# Patient Record
Sex: Female | Born: 1935
Health system: Southern US, Community
[De-identification: ages and names within clinical notes are randomized; demographics above are authoritative.]

## PROBLEM LIST (undated history)

## (undated) DIAGNOSIS — F32A Depression, unspecified: Secondary | ICD-10-CM

## (undated) DIAGNOSIS — K5900 Constipation, unspecified: Secondary | ICD-10-CM

## (undated) DIAGNOSIS — N3946 Mixed incontinence: Secondary | ICD-10-CM

## (undated) DIAGNOSIS — Z8489 Family history of other specified conditions: Secondary | ICD-10-CM

## (undated) DIAGNOSIS — K8020 Calculus of gallbladder without cholecystitis without obstruction: Secondary | ICD-10-CM

## (undated) DIAGNOSIS — Z923 Personal history of irradiation: Secondary | ICD-10-CM

## (undated) DIAGNOSIS — R350 Frequency of micturition: Secondary | ICD-10-CM

## (undated) DIAGNOSIS — F419 Anxiety disorder, unspecified: Secondary | ICD-10-CM

## (undated) DIAGNOSIS — M1990 Unspecified osteoarthritis, unspecified site: Secondary | ICD-10-CM

## (undated) DIAGNOSIS — R252 Cramp and spasm: Secondary | ICD-10-CM

## (undated) DIAGNOSIS — N393 Stress incontinence (female) (male): Secondary | ICD-10-CM

## (undated) DIAGNOSIS — M4800 Spinal stenosis, site unspecified: Secondary | ICD-10-CM

## (undated) DIAGNOSIS — E039 Hypothyroidism, unspecified: Secondary | ICD-10-CM

## (undated) DIAGNOSIS — F329 Major depressive disorder, single episode, unspecified: Secondary | ICD-10-CM

## (undated) HISTORY — DX: Anxiety disorder, unspecified: F41.9

## (undated) HISTORY — DX: Stress incontinence (female) (male): N39.3

## (undated) HISTORY — DX: Cramp and spasm: R25.2

## (undated) HISTORY — DX: Depression, unspecified: F32.A

## (undated) HISTORY — DX: Calculus of gallbladder without cholecystitis without obstruction: K80.20

## (undated) HISTORY — PX: COLONOSCOPY: SHX174

## (undated) HISTORY — DX: Unspecified osteoarthritis, unspecified site: M19.90

## (undated) HISTORY — DX: Major depressive disorder, single episode, unspecified: F32.9

## (undated) HISTORY — DX: Spinal stenosis, site unspecified: M48.00

## (undated) HISTORY — DX: Mixed incontinence: N39.46

## (undated) HISTORY — PX: EYE SURGERY: SHX253

## (undated) HISTORY — DX: Hypothyroidism, unspecified: E03.9

## (undated) HISTORY — DX: Frequency of micturition: R35.0

## (undated) HISTORY — PX: DILATION AND CURETTAGE OF UTERUS: SHX78

---

## 1998-04-19 ENCOUNTER — Emergency Department (HOSPITAL_COMMUNITY): Admission: EM | Admit: 1998-04-19 | Discharge: 1998-04-19 | Payer: Self-pay | Admitting: Emergency Medicine

## 1998-08-20 ENCOUNTER — Ambulatory Visit (HOSPITAL_COMMUNITY): Admission: RE | Admit: 1998-08-20 | Discharge: 1998-08-20 | Payer: Self-pay | Admitting: Obstetrics and Gynecology

## 1998-08-20 ENCOUNTER — Encounter: Payer: Self-pay | Admitting: Obstetrics and Gynecology

## 1998-09-16 ENCOUNTER — Other Ambulatory Visit: Admission: RE | Admit: 1998-09-16 | Discharge: 1998-09-16 | Payer: Self-pay | Admitting: Obstetrics and Gynecology

## 2000-06-22 ENCOUNTER — Encounter: Admission: RE | Admit: 2000-06-22 | Discharge: 2000-06-22 | Payer: Self-pay | Admitting: Family Medicine

## 2000-06-22 ENCOUNTER — Encounter: Payer: Self-pay | Admitting: Family Medicine

## 2001-01-01 ENCOUNTER — Ambulatory Visit (HOSPITAL_COMMUNITY): Admission: RE | Admit: 2001-01-01 | Discharge: 2001-01-01 | Payer: Self-pay | Admitting: Gastroenterology

## 2001-02-14 ENCOUNTER — Encounter: Admission: RE | Admit: 2001-02-14 | Discharge: 2001-02-14 | Payer: Self-pay | Admitting: Neurosurgery

## 2001-07-19 ENCOUNTER — Encounter: Payer: Self-pay | Admitting: Orthopaedic Surgery

## 2001-07-19 ENCOUNTER — Encounter: Admission: RE | Admit: 2001-07-19 | Discharge: 2001-07-19 | Payer: Self-pay | Admitting: Orthopaedic Surgery

## 2004-06-05 LAB — CONVERTED CEMR LAB: Pap Smear: NORMAL

## 2005-02-01 ENCOUNTER — Ambulatory Visit (HOSPITAL_COMMUNITY): Admission: RE | Admit: 2005-02-01 | Discharge: 2005-02-01 | Payer: Self-pay | Admitting: Neurosurgery

## 2005-03-24 ENCOUNTER — Encounter: Admission: RE | Admit: 2005-03-24 | Discharge: 2005-03-24 | Payer: Self-pay | Admitting: Neurosurgery

## 2006-08-27 ENCOUNTER — Encounter: Admission: RE | Admit: 2006-08-27 | Discharge: 2006-08-27 | Payer: Self-pay | Admitting: Neurosurgery

## 2006-10-04 HISTORY — PX: BACK SURGERY: SHX140

## 2007-02-22 ENCOUNTER — Ambulatory Visit: Payer: Self-pay | Admitting: Internal Medicine

## 2007-02-22 DIAGNOSIS — K5900 Constipation, unspecified: Secondary | ICD-10-CM | POA: Insufficient documentation

## 2007-02-22 DIAGNOSIS — M810 Age-related osteoporosis without current pathological fracture: Secondary | ICD-10-CM

## 2007-02-22 DIAGNOSIS — R252 Cramp and spasm: Secondary | ICD-10-CM

## 2007-02-22 DIAGNOSIS — E039 Hypothyroidism, unspecified: Secondary | ICD-10-CM

## 2007-02-22 DIAGNOSIS — M4800 Spinal stenosis, site unspecified: Secondary | ICD-10-CM

## 2007-02-22 HISTORY — DX: Cramp and spasm: R25.2

## 2007-03-01 ENCOUNTER — Encounter: Admission: RE | Admit: 2007-03-01 | Discharge: 2007-03-01 | Payer: Self-pay | Admitting: Internal Medicine

## 2007-03-07 ENCOUNTER — Encounter (INDEPENDENT_AMBULATORY_CARE_PROVIDER_SITE_OTHER): Payer: Self-pay | Admitting: *Deleted

## 2007-03-21 ENCOUNTER — Telehealth (INDEPENDENT_AMBULATORY_CARE_PROVIDER_SITE_OTHER): Payer: Self-pay | Admitting: *Deleted

## 2007-04-08 ENCOUNTER — Telehealth: Payer: Self-pay | Admitting: Internal Medicine

## 2007-06-10 ENCOUNTER — Encounter: Admission: RE | Admit: 2007-06-10 | Discharge: 2007-06-10 | Payer: Self-pay | Admitting: Neurosurgery

## 2007-07-11 ENCOUNTER — Encounter: Admission: RE | Admit: 2007-07-11 | Discharge: 2007-07-11 | Payer: Self-pay | Admitting: Neurosurgery

## 2007-10-23 ENCOUNTER — Encounter: Payer: Self-pay | Admitting: Internal Medicine

## 2007-10-29 ENCOUNTER — Ambulatory Visit: Payer: Self-pay | Admitting: Internal Medicine

## 2007-10-29 LAB — CONVERTED CEMR LAB: Blood Glucose, Fingerstick: 95

## 2007-11-05 ENCOUNTER — Telehealth (INDEPENDENT_AMBULATORY_CARE_PROVIDER_SITE_OTHER): Payer: Self-pay | Admitting: *Deleted

## 2007-11-05 LAB — CONVERTED CEMR LAB
Free T4: 1.1 ng/dL (ref 0.6–1.6)
T3 Uptake Ratio: 39.4 % — ABNORMAL HIGH (ref 22.5–37.0)
T3, Free: 2.2 pg/mL — ABNORMAL LOW (ref 2.3–4.2)
TSH: 4.58 microintl units/mL (ref 0.35–5.50)

## 2007-12-12 ENCOUNTER — Ambulatory Visit: Payer: Self-pay | Admitting: Internal Medicine

## 2007-12-12 DIAGNOSIS — M1990 Unspecified osteoarthritis, unspecified site: Secondary | ICD-10-CM | POA: Insufficient documentation

## 2007-12-17 LAB — CONVERTED CEMR LAB
ALT: 18 units/L (ref 0–35)
AST: 20 units/L (ref 0–37)
BUN: 21 mg/dL (ref 6–23)
Basophils Absolute: 0 10*3/uL (ref 0.0–0.1)
Basophils Relative: 0.5 % (ref 0.0–1.0)
CO2: 30 meq/L (ref 19–32)
Calcium: 9.4 mg/dL (ref 8.4–10.5)
Chloride: 103 meq/L (ref 96–112)
Cholesterol: 242 mg/dL (ref 0–200)
Creatinine, Ser: 0.8 mg/dL (ref 0.4–1.2)
Direct LDL: 149.3 mg/dL
Eosinophils Absolute: 0.2 10*3/uL (ref 0.0–0.7)
Eosinophils Relative: 4.8 % (ref 0.0–5.0)
GFR calc Af Amer: 90 mL/min
GFR calc non Af Amer: 75 mL/min
Glucose, Bld: 68 mg/dL — ABNORMAL LOW (ref 70–99)
HCT: 36.2 % (ref 36.0–46.0)
HDL: 78.7 mg/dL (ref >39.0)
Hemoglobin: 12.7 g/dL (ref 12.0–15.0)
Lymphocytes Relative: 26.5 % (ref 12.0–46.0)
MCHC: 34.9 g/dL (ref 30.0–36.0)
MCV: 92.8 fL (ref 78.0–100.0)
Monocytes Absolute: 0.4 10*3/uL (ref 0.1–1.0)
Monocytes Relative: 12.4 % — ABNORMAL HIGH (ref 3.0–12.0)
Neutro Abs: 2 10*3/uL (ref 1.4–7.7)
Neutrophils Relative %: 55.8 % (ref 43.0–77.0)
Platelets: 183 10*3/uL (ref 150–400)
Potassium: 4.5 meq/L (ref 3.5–5.1)
RBC: 3.91 M/uL (ref 3.87–5.11)
RDW: 12.7 % (ref 11.5–14.6)
Sodium: 141 meq/L (ref 135–145)
TSH: 3.54 microintl units/mL (ref 0.35–5.50)
Total CHOL/HDL Ratio: 3.1
Triglycerides: 42 mg/dL (ref 0–149)
VLDL: 8 mg/dL (ref 0–40)
WBC: 3.6 10*3/uL — ABNORMAL LOW (ref 4.5–10.5)

## 2007-12-23 ENCOUNTER — Ambulatory Visit: Payer: Self-pay | Admitting: Internal Medicine

## 2007-12-23 LAB — CONVERTED CEMR LAB
OCCULT 1: NEGATIVE
OCCULT 2: NEGATIVE
OCCULT 3: NEGATIVE

## 2007-12-24 ENCOUNTER — Encounter (INDEPENDENT_AMBULATORY_CARE_PROVIDER_SITE_OTHER): Payer: Self-pay | Admitting: *Deleted

## 2008-02-12 ENCOUNTER — Telehealth (INDEPENDENT_AMBULATORY_CARE_PROVIDER_SITE_OTHER): Payer: Self-pay | Admitting: *Deleted

## 2008-08-11 ENCOUNTER — Ambulatory Visit: Payer: Self-pay | Admitting: Internal Medicine

## 2008-08-11 LAB — CONVERTED CEMR LAB: Glucose, Bld: 109 mg/dL

## 2008-08-15 LAB — CONVERTED CEMR LAB: TSH: 2.29 microintl units/mL (ref 0.35–5.50)

## 2008-08-17 ENCOUNTER — Encounter (INDEPENDENT_AMBULATORY_CARE_PROVIDER_SITE_OTHER): Payer: Self-pay | Admitting: *Deleted

## 2009-06-02 ENCOUNTER — Telehealth (INDEPENDENT_AMBULATORY_CARE_PROVIDER_SITE_OTHER): Payer: Self-pay | Admitting: *Deleted

## 2009-08-09 ENCOUNTER — Ambulatory Visit: Payer: Self-pay | Admitting: Internal Medicine

## 2009-08-10 ENCOUNTER — Encounter: Payer: Self-pay | Admitting: Internal Medicine

## 2009-08-10 LAB — CONVERTED CEMR LAB: Vit D, 25-Hydroxy: 45 ng/mL (ref 30–89)

## 2009-08-12 LAB — CONVERTED CEMR LAB
BUN: 18 mg/dL (ref 6–23)
Basophils Absolute: 0 10*3/uL (ref 0.0–0.1)
Basophils Relative: 0 % (ref 0.0–3.0)
CO2: 31 meq/L (ref 19–32)
Calcium: 9.3 mg/dL (ref 8.4–10.5)
Chloride: 104 meq/L (ref 96–112)
Cholesterol: 219 mg/dL — ABNORMAL HIGH (ref 0–200)
Creatinine, Ser: 0.7 mg/dL (ref 0.4–1.2)
Direct LDL: 125.8 mg/dL
Eosinophils Absolute: 0.2 10*3/uL (ref 0.0–0.7)
Eosinophils Relative: 4.5 % (ref 0.0–5.0)
GFR calc non Af Amer: 86.48 mL/min (ref >60)
Glucose, Bld: 76 mg/dL (ref 70–99)
HCT: 35.7 % — ABNORMAL LOW (ref 36.0–46.0)
HDL: 79.6 mg/dL (ref >39.00)
Hemoglobin: 11.7 g/dL — ABNORMAL LOW (ref 12.0–15.0)
Lymphocytes Relative: 48.2 % — ABNORMAL HIGH (ref 12.0–46.0)
Lymphs Abs: 1.7 10*3/uL (ref 0.7–4.0)
MCHC: 32.9 g/dL (ref 30.0–36.0)
MCV: 93.9 fL (ref 78.0–100.0)
Monocytes Absolute: 0.7 10*3/uL (ref 0.1–1.0)
Monocytes Relative: 19.6 % — ABNORMAL HIGH (ref 3.0–12.0)
Neutro Abs: 1 10*3/uL — ABNORMAL LOW (ref 1.4–7.7)
Neutrophils Relative %: 27.7 % — ABNORMAL LOW (ref 43.0–77.0)
Platelets: 220 10*3/uL (ref 150.0–400.0)
Potassium: 4.2 meq/L (ref 3.5–5.1)
RBC: 3.8 M/uL — ABNORMAL LOW (ref 3.87–5.11)
RDW: 12.6 % (ref 11.5–14.6)
Sodium: 143 meq/L (ref 135–145)
TSH: 2.03 microintl units/mL (ref 0.35–5.50)
Total CHOL/HDL Ratio: 3
Triglycerides: 54 mg/dL (ref 0.0–149.0)
VLDL: 10.8 mg/dL (ref 0.0–40.0)
WBC: 3.6 10*3/uL — ABNORMAL LOW (ref 4.5–10.5)

## 2010-03-23 ENCOUNTER — Telehealth: Payer: Self-pay | Admitting: Internal Medicine

## 2010-03-25 ENCOUNTER — Ambulatory Visit: Payer: Self-pay | Admitting: Internal Medicine

## 2010-03-25 DIAGNOSIS — L989 Disorder of the skin and subcutaneous tissue, unspecified: Secondary | ICD-10-CM | POA: Insufficient documentation

## 2010-03-25 LAB — CONVERTED CEMR LAB
Hemoglobin: 12.1 g/dL (ref 12.0–15.0)
TSH: 2.943 microintl units/mL (ref 0.350–4.500)

## 2010-03-29 ENCOUNTER — Telehealth: Payer: Self-pay | Admitting: Internal Medicine

## 2010-04-13 ENCOUNTER — Encounter: Payer: Self-pay | Admitting: Internal Medicine

## 2010-06-15 ENCOUNTER — Encounter: Admission: RE | Admit: 2010-06-15 | Discharge: 2010-06-15 | Payer: Self-pay | Source: Home / Self Care

## 2010-06-23 ENCOUNTER — Encounter
Admission: RE | Admit: 2010-06-23 | Discharge: 2010-06-23 | Payer: Self-pay | Source: Home / Self Care | Attending: Neurosurgery | Admitting: Neurosurgery

## 2010-06-25 ENCOUNTER — Encounter: Payer: Self-pay | Admitting: Neurosurgery

## 2010-06-26 ENCOUNTER — Encounter: Payer: Self-pay | Admitting: Internal Medicine

## 2010-07-05 NOTE — Progress Notes (Signed)
Summary: HAS APPT FOR BONE DENSITY--NEEDS REFERRAL  Phone Note Call from Patient Call back at Home Phone [phone] Call back at Work Phone [phone]   Caller: Patient Summary of Call: PATIENT HAS MADE AN APPOINTMENT ON 03/31/2010 FOR A BONE DENSITY TEST AT [facility]--SHE HAS HAD BONE DENSITY TEST THERE BEFORE  SHE NEEDS REFERRAL FAXED AS SOON AS POSSIBLE TO--- "[facility]---  FAX # [phone]" Initial call taken by: Jerolyn Shin,  March 23, 2010 10:25 AM  Follow-up for Phone Call        REFERRAL SENT PER PT REQUEST. Follow-up by: Magdalen Spatz [facility],  March 25, 2010 4:09 PM

## 2010-07-05 NOTE — Assessment & Plan Note (Signed)
Summary: cpx/kdc   Vital Signs:  Patient profile:   77 year old female Height:      64 inches Weight:      141.2 pounds BMI:     24.32 Pulse rate:   64 / minute BP sitting:   110 / 60  Vitals Entered By: Shary Decamp (August 09, 2009 1:40 PM) CC: yearly - fasting, pt has concerns about synthorid & osteoporosis   History of Present Illness: OSTEOPENIA  -- takes Ca, Vit D and other OTCs  HYPOTHYROIDISM -- wonders if synthroid cause osteoporosis (no, only if TSH low)   stress incontinence (saw Urology 5-09)-- symptoms are about the same , was Rx therapy (exercises)  but did not go to the class  yearly - fasting, chart reviewed     Current Medications (verified): 1)  Synthroid 75 Mcg  Tabs (Levothyroxine Sodium) .Marland Kitchen.. 1 By Mouth Once Daily 2)  Qualaquin 324 Mg  Caps (Quinine Sulfate) .Marland Kitchen.. 1 By Mouth At Bedtime Prn 3)  Calcium/d  Allergies (verified): 1)  ! Percocet (Oxycodone-Acetaminophen) 2)  ! Codeine  Past History:  Past Medical History: OSTEOPENIA   HYPOTHYROIDISM  SPINAL STENOSIS  G5 P3 miscarriage x 2 borderline DM when she was age 47-47yo stress incontinence (saw Urology 5-09) Osteoarthritis  Past Surgical History: back surgery (5/08) - spinal stenosis DILATION AND CURETTAGE, HX OF   Family History: Reviewed history from 02/22/2007 and no changes required. Father: living 39  yo, lives independently  (has not seen a doctor in 4 years, has rarely seen a doctor) Mother: deceased - CHF, ?MI? breast ca--no colon cancer--no DM-- no  Social History: Married 3 kids tobacco-- never ETOH-- rarely  diet-- healthy for the most part , does like  sweets Exercise-- 3 times a week, goes to the gym Retired  Review of Systems General:  Denies fever and weight loss. CV:  Denies chest pain or discomfort and swelling of feet. Resp:  Denies cough and shortness of breath. GI:  Denies bloody stools, diarrhea, nausea, and vomiting. GU:  Denies dysuria and  hematuria. Psych:  Denies anxiety and depression.  Physical Exam  General:  alert, well-developed, and well-nourished.   Neck:  no masses and no thyromegaly.   Lungs:  normal respiratory effort, no intercostal retractions, no accessory muscle use, and normal breath sounds.   Heart:  normal rate, regular rhythm, no murmur, and no gallop.   Abdomen:  soft, non-tender, no distention, no masses, no guarding, and no rigidity.   Extremities:  no edema Psych:  Cognition and judgment appear intact. Alert and cooperative with normal attention span and concentration, not anxious appearing and not depressed appearing.     Impression & Recommendations:  Problem # 1:  ? of HYPERGLYCEMIA (ICD-790.29) at some point in the past she was told she had borderline diabetes, labs  Orders: TLB-BMP (Basic Metabolic Panel-BMET) (80048-METABOL)  Problem # 2:  HEALTH SCREENING (ICD-V70.0) Td 2003 pneumonia shot 1998 and today not ready for a shingles shot  Last Colonoscopy:  06/05/1998--actual report not available, pt was told it was normal Hemoccults neg 12/2007 pt desires to repeat a Cscope: refer to Dr Loreta Ave   was refered to gyn before , did not go [facility]   02/2007 Pap Smear:  normal (06/05/2004)  she does SBE plan:  refer to gyn (Dr Stefano Gaul) and [facility]  praised for her healthy lifestyle    Orders: Gastroenterology Referral (GI) Radiology Referral (Radiology) Gynecologic Referral (Gyn)  Problem # 3:  OSTEOPENIA (  ICD-733.90)  no recent bone density test, referral for a DEXA  Orders: TLB-CBC Platelet - w/Differential (85025-CBCD) T-Vitamin D (25-Hydroxy) (04540-98119) Radiology Referral (Radiology)  Problem # 4:  HYPOTHYROIDISM (ICD-244.9) labs  Her updated medication list for this problem includes:    Synthroid 75 Mcg Tabs (Levothyroxine sodium) .Marland Kitchen... 1 by mouth once daily  Orders: Venipuncture (14782) TLB-Lipid Panel (80061-LIPID) TLB-TSH (Thyroid Stimulating Hormone)  (84443-TSH)  Problem # 5:  f/u TSH 6 months and OV yearly   Complete Medication List: 1)  Synthroid 75 Mcg Tabs (Levothyroxine sodium) .Marland Kitchen.. 1 by mouth once daily 2)  Qualaquin 324 Mg Caps (Quinine sulfate) .Marland Kitchen.. 1 by mouth at bedtime prn 3)  Calcium/d   Other Orders: Pneumococcal Vaccine (95621) Admin 1st Vaccine (30865)  Patient Instructions: 1)  Please schedule a follow-up appointment in 6 months (labs only)     Preventive Care Screening  Prior Values:    Pap Smear:  normal (06/05/2004)    Mammogram:  normal (06/05/1996)    Colonoscopy:  normal (06/05/1998)    Bone Density:  abnormal (06/05/2004)    Last Tetanus Booster:  Historical (06/05/2001)    Last Pneumovax:  Historical (06/05/1996)    Dexa Interp:  abnormal (06/05/2004)    Past Medical History:    Reviewed history from 12/12/2007 and no changes required:       OSTEOPENIA         HYPOTHYROIDISM        SPINAL STENOSIS        G5 P3 miscarriage x 2       borderline DM when she was age [age]yo       stress incontinence (saw Urology 5-09)       Osteoarthritis  Past Surgical History:    Reviewed history from 10/29/2007 and no changes required:       back surgery (5/08) - spinal stenosis       DILATION AND CURETTAGE, HX OF     Immunizations Administered:  Pneumonia Vaccine:    Vaccine Type: Pneumovax (Medicare)    Site: right deltoid    Mfr: Merck    Dose: 0.5 ml    Route: IM    Given by: Shary Decamp    Exp. Date: 09/23/2010    Lot #: [id number]

## 2010-07-05 NOTE — Assessment & Plan Note (Signed)
Summary: FOLLOWUP, GET THYROID CHECKED///SPH   Vital Signs:  Patient profile:   77 year old female Weight:      139.13 pounds Pulse rate:   76 / minute Pulse rhythm:   regular BP sitting:   126 / 84  (left arm) Cuff size:   regular  Vitals Entered By: Army Fossa CMA (March 25, 2010 2:56 PM) CC: Pt here for f/u visit- check TSH Comments not fasting flu shot  [facility]   History of Present Illness: ROS here for a checkup Doing well  ROS Since May, her sciatic pain has resurfaced, she contacted her neurosurgeon but has not seen him yet, Dr. Channing Mutters; The pain is not severe enough for her to  consider surgery or an MRI, she is taking Advil.  She was referred to gynecology, she had a visit with Dr Stefano Gaul, they did recommend a mammogram, patient is quite reluctant to have a mammogram. She reports a history of 3 previous mammograms in her lifetime. Afraid of radiation.  a skin lesion in her leg has changed.    Current Medications (verified): 1)  Synthroid 75 Mcg  Tabs (Levothyroxine Sodium) .Marland Kitchen.. 1 By Mouth Once Daily 2)  Qualaquin 324 Mg  Caps (Quinine Sulfate) .Marland Kitchen.. 1 By Mouth At Bedtime Prn 3)  Calcium/d  Allergies (verified): 1)  ! Percocet (Oxycodone-Acetaminophen) 2)  ! Codeine  Past History:  Past Medical History: Reviewed history from 08/09/2009 and no changes required. OSTEOPENIA   HYPOTHYROIDISM  SPINAL STENOSIS  G5 P3 miscarriage x 2 borderline DM when she was age [age]yo stress incontinence (saw Urology 5-09) Osteoarthritis  Past Surgical History: Reviewed history from 08/09/2009 and no changes required. back surgery (5/08) - spinal stenosis DILATION AND CURETTAGE, HX OF   Family History: Reviewed history from 08/09/2009 and no changes required. Father: living 79  yo, lives independently  (has not seen a doctor in 4 years, has rarely seen a doctor) Mother: deceased - CHF, ?MI? breast ca--no colon cancer--no DM-- no  Social History: Reviewed  history from 08/09/2009 and no changes required. Married 3 kids tobacco-- never ETOH-- rarely  diet-- healthy for the most part , does like  sweets Exercise-- 3 times a week, goes to the gym Retired  Physical Exam  General:  alert, well-developed, and well-nourished.   Lungs:  normal respiratory effort, no intercostal retractions, no accessory muscle use, and normal breath sounds.   Heart:  normal rate, regular rhythm, no murmur, and no gallop.   Extremities:  no edema Skin:  has several hyperpigmented lesions in the leg, she points to one in the right leg that is about 1 cm, oval in and shape , it  is slightly raised it and scaly.   Impression & Recommendations:  Problem # 1:  HYPOTHYROIDISM (ICD-244.9)  Her updated medication list for this problem includes:    Synthroid 75 Mcg Tabs (Levothyroxine sodium) .Marland Kitchen... 1 by mouth once daily  Labs Reviewed: TSH: 2.03 (08/09/2009)    Chol: 219 (08/09/2009)   HDL: 79.60 (08/09/2009)   LDL: DEL (12/12/2007)   TG: 54.0 (08/09/2009)  Orders: Venipuncture (14481) Specimen Handling (85631)  Problem # 2:  SPINAL STENOSIS (ICD-724.00) see ROS Recommend to contact her neurosurgeon if the pain increases  Problem # 3:  SKIN LESION (ICD-709.9) previously flat skin lesion in the right leg is now raised. We agreed that she will see her dermatologist  Problem # 4:  HEALTH SCREENING (ICD-V70.0) we contacted Dr. Loreta Ave, she is due for her next colonoscopy  in 2012 Her last hemoglobin was slightly decreased, will recheck She saw her gynecologist, he did  recommended a mammogram. Patient reluctant. I again explained the benefits of a mammogram.  Complete Medication List: 1)  Synthroid 75 Mcg Tabs (Levothyroxine sodium) .Marland Kitchen.. 1 by mouth once daily 2)  Qualaquin 324 Mg Caps (Quinine sulfate) .Marland Kitchen.. 1 by mouth at bedtime prn 3)  Calcium/d   Other Orders: Flu Vaccine 25yrs + MEDICARE PATIENTS (N0272) Administration Flu vaccine - MCR (Z3664)  Patient  Instructions: 1)  Please schedule a follow-up appointment in 6 months .    Orders Added: 1)  Flu Vaccine 75yrs + MEDICARE PATIENTS [Q2039] 2)  Administration Flu vaccine - MCR [G0008] 3)  Venipuncture [40347] 4)  Specimen Handling [99000] 5)  Est. Patient Level III [42595] Flu Vaccine Consent Questions     Do you have a history of severe allergic reactions to this vaccine? no    Any prior history of allergic reactions to egg and/or gelatin? no    Do you have a sensitivity to the preservative Thimersol? no    Do you have a past history of Guillan-Barre Syndrome? no    Do you currently have an acute febrile illness? no    Have you ever had a severe reaction to latex? no    Vaccine information given and explained to patient? yes    Are you currently pregnant? no    Lot Number:[id number]   Exp Date:12/03/2010   Site Given  Left Deltoid IMine 43yrs + MEDICARE PATIENTS [Q2039] 2)  Administration Flu vaccine - MCR [G0008]     .[email or web address]

## 2010-07-05 NOTE — Progress Notes (Signed)
Summary: LAB TEST RESULTS AND REFILL  Phone Note Call from Patient Call back at Home Phone [phone]   Caller: Patient Summary of Call: CALLED ABOUT LAST LAB TEST RESULTS--DOES SHE NEED NEW PRESCRIPTION FOR THYROID?      1) CALL HER ABOUT RESULTS 2) PLEASE SEND A PRESCRIPTION REQUEST FOR HER THYROID MEDICATION TO [facility] FOR 90 DAYS PLUS REFILLS Initial call taken by: Jerolyn Shin,  March 29, 2010 12:35 PM  Follow-up for Phone Call        Please advise. Lucious Groves CMA  March 29, 2010 2:23 PM   Additional Follow-up for Phone Call Additional follow up Details #1::        hemoglobin stable, improved compared to last time. TSH normal, continue with same Synthroid dose. call refills if needed Additional Follow-up by: Dorine Duffey E. Kandi Brusseau MD,  March 29, 2010 4:06 PM    Additional Follow-up for Phone Call Additional follow up Details #2::    Patient notified and mailed copy per request. Follow-up by: Lucious Groves CMA,  March 29, 2010 4:36 PM  Prescriptions: SYNTHROID 75 MCG  TABS (LEVOTHYROXINE SODIUM) 1 by mouth once daily  #90 x 1   Entered by:   Lucious Groves CMA   Authorized by:   Nolon Rod. Azim Gillingham MD   Signed by:   Lucious Groves CMA on 03/29/2010   Method used:   Faxed to ...       [facility]* (retail)             ,          Ph: [phone]       Fax: [phone]   RxID:   [id number]

## 2010-07-05 NOTE — Miscellaneous (Signed)
Summary: Orders Update   Clinical Lists Changes  Orders: Added new Referral order of Radiology Referral (Radiology) - Signed 

## 2010-07-09 ENCOUNTER — Encounter: Payer: Self-pay | Admitting: Neurosurgery

## 2010-10-10 ENCOUNTER — Other Ambulatory Visit: Payer: Self-pay | Admitting: Internal Medicine

## 2010-10-21 NOTE — Procedures (Signed)
[place]  Patient:    Marie Haynes, Marie Haynes                        MRN: [id number] Proc. Date: 01/01/01 Adm. Date:  [id number] Attending:  Charna Elizabeth CC:         Juluis Mire, M.D.   Procedure Report  DATE OF BIRTH:  [birth year].  REFERRING PHYSICIAN:  Juluis Mire, M.D.  PROCEDURE PERFORMED:  Colonoscopy.  ENDOSCOPIST:  Anselmo Rod, M.D.  INSTRUMENT USED:  Olympus video pediatric colonoscope.  INDICATIONS FOR PROCEDURE:  Change in bowel habits with guaiac positive stools and worsening constipation in a 77 year old white female.  Rule out colonic polyps, masses, hemorrhoids, etc.  PREPROCEDURE PREPARATION:  Informed consent was procured from the patient. The patient was fasted for eight hours prior to the procedure and prepped with a bottle of magnesium citrate and a gallon of NuLytely the night prior to the procedure.  PREPROCEDURE PHYSICAL:  The patient had stable vital signs.  Neck supple. Chest clear to auscultation.  S1, S2 regular.  Abdomen soft with normal bowel sounds.  DESCRIPTION OF PROCEDURE:  The patient was placed in the left lateral decubitus position and sedated with 40 mg of Demerol and 4 mg of Versed intravenously.  Once the patient was adequately sedated and maintained on low-flow oxygen and continuous cardiac monitoring, the Olympus video colonoscope was advanced from the rectum to the cecum without difficulty.  The patient had a fairly good prep.  There was evidence of melanosis coli throughout the colon with more prominent changes on the right side.  No masses, polyps, erosions, ulcerations or diverticula were seen.  The procedure was completed up to the cecum.  The ileocecal valve and appendiceal orifice were clearly visualized and photographed.  Small nonbleeding internal hemorrhoids were appreciated on retroflexion in the rectum.  IMPRESSION: 1. Severe melanosis coli throughout the colon with more  prominent changes in    the right colon than the left. 2. Small nonbleeding internal hemorrhoids. 3. No evidence of masses, polyps or diverticulosis.  RECOMMENDATIONS: 1. A high fiber diet has been discussed with the patient in great detail. 2. She has been strongly advised to refrain from the use of all laxatives and    to use stool softeners if need be. 3. Outpatient follow-up for repeat guaiac testing. 4. Further recommendations to be made in follow-up in the next four weeks. DD: 01/01/01 TD:  01/01/01 Job: [id number]

## 2010-11-01 ENCOUNTER — Encounter: Payer: Self-pay | Admitting: Internal Medicine

## 2010-11-01 ENCOUNTER — Ambulatory Visit (INDEPENDENT_AMBULATORY_CARE_PROVIDER_SITE_OTHER): Payer: Medicare Other | Admitting: Internal Medicine

## 2010-11-01 DIAGNOSIS — W57XXXA Bitten or stung by nonvenomous insect and other nonvenomous arthropods, initial encounter: Secondary | ICD-10-CM

## 2010-11-01 DIAGNOSIS — J209 Acute bronchitis, unspecified: Secondary | ICD-10-CM

## 2010-11-01 DIAGNOSIS — T148 Other injury of unspecified body region: Secondary | ICD-10-CM

## 2010-11-01 MED ORDER — BENZONATATE 200 MG PO CAPS: 200.0000 mg | ORAL_CAPSULE | Freq: Three times a day (TID) | ORAL | Status: DC | PRN

## 2010-11-01 NOTE — Progress Notes (Signed)
  Subjective:    Patient ID: Marie Haynes, female    DOB: 1931-12-20, 77 y.o.   MRN: [id number]  HPI Respiratory tract infection Onset/symptoms:5/25 as rhinitis & fatigue Exposures (illness/environmental/extrinsic):husband had "cold" Progression of symptoms:cough with thick , cloudy sputum Treatments/response:Doxycycline 05/27 for imbedded tick  Present symptoms:ST from cough Fever/chills/sweats:some chills & sweating Frontal headache:no Facial pain:no Nasal purulence:no Dental pain:no Lymphadenopathy:no Wheezing/shortness of breath:no Pleuritic pain:no Associated extrinsic/allergic symptoms:itchy eyes/ sneezing:only @ onset Past medical history: Seasonal allergies/asthma:asthmatic bronchitis  1991 Smoking history:never           Review of Systems     Objective:   Physical Exam General appearance is of good health and nourishment; no acute distress or increased work of breathing is present.  No  lymphadenopathy about the head, neck, or axilla noted.   Eyes: No conjunctival inflammation or lid edema is present. There is no scleral icterus.  Ears:  External ear exam shows no significant lesions or deformities.  Otoscopic examination reveals clear canals, tympanic membranes are intact bilaterally without bulging, retraction, inflammation or discharge.  Nose:  External nasal examination shows no deformity or inflammation. Nasal mucosa are pink and moist without lesions or exudates. No septal dislocation or dislocation.No obstruction to airflow.   Oral exam: Dental hygiene is good; lips and gums are healthy appearing.There is no oropharyngeal erythema or exudate noted.   Neck:  No deformities, thyromegaly, masses, or tenderness noted.    Decreased  range of motion from DDD  Heart:  Normal rate and regular rhythm. S1 and S2 normal without gallop, murmur, click, rub or other extra sounds.   Lungs:Chest clear to auscultation; no wheezes, rhonchi,rales ,or rubs present.No  increased work of breathing.    Extremities:  No cyanosis, edema, or clubbing  noted    Skin: Warm & dry w/o jaundice or tenting.         Assessment & Plan:  #1 bronchitis  #2 status post tick bite  Plan: #1 Tessalon pearls 2 mg every 6 hours as needed. Zicam Melts for the throat symptoms

## 2010-11-01 NOTE — Patient Instructions (Signed)
Zicam Melts as needed for sore throat. Force non dairy fluids over 48 hrs. Avoid direct sun while on Doxycycline

## 2010-11-09 ENCOUNTER — Telehealth: Payer: Self-pay

## 2010-11-09 MED ORDER — AZITHROMYCIN 250 MG PO TABS: 250.0000 mg | ORAL_TABLET | Freq: Every day | ORAL | Status: AC

## 2010-11-09 MED ORDER — PREDNISONE 20 MG PO TABS: ORAL_TABLET | ORAL | Status: DC

## 2010-11-09 NOTE — Telephone Encounter (Signed)
Spoke w/ pt husband aware of instructions and rx sent to pharmacy

## 2010-11-09 NOTE — Telephone Encounter (Signed)
Pt husband called would like to know what else says wife still has productive cough and hurts to cough was seen 5/29 for bronchitis was already doxy for another issue finished atb 3 days ago and still having hard time w/ cough.   Hop pls advise

## 2010-11-09 NOTE — Telephone Encounter (Signed)
Zpack #1; renew tessalon if needed ; Prednisone 20 mg 1/2 tid #12. CXray  & CBC& dif if no better

## 2011-02-27 ENCOUNTER — Other Ambulatory Visit: Payer: Self-pay | Admitting: Internal Medicine

## 2011-02-27 NOTE — Telephone Encounter (Signed)
Done

## 2011-03-20 ENCOUNTER — Other Ambulatory Visit: Payer: Self-pay | Admitting: Internal Medicine

## 2011-03-20 NOTE — Telephone Encounter (Signed)
Qualaquin request [last refill 02/15/10 #30x2]

## 2011-03-29 NOTE — Telephone Encounter (Signed)
Ok #30, 1 RF Please arrange a OV, due for a CPX

## 2011-05-19 ENCOUNTER — Encounter: Payer: Self-pay | Admitting: Family Medicine

## 2011-05-19 ENCOUNTER — Ambulatory Visit (INDEPENDENT_AMBULATORY_CARE_PROVIDER_SITE_OTHER): Payer: Medicare Other | Admitting: Family Medicine

## 2011-05-19 VITALS — BP 125/75 | HR 80 | Temp 99.0°F | Ht 63.25 in | Wt 135.0 lb

## 2011-05-19 DIAGNOSIS — J111 Influenza due to unidentified influenza virus with other respiratory manifestations: Secondary | ICD-10-CM | POA: Insufficient documentation

## 2011-05-19 MED ORDER — CHLORPHENIRAMINE-HYDROCODONE 8-10 MG/5ML PO LQCR: 5.0000 mL | Freq: Two times a day (BID) | ORAL | Status: DC | PRN

## 2011-05-19 MED ORDER — OSELTAMIVIR PHOSPHATE 75 MG PO CAPS: 75.0000 mg | ORAL_CAPSULE | Freq: Two times a day (BID) | ORAL | Status: AC

## 2011-05-19 NOTE — Progress Notes (Signed)
  Subjective:    Patient ID: Marie Haynes, female    DOB: [birth year], 77 y.o.   MRN: [id number]  HPI Flu like sxs- sxs started Wednesday night w/ 'cold sxs'.  Yesterday developed body aches, HA, burning eyes.  + cough- dry.  + chills.  Denies sick contacts.  Bilateral ear fullness.  + maxillary sinus pressure.   Review of Systems For ROS see HPI     Objective:   Physical Exam  Constitutional: She appears well-developed and well-nourished. No distress.  HENT:  Head: Normocephalic and atraumatic.       TMs normal bilaterally Mild nasal congestion Throat w/out erythema, edema, or exudate No TTP over sinuses  Eyes: Conjunctivae and EOM are normal. Pupils are equal, round, and reactive to light.  Neck: Normal range of motion. Neck supple.  Cardiovascular: Normal rate, regular rhythm, normal heart sounds and intact distal pulses.   No murmur heard. Pulmonary/Chest: Effort normal and breath sounds normal. No respiratory distress. She has no wheezes.       + dry cough  Lymphadenopathy:    She has no cervical adenopathy.          Assessment & Plan:

## 2011-05-19 NOTE — Patient Instructions (Signed)
You have the Flu Start the Tamiflu tonight Use the Tussionex for cough Drink plenty of fluids REST! Hang in there! Happy Holidays!

## 2011-05-21 NOTE — Assessment & Plan Note (Signed)
+   flu test.  No current respiratory distress.  Start Tamiflu.  Reviewed supportive care and red flags that should prompt return.  Pt expressed understanding and is in agreement w/ plan.

## 2011-05-22 LAB — POCT INFLUENZA A/B
Influenza A, POC: POSITIVE
Influenza B, POC: POSITIVE

## 2011-05-31 ENCOUNTER — Encounter (HOSPITAL_BASED_OUTPATIENT_CLINIC_OR_DEPARTMENT_OTHER): Payer: Self-pay | Admitting: *Deleted

## 2011-05-31 ENCOUNTER — Emergency Department (HOSPITAL_BASED_OUTPATIENT_CLINIC_OR_DEPARTMENT_OTHER)
Admission: EM | Admit: 2011-05-31 | Discharge: 2011-06-01 | Disposition: A | Discharge status: Home / Self Care | Payer: Medicare Other | Attending: Emergency Medicine | Admitting: Emergency Medicine

## 2011-05-31 DIAGNOSIS — M542 Cervicalgia: Secondary | ICD-10-CM | POA: Insufficient documentation

## 2011-05-31 DIAGNOSIS — Y92410 Unspecified street and highway as the place of occurrence of the external cause: Secondary | ICD-10-CM | POA: Insufficient documentation
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
## 2011-06-08 DIAGNOSIS — L821 Other seborrheic keratosis: Secondary | ICD-10-CM | POA: Diagnosis not present

## 2011-06-08 DIAGNOSIS — D239 Other benign neoplasm of skin, unspecified: Secondary | ICD-10-CM | POA: Diagnosis not present

## 2011-06-08 DIAGNOSIS — L609 Nail disorder, unspecified: Secondary | ICD-10-CM | POA: Diagnosis not present

## 2011-06-08 DIAGNOSIS — D485 Neoplasm of uncertain behavior of skin: Secondary | ICD-10-CM | POA: Diagnosis not present

## 2011-06-09 DIAGNOSIS — M542 Cervicalgia: Secondary | ICD-10-CM | POA: Diagnosis not present

## 2011-06-09 DIAGNOSIS — M25519 Pain in unspecified shoulder: Secondary | ICD-10-CM | POA: Diagnosis not present

## 2011-06-13 DIAGNOSIS — M25519 Pain in unspecified shoulder: Secondary | ICD-10-CM | POA: Diagnosis not present

## 2011-06-13 DIAGNOSIS — M542 Cervicalgia: Secondary | ICD-10-CM | POA: Diagnosis not present

## 2011-06-14 DIAGNOSIS — M25519 Pain in unspecified shoulder: Secondary | ICD-10-CM | POA: Diagnosis not present

## 2011-06-14 DIAGNOSIS — M542 Cervicalgia: Secondary | ICD-10-CM | POA: Diagnosis not present

## 2011-06-19 ENCOUNTER — Ambulatory Visit: Payer: Medicare Other

## 2011-06-20 DIAGNOSIS — M545 Low back pain: Secondary | ICD-10-CM | POA: Diagnosis not present

## 2011-06-20 DIAGNOSIS — M542 Cervicalgia: Secondary | ICD-10-CM | POA: Diagnosis not present

## 2011-06-21 DIAGNOSIS — M545 Low back pain: Secondary | ICD-10-CM | POA: Diagnosis not present

## 2011-06-22 DIAGNOSIS — M542 Cervicalgia: Secondary | ICD-10-CM | POA: Diagnosis not present

## 2011-06-22 DIAGNOSIS — L738 Other specified follicular disorders: Secondary | ICD-10-CM | POA: Diagnosis not present

## 2011-06-22 DIAGNOSIS — M545 Low back pain: Secondary | ICD-10-CM | POA: Diagnosis not present

## 2011-06-23 ENCOUNTER — Encounter: Payer: Self-pay | Admitting: Internal Medicine

## 2011-06-23 ENCOUNTER — Ambulatory Visit (INDEPENDENT_AMBULATORY_CARE_PROVIDER_SITE_OTHER): Payer: Medicare Other | Admitting: Internal Medicine

## 2011-06-23 VITALS — BP 118/70 | HR 75 | Temp 98.2°F | Wt 132.0 lb

## 2011-06-23 DIAGNOSIS — F329 Major depressive disorder, single episode, unspecified: Secondary | ICD-10-CM

## 2011-06-23 DIAGNOSIS — F341 Dysthymic disorder: Secondary | ICD-10-CM | POA: Diagnosis not present

## 2011-06-23 DIAGNOSIS — E039 Hypothyroidism, unspecified: Secondary | ICD-10-CM | POA: Diagnosis not present

## 2011-06-23 LAB — TSH: TSH: 1.951 u[IU]/mL (ref 0.350–4.500)

## 2011-06-23 MED ORDER — FLUOXETINE HCL 20 MG PO TABS: 20.0000 mg | ORAL_TABLET | Freq: Every day | ORAL | Status: DC

## 2011-06-23 NOTE — Assessment & Plan Note (Signed)
Labs , RF

## 2011-06-23 NOTE — Progress Notes (Signed)
  Subjective:    Patient ID: Marie Haynes, female    DOB: 30-Apr-1932, 78 y.o.   MRN: [id number]  HPI ROV Here for a hypothyroidism checkup. Good medication compliance. Needs labs. Also, complaining of anxiety and depression. This is going on  for a while, anxiety is episodic, usually once a week, symptoms are not severe but she becomes very irritable and snap at her husband. Depression is described as mild to moderate, feeling blue most days. She reports she knows exactly why she feels that way, has some issues with her children behavior and lifestyle. Has gone through psychotherapy before.  Past Medical History: Hypothyroidism Anxiety and depression   Osteopenia    Spinal stenosis G5 P3 miscarriage x 2 borderline DM when she was age [age]yo stress incontinence (saw Urology 5-09) Osteoarthritis  Past Surgical History: back surgery (5/08) - spinal stenosis S/P D/C   Social History: Married, 3 kids tobacco-- never ETOH-- rarely   Review of Systems No suicidal ideas. Good compliance with Fosamax without any dysphasia or chest pain. Occasionally feels fatigued during the day. No problems with insomnia.      Objective:   Physical Exam  Constitutional: She is oriented to person, place, and time. She appears well-developed and well-nourished.  HENT:  Head: Normocephalic and atraumatic.  Neck: No thyromegaly present.  Cardiovascular: Normal rate and regular rhythm.   No murmur heard. Pulmonary/Chest: Effort normal and breath sounds normal. No respiratory distress. She has no wheezes. She has no rales.  Musculoskeletal: She exhibits no edema.  Neurological: She is alert and oriented to person, place, and time.  Psychiatric: She has a normal mood and affect. Her behavior is normal. Judgment and thought content normal.      Assessment & Plan:  Today , I spent more than 25 min with the patient, >50% of the time counseling, see assessment and plan

## 2011-06-23 NOTE — Assessment & Plan Note (Addendum)
New problem, c/o episodic anxiety, depression described as mild to moderate We discussed different modalities of treatment including counseling, benzos prn and SSRIs Pt elected SSRI, i think prozac is a good choice as she is slt fatigue during the day time and that may be a sign of depression. She is also counseled about the issues that make her sad-anxious  Encouraged to restart counseling  Reassess in 6 weeks

## 2011-06-23 NOTE — Patient Instructions (Signed)
Fluoxetine 20 mg: 1/2 tablet a day x 10 days, then 1 tablet a day Counseling!

## 2011-06-25 ENCOUNTER — Encounter: Payer: Self-pay | Admitting: Internal Medicine

## 2011-06-27 ENCOUNTER — Encounter: Payer: Self-pay | Admitting: Internal Medicine

## 2011-06-27 DIAGNOSIS — M542 Cervicalgia: Secondary | ICD-10-CM | POA: Diagnosis not present

## 2011-06-27 DIAGNOSIS — M545 Low back pain: Secondary | ICD-10-CM | POA: Diagnosis not present

## 2011-06-29 DIAGNOSIS — M542 Cervicalgia: Secondary | ICD-10-CM | POA: Diagnosis not present

## 2011-06-29 DIAGNOSIS — M545 Low back pain: Secondary | ICD-10-CM | POA: Diagnosis not present

## 2011-07-04 DIAGNOSIS — M542 Cervicalgia: Secondary | ICD-10-CM | POA: Diagnosis not present

## 2011-07-05 DIAGNOSIS — H2640 Unspecified secondary cataract: Secondary | ICD-10-CM | POA: Diagnosis not present

## 2011-07-07 DIAGNOSIS — M25519 Pain in unspecified shoulder: Secondary | ICD-10-CM | POA: Diagnosis not present

## 2011-07-07 DIAGNOSIS — M542 Cervicalgia: Secondary | ICD-10-CM | POA: Diagnosis not present

## 2011-07-11 DIAGNOSIS — M542 Cervicalgia: Secondary | ICD-10-CM | POA: Diagnosis not present

## 2011-07-11 DIAGNOSIS — M545 Low back pain: Secondary | ICD-10-CM | POA: Diagnosis not present

## 2011-07-13 DIAGNOSIS — M545 Low back pain: Secondary | ICD-10-CM | POA: Diagnosis not present

## 2011-07-13 DIAGNOSIS — M542 Cervicalgia: Secondary | ICD-10-CM | POA: Diagnosis not present

## 2011-08-04 ENCOUNTER — Ambulatory Visit (INDEPENDENT_AMBULATORY_CARE_PROVIDER_SITE_OTHER): Payer: Medicare Other | Admitting: Internal Medicine

## 2011-08-04 VITALS — BP 120/76 | HR 61 | Temp 97.9°F | Wt 131.0 lb

## 2011-08-04 DIAGNOSIS — F341 Dysthymic disorder: Secondary | ICD-10-CM

## 2011-08-04 DIAGNOSIS — F329 Major depressive disorder, single episode, unspecified: Secondary | ICD-10-CM

## 2011-08-04 MED ORDER — ESCITALOPRAM OXALATE 10 MG PO TABS: 15.0000 mg | ORAL_TABLET | Freq: Every day | ORAL | Status: DC

## 2011-08-04 NOTE — Progress Notes (Signed)
  Subjective:    Patient ID: Marie Haynes, female    DOB: Oct 22, 1931, 78 y.o.   MRN: [id number]  HPI Followup from previous visit, she reported anxiety and depression. Was started on Prozac. Good compliance, denies any side effects however it has not changed her symptoms at all.   Past Medical History:  Hypothyroidism  Anxiety and depression  Osteopenia  Spinal stenosis  G5 P3 miscarriage x 2  borderline DM when she was age [age]yo  stress incontinence (saw Urology 5-09)  Osteoarthritis  Past Surgical History:  back surgery (5/08) - spinal stenosis  S/P D/C  Social History:  Married, 3 kids  tobacco-- never  ETOH-- rarely    Review of Systems Continue with on and off anxiety- depression, mild fatigue.no suicidal ideas  Denies any nausea, vomiting or diarrhea. Does not feel too sleepy or too awake.    Objective:   Physical Exam  Alert oriented x3, no apparent distress. Psych: No evidence of a site of depression. Coherent, cooperative      Assessment & Plan:

## 2011-08-04 NOTE — Patient Instructions (Signed)
lexapro 10 mg 1 a day x 1 week, then 1.5 tabs a day Came back in 2 months  Call if problems

## 2011-08-06 ENCOUNTER — Encounter: Payer: Self-pay | Admitting: Internal Medicine

## 2011-08-06 NOTE — Assessment & Plan Note (Signed)
Patient started on Prozac for anxiety-depression, she experienced no side effects but also has not improved at all. Options are to increase the Prozac dose or change to another medication. Because she has not improved at all with 20 mg of Prozac I  recommend a different agent.  Prescription for Lexapro provided. She knows to call me if  side effects. If she does not respond well to the second SSRI, we'll have to reconsider our diagnosis and consider a psych referral

## 2011-08-28 DIAGNOSIS — F411 Generalized anxiety disorder: Secondary | ICD-10-CM | POA: Diagnosis not present

## 2011-08-28 DIAGNOSIS — F918 Other conduct disorders: Secondary | ICD-10-CM | POA: Diagnosis not present

## 2011-09-04 ENCOUNTER — Other Ambulatory Visit: Payer: Self-pay | Admitting: Internal Medicine

## 2011-09-04 NOTE — Telephone Encounter (Signed)
Refill done.  

## 2011-10-04 ENCOUNTER — Ambulatory Visit: Payer: Medicare Other | Admitting: Internal Medicine

## 2011-11-10 ENCOUNTER — Other Ambulatory Visit: Payer: Self-pay | Admitting: Internal Medicine

## 2011-11-10 NOTE — Telephone Encounter (Signed)
Refill done.  

## 2011-12-27 ENCOUNTER — Encounter: Payer: Self-pay | Admitting: Internal Medicine

## 2011-12-27 ENCOUNTER — Other Ambulatory Visit: Payer: Medicare Other

## 2011-12-27 ENCOUNTER — Ambulatory Visit (INDEPENDENT_AMBULATORY_CARE_PROVIDER_SITE_OTHER): Payer: Medicare Other | Admitting: Internal Medicine

## 2011-12-27 VITALS — BP 110/74 | HR 64 | Temp 97.9°F | Wt 135.0 lb

## 2011-12-27 DIAGNOSIS — R5383 Other fatigue: Secondary | ICD-10-CM | POA: Diagnosis not present

## 2011-12-27 DIAGNOSIS — E039 Hypothyroidism, unspecified: Secondary | ICD-10-CM | POA: Diagnosis not present

## 2011-12-27 DIAGNOSIS — R5381 Other malaise: Secondary | ICD-10-CM | POA: Diagnosis not present

## 2011-12-27 DIAGNOSIS — F329 Major depressive disorder, single episode, unspecified: Secondary | ICD-10-CM

## 2011-12-27 DIAGNOSIS — F341 Dysthymic disorder: Secondary | ICD-10-CM

## 2011-12-27 MED ORDER — BUPROPION HCL 75 MG PO TABS: 75.0000 mg | ORAL_TABLET | Freq: Two times a day (BID) | ORAL | Status: DC

## 2011-12-27 MED ORDER — LEVOTHYROXINE SODIUM 75 MCG PO TABS: 75.0000 ug | ORAL_TABLET | Freq: Every day | ORAL | Status: DC

## 2011-12-27 MED ORDER — ESCITALOPRAM OXALATE 10 MG PO TABS: 10.0000 mg | ORAL_TABLET | Freq: Every day | ORAL | Status: DC

## 2011-12-27 NOTE — Assessment & Plan Note (Signed)
Presents with lack of energy, review of systems does not point to any specific cardiac problem. Clinically no symptoms of PMR. She does have depression Plan:  Labs Rx depression

## 2011-12-27 NOTE — Assessment & Plan Note (Addendum)
See previous entry, self discontinue Lexapro because it helped "very little". PHQ -9 she scored #8, with some difficulty with activities of daily living (mild depression) At this point , since she continue to complain of depression (although mild per PHQ) I'll rec Lexapro 10 mg + Wellbutrin 75 twice a day because her persistent fatigue may be related to this issue .  Reassess in one month

## 2011-12-27 NOTE — Assessment & Plan Note (Signed)
Labs , reports good medication compliance

## 2011-12-27 NOTE — Progress Notes (Signed)
  Subjective:    Patient ID: Marie Haynes, female    DOB: 1931-09-10, 78 y.o.   MRN: [id number]  HPI Acute visit, here for evaluation of fatigue Described fatigue as simply lack of energy, unable to things as quickly as she used to. Her husband  had a TKR 3 months ago, she has been helping him a lot and doing more home chores than usual. When asked, she admits to depression on and off,   Lexapro up to 1.5 tablets daily   helped "very little" so she discontinued it.  Past Medical History:   Hypothyroidism   Anxiety and depression   Osteopenia   Spinal stenosis   G5 P3 miscarriage x 2   borderline DM when she was age [age]yo   stress incontinence (saw Urology 5-09)   Osteoarthritis   Past Surgical History:   back surgery (5/08) - spinal stenosis   S/P D/C   Social History:   Married, 3 kids   tobacco-- never   ETOH-- rarely    Review of Systems No chest pain, lower extremity edema, orthopnea or dyspnea on exertion. No cough No nausea, vomiting, diarrhea. No blood in the stools. Occasional constipation. Denies fever, chills, headaches weight loss    Objective:   Physical Exam  General -- alert, well-developed, and well-nourished.   Neck --no thyromegaly  Lungs -- normal respiratory effort, no intercostal retractions, no accessory muscle use, and normal breath sounds.   Heart-- normal rate, regular rhythm, no murmur, and no gallop.   Abdomen--soft, non-tender, no distention, no masses, no HSM, no guarding, and no rigidity.   Extremities-- no pretibial edema bilaterally Neurologic-- alert & oriented X3 and strength normal in all extremities. Psych-- Cognition and judgment appear intact. Alert and cooperative with normal attention span and concentration.  not anxious appearing and not depressed appearing.      Assessment & Plan:

## 2011-12-28 LAB — BASIC METABOLIC PANEL
CO2: 29 mEq/L (ref 19–32)
Calcium: 9.3 mg/dL (ref 8.4–10.5)
GFR: 70.6 mL/min (ref >60.00)
Sodium: 139 mEq/L (ref 135–145)

## 2011-12-28 LAB — CBC WITH DIFFERENTIAL/PLATELET
Basophils Relative: 0.9 % (ref 0.0–3.0)
Eosinophils Relative: 2.3 % (ref 0.0–5.0)
Hemoglobin: 12.1 g/dL (ref 12.0–15.0)
Lymphocytes Relative: 19.3 % (ref 12.0–46.0)
Monocytes Relative: 9.9 % (ref 3.0–12.0)
Neutro Abs: 3.6 10*3/uL (ref 1.4–7.7)
RBC: 3.9 Mil/uL (ref 3.87–5.11)
WBC: 5.4 10*3/uL (ref 4.5–10.5)

## 2011-12-29 ENCOUNTER — Encounter: Payer: Self-pay | Admitting: *Deleted

## 2012-01-31 ENCOUNTER — Ambulatory Visit (INDEPENDENT_AMBULATORY_CARE_PROVIDER_SITE_OTHER): Payer: Medicare Other | Admitting: Internal Medicine

## 2012-01-31 VITALS — BP 108/72 | HR 67 | Temp 97.6°F | Wt 135.0 lb

## 2012-01-31 DIAGNOSIS — F341 Dysthymic disorder: Secondary | ICD-10-CM

## 2012-01-31 DIAGNOSIS — M899 Disorder of bone, unspecified: Secondary | ICD-10-CM | POA: Diagnosis not present

## 2012-01-31 DIAGNOSIS — R5383 Other fatigue: Secondary | ICD-10-CM

## 2012-01-31 DIAGNOSIS — R5381 Other malaise: Secondary | ICD-10-CM | POA: Diagnosis not present

## 2012-01-31 DIAGNOSIS — F329 Major depressive disorder, single episode, unspecified: Secondary | ICD-10-CM

## 2012-01-31 DIAGNOSIS — E039 Hypothyroidism, unspecified: Secondary | ICD-10-CM | POA: Diagnosis not present

## 2012-01-31 DIAGNOSIS — M8580 Other specified disorders of bone density and structure, unspecified site: Secondary | ICD-10-CM

## 2012-01-31 MED ORDER — LEVOTHYROXINE SODIUM 75 MCG PO TABS: 75.0000 ug | ORAL_TABLET | Freq: Every day | ORAL | Status: DC

## 2012-01-31 MED ORDER — ESCITALOPRAM OXALATE 10 MG PO TABS: 10.0000 mg | ORAL_TABLET | Freq: Every day | ORAL | Status: DC

## 2012-01-31 MED ORDER — BUPROPION HCL 75 MG PO TABS: 75.0000 mg | ORAL_TABLET | Freq: Two times a day (BID) | ORAL | Status: DC

## 2012-01-31 NOTE — Progress Notes (Signed)
  Subjective:    Patient ID: Marie Haynes, female    DOB: 09/14/1931, 78 y.o.   MRN: [id number]  HPI Followup. At the last office visit, she was a started on Lexapro and Wellbutrin for anxiety and depression. She also complained of fatigue. Today he reports good compliance with medication,  As far as her symptoms, she feels about the same. She filled another PHQ-9. See assessment and plan. Likes a vitamin D checked.  Past Medical History:   Hypothyroidism   Anxiety and depression   Osteopenia   Spinal stenosis   G5 P3 miscarriage x 2   borderline DM when she was age [age]yo   stress incontinence (saw Urology 5-09)   Osteoarthritis    Past Surgical History:   back surgery (5/08) - spinal stenosis   S/P D/C    Social History:   Married, 3 kids   tobacco-- never   ETOH-- rarely    Review of Systems Denies suicidal ideas No nausea or vomiting Stools have been slightly loose for the last 5-6 weeks. Denies abdominal pain or blood in the stools. Related to medication?    Objective:   Physical Exam Alert oriented x3, no apparent distress. Healthy-appearing 78 year old lady.      Assessment & Plan:

## 2012-01-31 NOTE — Patient Instructions (Signed)
Continue with his same medications for now If the diarrhea is not better in few weeks or if it gets worse let me know Come back in 3 months.

## 2012-01-31 NOTE — Assessment & Plan Note (Addendum)
Subjectively about the same, check vitamin D. Will try to check also B12 and folic acid however I found no code to associated the tests

## 2012-01-31 NOTE — Assessment & Plan Note (Signed)
Refill medications

## 2012-01-31 NOTE — Assessment & Plan Note (Addendum)
At the last visit, she was started on Lexapro and Wellbutrin. PHQ 9 went from #8 to #4 today so despite the lack of subjective improvement, we do have some objective improvement in her mood. Additionally, the patient looks back and she recognizes that she has been very anxious. She has developed some diarrhea, red flag symptoms negative, could be related to SSRIs.  Plan to observe diarrhea for a couple of weeks if she's not better will consider change to another SSRI. Refill medications, reassess in 3 months.

## 2012-02-01 ENCOUNTER — Encounter: Payer: Self-pay | Admitting: Internal Medicine

## 2012-02-02 ENCOUNTER — Encounter: Payer: Self-pay | Admitting: Internal Medicine

## 2012-04-17 DIAGNOSIS — H35369 Drusen (degenerative) of macula, unspecified eye: Secondary | ICD-10-CM | POA: Diagnosis not present

## 2012-04-17 DIAGNOSIS — H18599 Other hereditary corneal dystrophies, unspecified eye: Secondary | ICD-10-CM | POA: Diagnosis not present

## 2012-04-17 DIAGNOSIS — H02839 Dermatochalasis of unspecified eye, unspecified eyelid: Secondary | ICD-10-CM | POA: Diagnosis not present

## 2012-04-17 DIAGNOSIS — H26499 Other secondary cataract, unspecified eye: Secondary | ICD-10-CM | POA: Diagnosis not present

## 2012-04-17 DIAGNOSIS — Z961 Presence of intraocular lens: Secondary | ICD-10-CM | POA: Diagnosis not present

## 2012-04-17 DIAGNOSIS — H43819 Vitreous degeneration, unspecified eye: Secondary | ICD-10-CM | POA: Diagnosis not present

## 2012-06-21 ENCOUNTER — Ambulatory Visit (INDEPENDENT_AMBULATORY_CARE_PROVIDER_SITE_OTHER): Payer: BC Managed Care – PPO | Admitting: Family Medicine

## 2012-06-21 ENCOUNTER — Encounter: Payer: Self-pay | Admitting: Family Medicine

## 2012-06-21 ENCOUNTER — Telehealth: Payer: Self-pay | Admitting: Internal Medicine

## 2012-06-21 VITALS — BP 158/78 | HR 98 | Temp 99.8°F | Ht 63.25 in | Wt 135.0 lb

## 2012-06-21 DIAGNOSIS — J329 Chronic sinusitis, unspecified: Secondary | ICD-10-CM

## 2012-06-21 DIAGNOSIS — R509 Fever, unspecified: Secondary | ICD-10-CM | POA: Diagnosis not present

## 2012-06-21 LAB — POCT INFLUENZA A/B
Influenza A, POC: NEGATIVE
Influenza B, POC: NEGATIVE

## 2012-06-21 MED ORDER — PROMETHAZINE-DM 6.25-15 MG/5ML PO SYRP: 5.0000 mL | ORAL_SOLUTION | Freq: Four times a day (QID) | ORAL | Status: DC | PRN

## 2012-06-21 MED ORDER — AMOXICILLIN 875 MG PO TABS: 875.0000 mg | ORAL_TABLET | Freq: Two times a day (BID) | ORAL | Status: DC

## 2012-06-21 NOTE — Patient Instructions (Addendum)
This is a sinus infection Start the Amoxicillin twice daily- take w/ food Drink plenty of fluids OTC Mucinex to thin your congestion Use the cough syrup as needed- may cause drowsiness REST! Hang in there!!!

## 2012-06-21 NOTE — Progress Notes (Signed)
  Subjective:    Patient ID: Marie Haynes, female    DOB: 10-Dec-1931, 79 y.o.   MRN: [id number]  HPI URI- sxs started Sunday night, 'i thought it was a cold'.  By late Monday had body aches, nasal congestion, cough- productive.  L sided facial pain/pressure, + upper tooth pain on L.  L ear pain.  + fever, Tm 100.4.  + sick contacts.  No N/V/D.     Review of Systems For ROS see HPI     Objective:   Physical Exam  Vitals reviewed. Constitutional: She appears well-developed and well-nourished. No distress.  HENT:  Head: Normocephalic and atraumatic.  Right Ear: Tympanic membrane normal.  Left Ear: Tympanic membrane normal.  Nose: Mucosal edema and rhinorrhea present. Right sinus exhibits no maxillary sinus tenderness and no frontal sinus tenderness. Left sinus exhibits maxillary sinus tenderness and frontal sinus tenderness.  Mouth/Throat: Uvula is midline and mucous membranes are normal. Posterior oropharyngeal erythema present. No oropharyngeal exudate.  Eyes: Conjunctivae normal and EOM are normal. Pupils are equal, round, and reactive to light.  Neck: Normal range of motion. Neck supple.  Cardiovascular: Normal rate, regular rhythm and normal heart sounds.   Pulmonary/Chest: Effort normal and breath sounds normal. No respiratory distress. She has no wheezes.  Lymphadenopathy:    She has no cervical adenopathy.          Assessment & Plan:

## 2012-06-21 NOTE — Assessment & Plan Note (Signed)
New to provider.  Sxs and PE consistent w/ infxn.  Start abx.  Cough meds prn.  Reviewed supportive care and red flags that should prompt return.  Pt expressed understanding and is in agreement w/ plan.

## 2012-06-21 NOTE — Telephone Encounter (Signed)
noted 

## 2012-06-21 NOTE — Telephone Encounter (Signed)
Patient Information:  Caller Name: Vonna Kotyk  Phone: [phone]  Patient: Marie Haynes  Gender: Female  DOB: 06/17/1931  Age: 79 Years  PCP: Willow Ora  Office Follow Up:  Does the office need to follow up with this patient?: Yes  Instructions For The Office: PATIENT HAS FLU LIKE SYMPTOMS.  APPT SCHEDULED TODAY AT 13;30 WITH DR.TABORI-  HEADS UP!  RN Note:  requesting an appt for evaluation.  She has not a flu shot.  Husband has had flu shot  Symptoms  Reason For Call & Symptoms: Husband states for 2-3 days acting "like the flu".  Worse this morning, +coughing wet sounding- productive green yellow, body aches all over. +sore throat and pain with turning head. No rash. Temp 100.4  Reviewed Health History In EMR: Yes  Reviewed Medications In EMR: Yes  Reviewed Allergies In EMR: Yes  Reviewed Surgeries / Procedures: No  Date of Onset of Symptoms: 06/18/2012  Treatments Tried: advil and motrin  Treatments Tried Worked: No  Any Fever: Yes  Fever Taken: Oral  Fever Time Of Reading: 11:18:00  Fever Last Reading: 100.4  Guideline(s) Used:  Influenza - Seasonal  Disposition Per Guideline:   Go to Office Now  Reason For Disposition Reached:   Fever > 100.5 F (38.1 C) and over 82 years of age  Advice Given:  Reassurance  For most healthy adults, influenza feels like a bad cold. The dangers of influenza for normal, healthy people (under 64 years of age) are overrated.  The treatment of influenza depends on your main symptoms. Generally, treatment is the same as for other viral respiratory infections (colds). Bed rest is unnecessary.  Here is some care advice that should help.  Treating the Symptoms of Flu  Fever, Muscle Aches, and Headache: For fever more than 101 F (38.3 C), muscle aches, and headaches, take acetaminophen every 4-6 hours (Adults 650 mg) OR ibuprofen every 6-8 hours (Adults 400-600 mg).  Treating the Symptoms of Flu  Fever, Muscle Aches, and Headache: For fever  more than 101 F (38.3 C), muscle aches, and headaches, take acetaminophen every 4-6 hours (Adults 650 mg) OR ibuprofen every 6-8 hours (Adults 400-600 mg).  Sore Throat: Use throat lozenges, hard candy or warm chicken broth.  Cough: Use cough drops.  Hydrate: Drink extra liquids. If the air in your home is dry, use a humidifier.  No Aspirin  : Do not use aspirin for treatment of fever or pain (Reason: there is an association between influenza and Reye syndrome).  Isolation is Needed Until After the Fever is Gone:   The CDC recommends that people with influenza-like illness remain at home until at least 24 hours after they are free of fever (100 F or 37.8C).  Do NOT go to work or school.  Do NOT go to church, child care centers, shopping, or other public places.  Do NOT shake hands.  Avoid close contact with others (hugging, kissing).  Expected Course  : The fever lasts 2-3 days, the runny nose 5-10 days, and the cough 2-3 weeks.  Call Back If:  Fever lasts more than 3 days  Runny nose lasts more than 10 days  Cough lasts more than 3 weeks  You become short of breath or worse.  For a Runny Nose With Profuse Discharge:   Nasal mucus and discharge helps to wash viruses and bacteria out of the nose and sinuses.  Blowing the nose is all that is needed.  If the skin around your  nostrils gets irritated, apply a tiny amount of petroleum ointment to the nasal openings once or twice a day.  For a Stuffy Nose - Use Nasal Washes:  Introduction: Saline (salt water) nasal irrigation (nasal wash) is an effective and simple home remedy for treating stuffy nose and sinus congestion. The nose can be irrigated by pouring, spraying, or squirting salt water into the nose and then letting it run back out.  How it Helps: The salt water rinses out excess mucus, washes out any irritants (dust, allergens) that might be present, and moistens the nasal cavity.  Methods: There are several ways to perform nasal  irrigation. You can use a saline nasal spray bottle (available over-the-counter), a rubber ear syringe, a medical syringe without the needle, or a Neti Pot.  Coughing Spasms  Drink warm fluids. Inhale warm mist (Reason: both relax the airway and loosen up the phlegm).  Suck on cough drops or hard candy to coat the irritated throat.  Appointment Scheduled:  06/21/2012 13:30:00 Appointment Scheduled Provider:  Sheliah Hatch.

## 2012-07-02 ENCOUNTER — Ambulatory Visit (INDEPENDENT_AMBULATORY_CARE_PROVIDER_SITE_OTHER): Payer: Medicare Other | Admitting: Internal Medicine

## 2012-07-02 VITALS — BP 112/80 | HR 65 | Temp 97.5°F | Wt 135.0 lb

## 2012-07-02 DIAGNOSIS — F419 Anxiety disorder, unspecified: Secondary | ICD-10-CM

## 2012-07-02 DIAGNOSIS — Z23 Encounter for immunization: Secondary | ICD-10-CM

## 2012-07-02 DIAGNOSIS — J329 Chronic sinusitis, unspecified: Secondary | ICD-10-CM | POA: Diagnosis not present

## 2012-07-02 DIAGNOSIS — F341 Dysthymic disorder: Secondary | ICD-10-CM | POA: Diagnosis not present

## 2012-07-02 DIAGNOSIS — M899 Disorder of bone, unspecified: Secondary | ICD-10-CM

## 2012-07-02 MED ORDER — FLUTICASONE PROPIONATE 50 MCG/ACT NA SUSP: 2.0000 | Freq: Every day | NASAL | Status: DC

## 2012-07-02 MED ORDER — DOXYCYCLINE HYCLATE 100 MG PO TABS: 100.0000 mg | ORAL_TABLET | Freq: Two times a day (BID) | ORAL | Status: DC

## 2012-07-02 NOTE — Patient Instructions (Addendum)
Use Flonase every day for several weeks If the pain continue in 10 days, take doxycycline for 10 days  If the pain contin after the antibiotics , talk to your dentist, if the pain is severe let me know, you'll need a CT of the sinuses.

## 2012-07-02 NOTE — Assessment & Plan Note (Signed)
Self discontinued Fosamax because she learned her mandibular bones were weak

## 2012-07-02 NOTE — Assessment & Plan Note (Signed)
Self discontinued Lexapro Wellbutrin, currently feeling well.

## 2012-07-02 NOTE — Progress Notes (Signed)
  Subjective:    Patient ID: Marie Haynes, female    DOB: 02/22/1932, 79 y.o.   MRN: [id number]  HPI Acute visit Was seen recently by one of my partners, she was having aches, cough, nose congestion and facial pain. Was prescribed amoxicillin which she took. At this point, she feels better but is concerned because she still has mild left facial pain. The pain is not worse when she chew her food and also when she blows her nose. She has a dental problem and needs a root canal on one of the left upper teeth.  Past Medical History:   Hypothyroidism   Anxiety and depression   Osteopenia   Spinal stenosis   G5 P3 miscarriage x 2   borderline DM when she was age [age]yo   stress incontinence (saw Urology 5-09)   Osteoarthritis    Past Surgical History:   back surgery (5/08) - spinal stenosis   S/P D/C    Social History:   Married, 3 kids   tobacco-- never   ETOH-- rarely    Review of Systems No fever or chills No cough, mild chest congestion. As far as her depression, she self discontinued medication and is feeling well. She also discontinued Fosamax, see assessment and plan    Objective:   Physical Exam  HENT:  Head:      General -- alert, well-developed  HEENT -- TMs normal, throat w/o redness, face symmetric and  slightly tender to palpation at the left side. EOMI Oral cavity: Slightly tender to percussion at  one of the left upper teeth, gum without swelling Lungs -- normal respiratory effort, no intercostal retractions, no accessory muscle use, mild large airway congestion that clear with cough  Heart-- normal rate, regular rhythm, no murmur, and no gallop.   Neurologic-- alert & oriented X3 and strength normal in all extremities. Psych-- Cognition and judgment appear intact. Alert and cooperative with normal attention span and concentration.  not anxious appearing and not depressed appearing.        Assessment & Plan:

## 2012-07-02 NOTE — Assessment & Plan Note (Addendum)
Recently treated for sinusitis with amoxicillin, symptoms are better but not completely gone. She is in need of a root canal, facial pain may be related to her denture. Plan: Flonase daily, if symptoms not improved some, she will take doxycycline . If symptoms continue she needs to address the pain with her dentist. If symptoms severe, may need a CT of the sinuses. Pt  Aware. Addendum, request a flu shot, she's not running fevers at the present time, we'll go ahead and give her a flu shot

## 2012-07-03 ENCOUNTER — Encounter: Payer: Self-pay | Admitting: Internal Medicine

## 2012-09-03 ENCOUNTER — Encounter: Payer: Self-pay | Admitting: Internal Medicine

## 2012-09-03 ENCOUNTER — Ambulatory Visit (INDEPENDENT_AMBULATORY_CARE_PROVIDER_SITE_OTHER): Payer: Medicare Other | Admitting: Internal Medicine

## 2012-09-03 VITALS — BP 130/78 | HR 63 | Temp 97.5°F | Ht 62.5 in | Wt 132.0 lb

## 2012-09-03 DIAGNOSIS — F341 Dysthymic disorder: Secondary | ICD-10-CM | POA: Diagnosis not present

## 2012-09-03 DIAGNOSIS — I714 Abdominal aortic aneurysm, without rupture: Secondary | ICD-10-CM

## 2012-09-03 DIAGNOSIS — M949 Disorder of cartilage, unspecified: Secondary | ICD-10-CM

## 2012-09-03 DIAGNOSIS — E039 Hypothyroidism, unspecified: Secondary | ICD-10-CM

## 2012-09-03 DIAGNOSIS — Z0000 Encounter for general adult medical examination without abnormal findings: Secondary | ICD-10-CM | POA: Diagnosis not present

## 2012-09-03 DIAGNOSIS — F419 Anxiety disorder, unspecified: Secondary | ICD-10-CM

## 2012-09-03 DIAGNOSIS — E785 Hyperlipidemia, unspecified: Secondary | ICD-10-CM | POA: Diagnosis not present

## 2012-09-03 DIAGNOSIS — Z1231 Encounter for screening mammogram for malignant neoplasm of breast: Secondary | ICD-10-CM

## 2012-09-03 DIAGNOSIS — R0989 Other specified symptoms and signs involving the circulatory and respiratory systems: Secondary | ICD-10-CM

## 2012-09-03 LAB — LIPID PANEL
HDL: 107.8 mg/dL (ref >39.00)
Total CHOL/HDL Ratio: 2

## 2012-09-03 LAB — COMPREHENSIVE METABOLIC PANEL
ALT: 23 U/L (ref 0–35)
AST: 23 U/L (ref 0–37)
Albumin: 3.9 g/dL (ref 3.5–5.2)
Alkaline Phosphatase: 39 U/L (ref 39–117)
Calcium: 9 mg/dL (ref 8.4–10.5)
Chloride: 103 mEq/L (ref 96–112)
Potassium: 4 mEq/L (ref 3.5–5.1)

## 2012-09-03 LAB — TSH: TSH: 6.43 u[IU]/mL — ABNORMAL HIGH (ref 0.35–5.50)

## 2012-09-03 MED ORDER — ZOSTER VACCINE LIVE 19400 UNT/0.65ML ~~LOC~~ SOLR: 0.6500 mL | Freq: Once | SUBCUTANEOUS | Status: DC

## 2012-09-03 NOTE — Assessment & Plan Note (Addendum)
Wonders if she needs any brand of medication, I'm not opposed. Thinks she may have symptoms of hypothyroidism, "my temperature is always low" Plan:  Check a TSH, I discussed that we could increase her thyroid dose and get a TSH below 1.0. WILL WAIT FOR RESULTS

## 2012-09-03 NOTE — Progress Notes (Signed)
  Subjective:    Patient ID: Marie Haynes, female    DOB: 01/10/1932, 79 y.o.   MRN: [id number]  HPI Here for Medicare AWV: 1. Risk factors based on Past M, S, F history: reviewed 2. Physical Activities: active at home, [facility]  3. Depression/mood:  (-) screening 4. Hearing:  No problemss noted or reported  5. ADL's:  independent 6. Fall Risk: no recent falls precautions discussed  7. home Safety: does feel safe at home  8. Height, weight, &visual acuity: see VS, s/p cataract surgery , doing well 9. Counseling: provided 10. Labs ordered based on risk factors: if needed  11. Referral Coordination: if needed 12.  Care Plan, see assessment and plan  13.   Cognitive Assessment: cognition very good and motor skills appropriate   In addition, today we discussed the following: Anxiety, self discontinue SSRIs, see below. Hypothyroidism, good compliance with medication, wonders if she needs the brand medication. Osteopenia, to have a DEXA elsewhere, good compliance with calcium and vitamin D.   Past Medical History:  Hypothyroidism  Anxiety and depression  Osteopenia  Spinal stenosis  G5 P3 miscarriage x 2  borderline DM when she was age [age]yo  stress incontinence (saw Urology 5-09)  Osteoarthritis   Past Surgical History  Procedure Laterality Date  . Back surgery  [id number]  . Dilation and curettage of uterus        Social History:  Married, 3 kids  tobacco-- never  ETOH-- rarely  Diet: veru healthy  Family History  Problem Relation Age of Onset  . Heart failure Mother   . Heart attack Mother   . Colon cancer Neg Hx   . Breast cancer Neg Hx     Review of Systems No chest pain or shortness or breath No nausea, vomiting, diarrhea. No blood in the stools. No vaginal discharge or vaginal bleeding.     Objective:   Physical Exam General -- alert, well-developed .   Neck --no thyromegaly  Lungs -- normal respiratory effort, no intercostal retractions, no  accessory muscle use, and normal breath sounds.   Heart-- normal rate, regular rhythm, no murmur, and no gallop.   Abdomen--not distended, soft, good bowel sounds. Nontender palpable aorta in the epigastric area, question of bruit   Extremities-- no pretibial edema bilaterally  Neurologic-- alert & oriented X3 and strength normal in all extremities. Psych-- Cognition and judgment appear intact. Alert and cooperative with normal attention span and concentration.  not anxious appearing and not depressed appearing.       Assessment & Plan:

## 2012-09-03 NOTE — Patient Instructions (Signed)

## 2012-09-03 NOTE — Assessment & Plan Note (Signed)
Mild hyperlipidemia few  years ago, recheck labs

## 2012-09-03 NOTE — Assessment & Plan Note (Addendum)
Self discontinue Lexapro, did not feel any difference while taking it .

## 2012-09-03 NOTE — Assessment & Plan Note (Signed)
Good compliance with calcium and vitamin D, last vitamin D level normal. To have DEXA elsewhere

## 2012-09-04 ENCOUNTER — Encounter: Payer: Self-pay | Admitting: Internal Medicine

## 2012-09-04 DIAGNOSIS — Z0000 Encounter for general adult medical examination without abnormal findings: Secondary | ICD-10-CM | POA: Insufficient documentation

## 2012-09-04 LAB — LDL CHOLESTEROL, DIRECT: Direct LDL: 140.1 mg/dL

## 2012-09-04 NOTE — Assessment & Plan Note (Signed)
Td 2012 pneumonia shot 1998 and 2011 zostavax-- Rx, benefits discussed  Last Colonoscopy:  06/05/1998--actual report not available  Colon bx -- hyperrplastic polyp   Dr Loreta Ave [phone]  Female care: Saw  gyn (Dr Stefano Gaul) recently  Needs a MMG ---->  will schedule   ? AAA--- will schedule a   Ultrasound Diet , exercise discussed

## 2012-09-06 NOTE — Addendum Note (Signed)
Addended by: Edwena Felty T on: 09/06/2012 10:57 AM   Modules accepted: Orders

## 2012-09-12 ENCOUNTER — Encounter: Payer: Self-pay | Admitting: *Deleted

## 2012-09-12 ENCOUNTER — Other Ambulatory Visit: Payer: Self-pay | Admitting: Neurosurgery

## 2012-09-12 DIAGNOSIS — M47816 Spondylosis without myelopathy or radiculopathy, lumbar region: Secondary | ICD-10-CM

## 2012-09-12 MED ORDER — LEVOTHYROXINE SODIUM 112 MCG PO TABS: 112.0000 ug | ORAL_TABLET | Freq: Every day | ORAL | Status: DC

## 2012-09-12 NOTE — Addendum Note (Signed)
Addended by: Edwena Felty T on: 09/12/2012 04:10 PM   Modules accepted: Orders

## 2012-09-16 ENCOUNTER — Other Ambulatory Visit: Payer: Self-pay | Admitting: Neurosurgery

## 2012-09-16 ENCOUNTER — Ambulatory Visit
Admission: RE | Admit: 2012-09-16 | Discharge: 2012-09-16 | Disposition: A | Discharge status: Home / Self Care | Payer: Medicare Other | Source: Ambulatory Visit | Attending: Neurosurgery | Admitting: Neurosurgery

## 2012-09-16 DIAGNOSIS — M47816 Spondylosis without myelopathy or radiculopathy, lumbar region: Secondary | ICD-10-CM

## 2012-09-16 DIAGNOSIS — M47814 Spondylosis without myelopathy or radiculopathy, thoracic region: Secondary | ICD-10-CM | POA: Diagnosis not present

## 2012-09-16 MED ORDER — IOHEXOL 300 MG/ML  SOLN
1.0000 mL | Freq: Once | INTRAMUSCULAR | Status: AC | PRN
  Administered 2012-09-16: 1 mL via EPIDURAL

## 2012-09-16 MED ORDER — TRIAMCINOLONE ACETONIDE 40 MG/ML IJ SUSP (RADIOLOGY)
60.0000 mg | Freq: Once | INTRAMUSCULAR | Status: AC
  Administered 2012-09-16: 60 mg via EPIDURAL

## 2012-09-17 ENCOUNTER — Encounter (INDEPENDENT_AMBULATORY_CARE_PROVIDER_SITE_OTHER): Payer: Medicare Other

## 2012-09-17 DIAGNOSIS — R0989 Other specified symptoms and signs involving the circulatory and respiratory systems: Secondary | ICD-10-CM

## 2012-09-17 DIAGNOSIS — I714 Abdominal aortic aneurysm, without rupture: Secondary | ICD-10-CM

## 2012-09-23 ENCOUNTER — Encounter: Payer: Self-pay | Admitting: *Deleted

## 2012-10-07 ENCOUNTER — Ambulatory Visit: Payer: Medicare Other

## 2012-10-08 ENCOUNTER — Ambulatory Visit: Payer: Medicare Other

## 2012-11-04 DIAGNOSIS — M47817 Spondylosis without myelopathy or radiculopathy, lumbosacral region: Secondary | ICD-10-CM | POA: Diagnosis not present

## 2012-11-04 DIAGNOSIS — M47812 Spondylosis without myelopathy or radiculopathy, cervical region: Secondary | ICD-10-CM | POA: Diagnosis not present

## 2012-11-07 DIAGNOSIS — L98499 Non-pressure chronic ulcer of skin of other sites with unspecified severity: Secondary | ICD-10-CM | POA: Diagnosis not present

## 2012-11-07 DIAGNOSIS — L819 Disorder of pigmentation, unspecified: Secondary | ICD-10-CM | POA: Diagnosis not present

## 2012-11-07 DIAGNOSIS — K1370 Unspecified lesions of oral mucosa: Secondary | ICD-10-CM | POA: Diagnosis not present

## 2012-11-13 ENCOUNTER — Other Ambulatory Visit (HOSPITAL_COMMUNITY): Payer: Self-pay | Admitting: Orthopedic Surgery

## 2012-11-13 DIAGNOSIS — M810 Age-related osteoporosis without current pathological fracture: Secondary | ICD-10-CM

## 2012-11-18 ENCOUNTER — Other Ambulatory Visit (HOSPITAL_COMMUNITY): Payer: Self-pay | Admitting: Orthopedic Surgery

## 2012-11-18 DIAGNOSIS — M810 Age-related osteoporosis without current pathological fracture: Secondary | ICD-10-CM

## 2012-11-19 ENCOUNTER — Ambulatory Visit (HOSPITAL_COMMUNITY)
Admission: RE | Admit: 2012-11-19 | Discharge: 2012-11-19 | Disposition: A | Discharge status: Home / Self Care | Payer: Medicare Other | Source: Ambulatory Visit | Attending: Orthopedic Surgery | Admitting: Orthopedic Surgery

## 2012-11-19 ENCOUNTER — Ambulatory Visit (HOSPITAL_COMMUNITY)
Admission: RE | Admit: 2012-11-19 | Discharge: 2012-11-19 | Disposition: A | Discharge status: Home / Self Care | Payer: Medicare Other | Source: Ambulatory Visit | Attending: Internal Medicine | Admitting: Internal Medicine

## 2012-11-19 DIAGNOSIS — Z13820 Encounter for screening for osteoporosis: Secondary | ICD-10-CM | POA: Insufficient documentation

## 2012-11-19 DIAGNOSIS — M810 Age-related osteoporosis without current pathological fracture: Secondary | ICD-10-CM

## 2012-11-19 DIAGNOSIS — Z1231 Encounter for screening mammogram for malignant neoplasm of breast: Secondary | ICD-10-CM | POA: Diagnosis not present

## 2012-11-19 DIAGNOSIS — Z780 Asymptomatic menopausal state: Secondary | ICD-10-CM | POA: Diagnosis not present

## 2012-12-17 DIAGNOSIS — M25519 Pain in unspecified shoulder: Secondary | ICD-10-CM | POA: Diagnosis not present

## 2012-12-26 ENCOUNTER — Other Ambulatory Visit: Payer: Self-pay | Admitting: Internal Medicine

## 2012-12-26 DIAGNOSIS — E039 Hypothyroidism, unspecified: Secondary | ICD-10-CM

## 2012-12-26 NOTE — Telephone Encounter (Signed)
Arrange TSH, refill one month only

## 2012-12-26 NOTE — Telephone Encounter (Signed)
Left message on voice mail for pt to call back and schedule lab appt for TSH & sent refill for synthroid x1 month.

## 2012-12-26 NOTE — Telephone Encounter (Signed)
Ok to refill? Pt. Overdue for TSH check as of May 2014. Will gladly re-arrange labs. Last OV 4.1.14 Last filled 4.10.14 #90 no refills.

## 2013-01-23 ENCOUNTER — Other Ambulatory Visit (INDEPENDENT_AMBULATORY_CARE_PROVIDER_SITE_OTHER): Payer: Medicare Other

## 2013-01-23 DIAGNOSIS — E039 Hypothyroidism, unspecified: Secondary | ICD-10-CM | POA: Diagnosis not present

## 2013-01-24 LAB — TSH: TSH: 1.08 u[IU]/mL (ref 0.35–5.50)

## 2013-01-28 ENCOUNTER — Telehealth: Payer: Self-pay | Admitting: *Deleted

## 2013-01-29 NOTE — Telephone Encounter (Signed)
error 

## 2013-01-31 ENCOUNTER — Encounter: Payer: Self-pay | Admitting: *Deleted

## 2013-01-31 ENCOUNTER — Telehealth: Payer: Self-pay | Admitting: *Deleted

## 2013-01-31 NOTE — Telephone Encounter (Signed)
Letter sent. DJR  

## 2013-01-31 NOTE — Telephone Encounter (Signed)
Message copied by Eustace Quail on Fri Jan 31, 2013  5:05 PM ------      Message from: Wanda Plump      Created: Sun Jan 26, 2013  3:49 PM       Please call patient:      Her thyroid is now well balanced.      I believe she is taking Synthroid 112 mcg daily, please confirm the dose, okay to refill Synthroid if needed x 6 months      She needs "brand only medically necessary".      Also remind patient she is due for a checkup by October ------

## 2013-02-07 ENCOUNTER — Telehealth: Payer: Self-pay | Admitting: General Practice

## 2013-02-07 ENCOUNTER — Other Ambulatory Visit: Payer: Self-pay | Admitting: General Practice

## 2013-02-07 DIAGNOSIS — E039 Hypothyroidism, unspecified: Secondary | ICD-10-CM

## 2013-02-07 MED ORDER — SYNTHROID 112 MCG PO TABS: 112.0000 ug | ORAL_TABLET | Freq: Every day | ORAL | Status: DC

## 2013-02-07 NOTE — Telephone Encounter (Signed)
Message on triage line: pt had called to discuss her TSH levels and receive a refill on her levothyroxine. Med filled. Pt also needs a follow up with Drue Novel by October.

## 2013-07-22 ENCOUNTER — Ambulatory Visit: Payer: Medicare Other | Admitting: Internal Medicine

## 2013-10-01 ENCOUNTER — Telehealth: Payer: Self-pay

## 2013-10-01 NOTE — Telephone Encounter (Signed)
Left message for call back Non identifiable  Pap--2006? CCS--2000 per pt neg Hemocults-2009 (-) MMG--neg bi rads --11/2012 BD--11/2012--osteopenia Tdap--2012 PNA--08/2009

## 2013-10-02 ENCOUNTER — Encounter: Payer: Self-pay | Admitting: Internal Medicine

## 2013-10-02 ENCOUNTER — Ambulatory Visit (INDEPENDENT_AMBULATORY_CARE_PROVIDER_SITE_OTHER): Payer: Medicare Other | Admitting: Internal Medicine

## 2013-10-02 VITALS — BP 147/69 | HR 69 | Temp 98.7°F | Ht 63.0 in | Wt 129.0 lb

## 2013-10-02 DIAGNOSIS — Z23 Encounter for immunization: Secondary | ICD-10-CM

## 2013-10-02 DIAGNOSIS — Z136 Encounter for screening for cardiovascular disorders: Secondary | ICD-10-CM

## 2013-10-02 DIAGNOSIS — R0989 Other specified symptoms and signs involving the circulatory and respiratory systems: Secondary | ICD-10-CM

## 2013-10-02 DIAGNOSIS — M810 Age-related osteoporosis without current pathological fracture: Secondary | ICD-10-CM

## 2013-10-02 DIAGNOSIS — Z0000 Encounter for general adult medical examination without abnormal findings: Secondary | ICD-10-CM

## 2013-10-02 DIAGNOSIS — E039 Hypothyroidism, unspecified: Secondary | ICD-10-CM

## 2013-10-02 DIAGNOSIS — E785 Hyperlipidemia, unspecified: Secondary | ICD-10-CM

## 2013-10-02 DIAGNOSIS — I7789 Other specified disorders of arteries and arterioles: Secondary | ICD-10-CM

## 2013-10-02 LAB — LIPID PANEL
CHOL/HDL RATIO: 2
Cholesterol: 248 mg/dL — ABNORMAL HIGH (ref 0–200)
HDL: 112 mg/dL (ref >39.00)
LDL CALC: 124 mg/dL — AB (ref 0–99)
TRIGLYCERIDES: 62 mg/dL (ref 0.0–149.0)
VLDL: 12.4 mg/dL (ref 0.0–40.0)

## 2013-10-02 LAB — BASIC METABOLIC PANEL
BUN: 17 mg/dL (ref 6–23)
CHLORIDE: 102 meq/L (ref 96–112)
CO2: 31 mEq/L (ref 19–32)
Calcium: 9.5 mg/dL (ref 8.4–10.5)
Creatinine, Ser: 0.7 mg/dL (ref 0.4–1.2)
GFR: 80.23 mL/min (ref >60.00)
GLUCOSE: 84 mg/dL (ref 70–99)
POTASSIUM: 4.2 meq/L (ref 3.5–5.1)
SODIUM: 140 meq/L (ref 135–145)

## 2013-10-02 LAB — CBC WITH DIFFERENTIAL/PLATELET
Basophils Absolute: 0 10*3/uL (ref 0.0–0.1)
Basophils Relative: 0.7 % (ref 0.0–3.0)
EOS ABS: 0.1 10*3/uL (ref 0.0–0.7)
EOS PCT: 3 % (ref 0.0–5.0)
HCT: 38.4 % (ref 36.0–46.0)
Hemoglobin: 12.8 g/dL (ref 12.0–15.0)
Lymphocytes Relative: 22 % (ref 12.0–46.0)
Lymphs Abs: 1 10*3/uL (ref 0.7–4.0)
MCHC: 33.2 g/dL (ref 30.0–36.0)
MCV: 93.1 fl (ref 78.0–100.0)
MONO ABS: 0.5 10*3/uL (ref 0.1–1.0)
Monocytes Relative: 11.8 % (ref 3.0–12.0)
NEUTROS PCT: 62.5 % (ref 43.0–77.0)
Neutro Abs: 2.7 10*3/uL (ref 1.4–7.7)
PLATELETS: 221 10*3/uL (ref 150.0–400.0)
RBC: 4.12 Mil/uL (ref 3.87–5.11)
RDW: 14.1 % (ref 11.5–14.6)

## 2013-10-02 LAB — TSH: TSH: 2.17 u[IU]/mL (ref 0.35–5.50)

## 2013-10-02 MED ORDER — ZOSTER VACCINE LIVE 19400 UNT/0.65ML ~~LOC~~ SOLR: 0.6500 mL | Freq: Once | SUBCUTANEOUS | Status: DC

## 2013-10-02 NOTE — Patient Instructions (Signed)
Get your blood work before you leave     Next visit is for routine check up regards your osteoporosis and thyroid  in 6 months  No need to come back fasting Please make an appointment     Fall Prevention and [place] cause injuries and can affect all age groups. It is possible to use preventive measures to significantly decrease the likelihood of falls. There are many simple measures which can make your home safer and prevent falls. OUTDOORS  Repair cracks and edges of walkways and driveways.  Remove high doorway thresholds.  Trim shrubbery on the main path into your home.  Have good outside lighting.  Clear walkways of tools, rocks, debris, and clutter.  Check that handrails are not broken and are securely fastened. Both sides of steps should have handrails.  Have leaves, snow, and ice cleared regularly.  Use sand or salt on walkways during winter months.  In the garage, clean up grease or oil spills. BATHROOM  Install night lights.  Install grab bars by the toilet and in the tub and shower.  Use non-skid mats or decals in the tub or shower.  Place a plastic non-slip stool in the shower to sit on, if needed.  Keep floors dry and clean up all water on the floor immediately.  Remove soap buildup in the tub or shower on a regular basis.  Secure bath mats with non-slip, double-sided rug tape.  Remove throw rugs and tripping hazards from the floors. BEDROOMS  Install night lights.  Make sure a bedside light is easy to reach.  Do not use oversized bedding.  Keep a telephone by your bedside.  Have a firm chair with side arms to use for getting dressed.  Remove throw rugs and tripping hazards from the floor. KITCHEN  Keep handles on pots and pans turned toward the center of the stove. Use back burners when possible.  Clean up spills quickly and allow time for drying.  Avoid walking on wet floors.  Avoid hot utensils and knives.  Position shelves  so they are not too high or low.  Place commonly used objects within easy reach.  If necessary, use a sturdy step stool with a grab bar when reaching.  Keep electrical cables out of the way.  Do not use floor polish or wax that makes floors slippery. If you must use wax, use non-skid floor wax.  Remove throw rugs and tripping hazards from the floor. STAIRWAYS  Never leave objects on stairs.  Place handrails on both sides of stairways and use them. Fix any loose handrails. Make sure handrails on both sides of the stairways are as long as the stairs.  Check carpeting to make sure it is firmly attached along stairs. Make repairs to worn or loose carpet promptly.  Avoid placing throw rugs at the top or bottom of stairways, or properly secure the rug with carpet tape to prevent slippage. Get rid of throw rugs, if possible.  Have an electrician put in a light switch at the top and bottom of the stairs. OTHER FALL PREVENTION TIPS  Wear low-heel or rubber-soled shoes that are supportive and fit well. Wear closed toe shoes.  When using a stepladder, make sure it is fully opened and both spreaders are firmly locked. Do not climb a closed stepladder.  Add color or contrast paint or tape to grab bars and handrails in your home. Place contrasting color strips on first and last steps.  Learn and use mobility  aids as needed. Install an electrical emergency response system.  Turn on lights to avoid dark areas. Replace light bulbs that burn out immediately. Get light switches that glow.  Arrange furniture to create clear pathways. Keep furniture in the same place.  Firmly attach carpet with non-skid or double-sided tape.  Eliminate uneven floor surfaces.  Select a carpet pattern that does not visually hide the edge of steps.  Be aware of all pets. OTHER HOME SAFETY TIPS  Set the water temperature for 120 F (48.8 C).  Keep emergency numbers on or near the telephone.  Keep smoke  detectors on every level of the home and near sleeping areas. Document Released: 05/12/2002 Document Revised: 11/21/2011 Document Reviewed: 08/11/2011 [facility] Patient Information 2014 [place].

## 2013-10-02 NOTE — Assessment & Plan Note (Signed)
Labs

## 2013-10-02 NOTE — Assessment & Plan Note (Addendum)
DEXA 09-2012 Tscore -2.7 Few years ago, Dr. Hal Morales orthopedic surgery prescribed Fosamax, took it for 2 years, discontinued it due to the dental infection and jaw bone loss, does not like to go back on those type of medicines. We discussed other options including prolia and likes to proceed, will arrange

## 2013-10-02 NOTE — Progress Notes (Signed)
Pre visit review using our clinic review tool, if applicable. No additional management support is needed unless otherwise documented below in the visit note. 

## 2013-10-02 NOTE — Progress Notes (Signed)
Subjective:    Patient ID: Marie Haynes, female    DOB: Jun 05, 1932, 80 y.o.   MRN: [id number]  DOS:  10/02/2013 Type of  visit: Here for Medicare AWV: 1. Risk factors based on Past M, S, F history: reviewed 2. Physical Activities: active at home, rarely goes to the gym  3. Depression/mood:  (-) screening 4. Hearing:  No problemss noted or reported   5. ADL's:  independent, drives  6. Fall Risk: no recent falls precautions discussed   7. home Safety: does feel safe at home   8. Height, weight, &visual acuity: see VS, s/p cataract surgery , doing well 9. Counseling: provided 10. Labs ordered based on risk factors: if needed   11. Referral Coordination: if needed 12.  Care Plan, see assessment and plan   13.   Cognitive Assessment: cognition very good and motor skills appropriate for age   In addition, today we discussed the following: OA-- sx decrease after gluten free diet Thyroid -- good med compliance  Osteoporosis-- dexa discussed, see a/p     ROS No  CP, SOB No palpitations  Denies  nausea, vomiting diarrhea No abdominal pain Denies  blood in the stools No GERD  Sx. (-) cough, sputum production (-) wheezing, chest congestion No dysuria, gross hematuria, difficulty urinating    No vaginal discharge,spotting    Past Medical History  Diagnosis Date  . Osteoporosis   . Anxiety and depression   . Spinal stenosis   . DJD (degenerative joint disease)   . Stress incontinence     (saw Urology 5-09)    . Hypothyroidism     Past Surgical History  Procedure Laterality Date  . Back surgery  [id number]  . Dilation and curettage of uterus      History   Social History  . Marital Status: Married    Spouse Name: N/A    Number of Children: 3  . Years of Education: N/A   Occupational History  . retired Psychologist, prison and probation services     Social History Main Topics  . Smoking status: Never Smoker   . Smokeless tobacco: Never Used  . Alcohol Use: Yes     Comment: Rare  . Drug  Use: No  . Sexual Activity: Not on file   Other Topics Concern  . Not on file   Social History Narrative  . No narrative on file     Family History  Problem Relation Age of Onset  . Heart failure Mother   . Heart attack Mother   . Colon cancer Neg Hx   . Breast cancer Neg Hx   . Diabetes Neg Hx        Medication List       This list is accurate as of: 10/02/13  1:11 PM.  Always use your most recent med list.               CALCIUM + D PO  Take 3 tablets by mouth 2 (two) times daily.     cholecalciferol 400 UNITS Tabs tablet  Commonly known as:  VITAMIN D  Take 400 Units by mouth daily.     fish oil-omega-3 fatty acids 1000 MG capsule  Take 1 g by mouth 2 (two) times daily.     GLUCOSAMINE 1500 COMPLEX PO  Take 2 tablets by mouth 2 (two) times daily.     SYNTHROID 112 MCG tablet  Generic drug:  levothyroxine  Take 1 tablet (112 mcg total) by mouth daily before  breakfast. No additional refills until after labs completed.     vitamin C 500 MG tablet  Commonly known as:  ASCORBIC ACID  Take 500 mg by mouth 2 (two) times daily.     vitamin E 400 UNIT capsule  Take 400 Units by mouth 3 (three) times daily.     vitamin k 100 MCG tablet  Take 100 mcg by mouth daily.     zoster vaccine live (PF) 19400 UNT/0.65ML injection  Commonly known as:  ZOSTAVAX  Inject 19,400 Units into the skin once.           Objective:   Physical Exam BP 147/69  Pulse 69  Temp(Src) 98.7 F (37.1 C)  Ht 5\' 3"  (1.6 m)  Wt 129 lb (58.514 kg)  BMI 22.86 kg/m2  SpO2 100% General -- alert, well-developed, NAD.  Neck --no thyromegaly , normal carotid pulse  HEENT-- Not pale.  Lungs -- normal respiratory effort, no intercostal retractions, no accessory muscle use, and normal breath sounds.  Heart-- normal rate, regular rhythm, no murmur.  Abdomen-- Not distended, good bowel sounds,soft, non-tender. + palpable Ao upper abdomen, no tender, no bruit Extremities-- no pretibial  edema bilaterally  Neurologic--  alert & oriented X3. Speech normal, gait normal, strength normal in all extremities.  Psych-- Cognition and judgment appear intact. Cooperative with normal attention span and concentration. No anxious or depressed appearing.          Assessment & Plan:

## 2013-10-02 NOTE — Assessment & Plan Note (Addendum)
Td 2012 pneumonia shot 1998 and 2011 prevnar today zostavax-- Rx, benefits discussed again , Rx reissued   Last Colonoscopy:  06/05/1998--actual report not available  Colon bx -- hyperrplastic polyp   Dr Collene Mares [phone]  Female care: Per  gyn (Dr Raphael Gibney)  , plans to see him again [facility]--neg bi rads --11/2012 , rec to repeat this year (life expectancy likely 7-10 years)  ? AAA--- will schedule a   Ultrasound-- correction 09-2012 [place] neg for AAA Diet , exercise discussed

## 2013-10-02 NOTE — Telephone Encounter (Signed)
Unable to reach prior to visit  Enacted Prior Authorization paperwork for Prolia.  Faxed

## 2013-10-06 ENCOUNTER — Encounter: Payer: Self-pay | Admitting: *Deleted

## 2013-10-06 ENCOUNTER — Telehealth: Payer: Self-pay

## 2013-10-06 NOTE — Telephone Encounter (Signed)
Second provider request Paperwork completed and sent 10/02/2013

## 2013-10-23 ENCOUNTER — Telehealth: Payer: Self-pay | Admitting: Internal Medicine

## 2013-10-23 DIAGNOSIS — E039 Hypothyroidism, unspecified: Secondary | ICD-10-CM

## 2013-10-23 NOTE — Telephone Encounter (Signed)
Pt came in today and wants to switch to generic Synthroid via mail order due to cost.  Pt wants to know about getting a shot 2x/year to prevent osteoporosis.  Pt was also following up from visit with Dr. Larose Kells  In April regarding a referral for an ultrasound.  Please advise.

## 2013-10-24 MED ORDER — LEVOTHYROXINE SODIUM 112 MCG PO TABS: 112.0000 ug | ORAL_TABLET | Freq: Every day | ORAL | Status: DC

## 2013-10-24 NOTE — Telephone Encounter (Signed)
I sent the generic synthroid in.  Please advise on the prolia and the ultrasound.

## 2013-10-26 NOTE — Telephone Encounter (Signed)
1.Okay to change to generic Synthroid 2. Please arrange for PROLIA injections q 6 months, dx osteoporosis

## 2013-10-29 ENCOUNTER — Telehealth: Payer: Self-pay | Admitting: *Deleted

## 2013-10-29 NOTE — Telephone Encounter (Signed)
Pt states never received a call from U/S. Does pt need U/S order please advise last OV- 10/02/13

## 2013-10-29 NOTE — Telephone Encounter (Signed)
Left detailed message on voice mail that pt did not need another U/S. Advised to call the office with questions,

## 2013-10-29 NOTE — Telephone Encounter (Signed)
She had a aorta ultrasound last year. I'm not aware that she needs another ultrasound.

## 2013-10-30 NOTE — Telephone Encounter (Signed)
Called pt to set up Prolia injection, left message on voice mail to return my call. Patient will have approximately $54.00 out of pocket expense according summary of benefits from her insurance company. Okay to schedule if pt calls back, insurance has approved this.Marland Kitchen

## 2013-11-18 ENCOUNTER — Telehealth: Payer: Self-pay | Admitting: Internal Medicine

## 2013-11-18 NOTE — Telephone Encounter (Signed)
Spoke with patient after she returned my call and advised that she did not need another U/S at this time.

## 2013-12-03 ENCOUNTER — Telehealth: Payer: Self-pay | Admitting: Internal Medicine

## 2013-12-03 DIAGNOSIS — E039 Hypothyroidism, unspecified: Secondary | ICD-10-CM

## 2013-12-03 MED ORDER — LEVOTHYROXINE SODIUM 112 MCG PO TABS: 112.0000 ug | ORAL_TABLET | Freq: Every day | ORAL | Status: DC

## 2013-12-03 NOTE — Telephone Encounter (Signed)
Resent rx

## 2013-12-03 NOTE — Telephone Encounter (Signed)
Caller name: Danecia  Call back number:[phone] Pharmacy: [facility]  Reason for call:  Pt still has not got their RX levothyroxine (SYNTHROID, LEVOTHROID) 112 MCG tablet from the mail order pharmacy; they state that they never received it.  Can we resend the rx.

## 2013-12-04 ENCOUNTER — Ambulatory Visit
Admission: RE | Admit: 2013-12-04 | Discharge: 2013-12-04 | Disposition: A | Discharge status: Home / Self Care | Payer: Medicare Other | Source: Ambulatory Visit | Attending: Neurosurgery | Admitting: Neurosurgery

## 2013-12-04 ENCOUNTER — Other Ambulatory Visit: Payer: Self-pay | Admitting: Neurosurgery

## 2013-12-04 DIAGNOSIS — M47816 Spondylosis without myelopathy or radiculopathy, lumbar region: Secondary | ICD-10-CM

## 2013-12-04 DIAGNOSIS — M5137 Other intervertebral disc degeneration, lumbosacral region: Secondary | ICD-10-CM | POA: Diagnosis not present

## 2013-12-04 DIAGNOSIS — M47817 Spondylosis without myelopathy or radiculopathy, lumbosacral region: Secondary | ICD-10-CM | POA: Diagnosis not present

## 2013-12-08 DIAGNOSIS — M47814 Spondylosis without myelopathy or radiculopathy, thoracic region: Secondary | ICD-10-CM | POA: Diagnosis not present

## 2013-12-08 DIAGNOSIS — M47812 Spondylosis without myelopathy or radiculopathy, cervical region: Secondary | ICD-10-CM | POA: Diagnosis not present

## 2013-12-08 DIAGNOSIS — M47817 Spondylosis without myelopathy or radiculopathy, lumbosacral region: Secondary | ICD-10-CM | POA: Diagnosis not present

## 2013-12-10 ENCOUNTER — Other Ambulatory Visit: Payer: Self-pay | Admitting: Neurosurgery

## 2013-12-10 DIAGNOSIS — M47814 Spondylosis without myelopathy or radiculopathy, thoracic region: Secondary | ICD-10-CM

## 2013-12-19 ENCOUNTER — Telehealth: Payer: Self-pay | Admitting: Internal Medicine

## 2013-12-19 DIAGNOSIS — E039 Hypothyroidism, unspecified: Secondary | ICD-10-CM

## 2013-12-19 MED ORDER — LEVOTHYROXINE SODIUM 112 MCG PO TABS: 112.0000 ug | ORAL_TABLET | Freq: Every day | ORAL | Status: DC

## 2013-12-19 NOTE — Telephone Encounter (Signed)
rx resent  

## 2013-12-19 NOTE — Telephone Encounter (Signed)
Caller name:Lauraann Relation to BW:[id number] Call back number:[phone] Pharmacy: [facility] [phone] option 2  Reason for call: to request a refill for synthroid

## 2014-01-09 ENCOUNTER — Telehealth: Payer: Self-pay | Admitting: Internal Medicine

## 2014-01-09 DIAGNOSIS — E039 Hypothyroidism, unspecified: Secondary | ICD-10-CM

## 2014-01-09 MED ORDER — LEVOTHYROXINE SODIUM 112 MCG PO TABS: 112.0000 ug | ORAL_TABLET | Freq: Every day | ORAL | Status: DC

## 2014-01-09 NOTE — Telephone Encounter (Signed)
rx sent

## 2014-01-09 NOTE — Telephone Encounter (Signed)
Caller name: Caroly Relation to pt: Call back number: [phone] Pharmacy:[facility]  Reason for call: pt wants the refill for Rx levothyroxine (SYNTHROID, LEVOTHROID) 112 MCG tablet and to be sent to above pharmacy. Call when complete

## 2014-02-16 DIAGNOSIS — M549 Dorsalgia, unspecified: Secondary | ICD-10-CM | POA: Diagnosis not present

## 2014-04-08 ENCOUNTER — Telehealth: Payer: Self-pay | Admitting: *Deleted

## 2014-04-08 ENCOUNTER — Ambulatory Visit: Payer: Medicare Other | Admitting: Internal Medicine

## 2014-04-08 DIAGNOSIS — Z0289 Encounter for other administrative examinations: Secondary | ICD-10-CM

## 2014-04-08 NOTE — Telephone Encounter (Signed)
Please reschedule

## 2014-04-08 NOTE — Telephone Encounter (Signed)
Pt did not show for appointment 04/08/2014 at 9:30am for 6 mo fu / osteoporosis and thyroid

## 2014-04-21 NOTE — Telephone Encounter (Signed)
Pt has rescheduled appointment below for 05/06/2014

## 2014-05-06 ENCOUNTER — Ambulatory Visit: Payer: Medicare Other | Admitting: Internal Medicine

## 2014-06-16 ENCOUNTER — Encounter: Payer: Self-pay | Admitting: Internal Medicine

## 2014-06-16 ENCOUNTER — Ambulatory Visit (INDEPENDENT_AMBULATORY_CARE_PROVIDER_SITE_OTHER): Payer: Medicare Other | Admitting: Internal Medicine

## 2014-06-16 VITALS — BP 145/71 | HR 64 | Temp 98.1°F | Ht 63.0 in | Wt 129.1 lb

## 2014-06-16 DIAGNOSIS — M810 Age-related osteoporosis without current pathological fracture: Secondary | ICD-10-CM | POA: Diagnosis not present

## 2014-06-16 DIAGNOSIS — R252 Cramp and spasm: Secondary | ICD-10-CM

## 2014-06-16 DIAGNOSIS — E039 Hypothyroidism, unspecified: Secondary | ICD-10-CM | POA: Diagnosis not present

## 2014-06-16 LAB — TSH: TSH: 0.55 u[IU]/mL (ref 0.35–4.50)

## 2014-06-16 MED ORDER — ROPINIROLE HCL 0.5 MG PO TABS: 0.5000 mg | ORAL_TABLET | Freq: Every day | ORAL | Status: DC

## 2014-06-16 NOTE — Assessment & Plan Note (Signed)
Continue with Synthroid, check a TSH

## 2014-06-16 NOTE — Progress Notes (Signed)
Pre visit review using our clinic review tool, if applicable. No additional management support is needed unless otherwise documented below in the visit note. 

## 2014-06-16 NOTE — Progress Notes (Signed)
Subjective:    Patient ID: Marie Haynes, female    DOB: [birth year], 81 y.o.   MRN: [id number]  DOS:  06/16/2014 Type of visit - description : rov Interval history: Osteoporosis, did not take prolia, see assessment and plan Long history of leg cramps, wonders about Requip. See assessment and plan Hypothyroidism, good compliance with medication, due for labs   ROS Denies chest pain or difficulty breathing No nausea, vomiting, diarrhea  Past Medical History  Diagnosis Date  . Osteoporosis   . Anxiety and depression   . Spinal stenosis   . DJD (degenerative joint disease)   . Stress incontinence     (saw Urology 5-09)    . Hypothyroidism   . LEG CRAMPS, NOCTURNAL 02/22/2007    Qualifier: Diagnosis of  By: Larose Kells MD, [place]     Past Surgical History  Procedure Laterality Date  . Back surgery  [id number]  . Dilation and curettage of uterus      History   Social History  . Marital Status: Married    Spouse Name: N/A    Number of Children: 3  . Years of Education: N/A   Occupational History  . retired Psychologist, prison and probation services     Social History Main Topics  . Smoking status: Never Smoker   . Smokeless tobacco: Never Used  . Alcohol Use: Yes     Comment: Rare  . Drug Use: No  . Sexual Activity: Not on file   Other Topics Concern  . Not on file   Social History Narrative        Medication List       This list is accurate as of: 06/16/14  5:47 PM.  Always use your most recent med list.               CALCIUM + D PO  Take 3 tablets by mouth 2 (two) times daily.     cholecalciferol 400 UNITS Tabs tablet  Commonly known as:  VITAMIN D  Take 400 Units by mouth daily.     fish oil-omega-3 fatty acids 1000 MG capsule  Take 1 g by mouth 2 (two) times daily.     GLUCOSAMINE 1500 COMPLEX PO  Take 2 tablets by mouth 2 (two) times daily.     levothyroxine 112 MCG tablet  Commonly known as:  SYNTHROID, LEVOTHROID  Take 1 tablet (112 mcg total) by mouth daily.     rOPINIRole 0.5 MG tablet  Commonly known as:  REQUIP  Take 1 tablet (0.5 mg total) by mouth at bedtime.     vitamin C 500 MG tablet  Commonly known as:  ASCORBIC ACID  Take 500 mg by mouth 2 (two) times daily.     vitamin E 400 UNIT capsule  Take 400 Units by mouth 3 (three) times daily.     vitamin k 100 MCG tablet  Take 100 mcg by mouth daily.     zoster vaccine live (PF) 19400 UNT/0.65ML injection  Commonly known as:  ZOSTAVAX  Inject 19,400 Units into the skin once.           Objective:   Physical Exam BP 145/71 mmHg  Pulse 64  Temp(Src) 98.1 F (36.7 C) (Oral)  Ht 5\' 3"  (1.6 m)  Wt 129 lb 2 oz (58.571 kg)  BMI 22.88 kg/m2  SpO2 99%  General -- alert, well-developed, NAD.   Lungs -- normal respiratory effort, no intercostal retractions, no accessory muscle use, and normal breath sounds.  Heart--  normal rate, regular rhythm, no murmur.   Extremities-- no pretibial edema bilaterally   Psych-- Cognition and judgment appear intact. Cooperative with normal attention span and concentration. No anxious or depressed appearing.      Assessment & Plan:

## 2014-06-16 NOTE — Patient Instructions (Signed)
Get your blood work before you leave     Try Requip at night, if you feel you need a higher dose , please call (we are starting at a low dose)  Please come back to the office by 10-2014  for a physical exam. Come back fasting

## 2014-06-16 NOTE — Assessment & Plan Note (Addendum)
Decided not to take Prolia base on some information she read online. Currently taking calcium and other OTCs for "bone building". She is aware of other options including Forteo and wWill think about them

## 2014-06-16 NOTE — Assessment & Plan Note (Addendum)
Long history of severe, bilateral leg cramps, quinine used to help but that is not available. Currently taking tonic water, magnesium and potassium supplements prn without real help. Would like to try Requip, I think that is appropriate, prescription provided, low dose, increased dose if necessary

## 2014-06-16 NOTE — Addendum Note (Signed)
Addended by: Kathlene November E on: 06/16/2014 09:46 AM   Modules accepted: Miquel Dunn

## 2014-08-05 DIAGNOSIS — R350 Frequency of micturition: Secondary | ICD-10-CM | POA: Diagnosis not present

## 2014-08-05 DIAGNOSIS — N3946 Mixed incontinence: Secondary | ICD-10-CM | POA: Diagnosis not present

## 2014-09-11 DIAGNOSIS — R350 Frequency of micturition: Secondary | ICD-10-CM | POA: Diagnosis not present

## 2014-09-11 DIAGNOSIS — N3946 Mixed incontinence: Secondary | ICD-10-CM | POA: Diagnosis not present

## 2014-09-22 ENCOUNTER — Telehealth: Payer: Self-pay | Admitting: Internal Medicine

## 2014-09-22 NOTE — Telephone Encounter (Signed)
Pre Visit letter sent  °

## 2014-09-24 DIAGNOSIS — N3946 Mixed incontinence: Secondary | ICD-10-CM | POA: Diagnosis not present

## 2014-09-24 DIAGNOSIS — R350 Frequency of micturition: Secondary | ICD-10-CM | POA: Diagnosis not present

## 2014-09-29 ENCOUNTER — Other Ambulatory Visit: Payer: Self-pay

## 2014-10-12 ENCOUNTER — Encounter: Payer: Medicare Other | Admitting: Internal Medicine

## 2014-10-14 ENCOUNTER — Telehealth: Payer: Self-pay | Admitting: Internal Medicine

## 2014-10-14 ENCOUNTER — Encounter: Payer: Self-pay | Admitting: Internal Medicine

## 2014-10-14 NOTE — Telephone Encounter (Signed)
PT left message to reschedule appt on 10/12/14- attempts were made to reach PT - no success. Letter sent to patient. No Charge for missed appointment.

## 2014-11-13 ENCOUNTER — Ambulatory Visit (INDEPENDENT_AMBULATORY_CARE_PROVIDER_SITE_OTHER): Payer: Medicare Other | Admitting: Internal Medicine

## 2014-11-13 ENCOUNTER — Encounter: Payer: Self-pay | Admitting: Internal Medicine

## 2014-11-13 VITALS — BP 108/68 | HR 65 | Temp 98.2°F | Ht 63.0 in | Wt 131.4 lb

## 2014-11-13 DIAGNOSIS — R252 Cramp and spasm: Secondary | ICD-10-CM | POA: Diagnosis not present

## 2014-11-13 DIAGNOSIS — E039 Hypothyroidism, unspecified: Secondary | ICD-10-CM | POA: Diagnosis not present

## 2014-11-13 MED ORDER — LEVOTHYROXINE SODIUM 112 MCG PO TABS: 112.0000 ug | ORAL_TABLET | Freq: Every day | ORAL | Status: DC

## 2014-11-13 MED ORDER — GABAPENTIN 100 MG PO CAPS: 200.0000 mg | ORAL_CAPSULE | Freq: Every day | ORAL | Status: DC

## 2014-11-13 NOTE — Progress Notes (Signed)
Subjective:    Patient ID: Marie Haynes, female    DOB: 18-Mar-1932, 81 y.o.   MRN: [id number]  DOS:  11/13/2014 Type of visit - description : rov Interval history: Hypothyroidism, good compliance of medication, due for TSH Leg cramps, this is still  her main concern, tried Requip, did not help much.    Review of Systems Doing well, good compliance w/ medication. Has not seen gynecology, did not get Zostavax No chest pain or difficulty breathing  Past Medical History  Diagnosis Date  . Osteoporosis   . Anxiety and depression   . Spinal stenosis   . DJD (degenerative joint disease)   . Stress incontinence     (saw Urology 5-09)    . Hypothyroidism   . LEG CRAMPS, NOCTURNAL 02/22/2007    Qualifier: Diagnosis of  By: Larose Kells MD, [facility]a Berthold   . Urge and stress incontinence     Dr. Matilde Sprang  . Increased urinary frequency     Dr. Matilde Sprang    Past Surgical History  Procedure Laterality Date  . Back surgery  [id number]  . Dilation and curettage of uterus      History   Social History  . Marital Status: Married    Spouse Name: N/A  . Number of Children: 3  . Years of Education: N/A   Occupational History  . retired Psychologist, prison and probation services     Social History Main Topics  . Smoking status: Never Smoker   . Smokeless tobacco: Never Used  . Alcohol Use: Yes     Comment: Rare  . Drug Use: No  . Sexual Activity: Not on file   Other Topics Concern  . Not on file   Social History Narrative        Medication List       This list is accurate as of: 11/13/14  4:32 PM.  Always use your most recent med list.               CALCIUM + D PO  Take 3 tablets by mouth 2 (two) times daily.     cholecalciferol 400 UNITS Tabs tablet  Commonly known as:  VITAMIN D  Take 4,000 Units by mouth daily.     fish oil-omega-3 fatty acids 1000 MG capsule  Take 1 g by mouth 2 (two) times daily.     GLUCOSAMINE 1500 COMPLEX PO  Take 2 tablets by mouth 2 (two) times daily.     levothyroxine 112 MCG tablet  Commonly known as:  SYNTHROID, LEVOTHROID  Take 1 tablet (112 mcg total) by mouth daily.     rOPINIRole 0.5 MG tablet  Commonly known as:  REQUIP  Take 1 tablet (0.5 mg total) by mouth at bedtime.     VESICARE 5 MG tablet  Generic drug:  solifenacin  Take 1 tablet (5 mg total) by mouth daily.     vitamin C 500 MG tablet  Commonly known as:  ASCORBIC ACID  Take 500 mg by mouth 2 (two) times daily.     vitamin E 400 UNIT capsule  Take 400 Units by mouth 3 (three) times daily.     vitamin k 100 MCG tablet  Take 100 mcg by mouth daily.     zoster vaccine live (PF) 19400 UNT/0.65ML injection  Commonly known as:  ZOSTAVAX  Inject 19,400 Units into the skin once.           Objective:   Physical Exam BP 108/68 mmHg  Pulse 65  Temp(Src)  98.2 F (36.8 C) (Oral)  Ht 5\' 3"  (1.6 m)  Wt 131 lb 6 oz (59.591 kg)  BMI 23.28 kg/m2  SpO2 96%  General:   Well developed, well nourished . NAD.  HEENT:  Normocephalic . Face symmetric, atraumatic Lungs:  CTA B Normal respiratory effort, no intercostal retractions, no accessory muscle use. Heart: RRR,  no murmur.  No pretibial edema bilaterally  Skin: Not pale. Not jaundice Neurologic:  alert & oriented X3.  Speech normal, gait appropriate for age and unassisted Psych--  Cognition and judgment appear intact.  Cooperative with normal attention span and concentration.  Behavior appropriate. No anxious or depressed appearing.      Assessment & Plan:

## 2014-11-13 NOTE — Assessment & Plan Note (Signed)
Refill Synthroid, check a TSH

## 2014-11-13 NOTE — Progress Notes (Signed)
Pre visit review using our clinic review tool, if applicable. No additional management support is needed unless otherwise documented below in the visit note. 

## 2014-11-13 NOTE — Assessment & Plan Note (Signed)
Ongoing problem, Requip did not help much. Literature review, other options include  An antihistaminic such as diphenhydramine, CCBs or gabapentin. BP today is in the low side thus we agreed to try gabapentin 200 to 300 mg qhs

## 2014-11-13 NOTE — Patient Instructions (Signed)
Get your blood work before you leave    Start gabapentin 100 mg: Take 2 tablets every night If he is not helping with the leg cramps, take 3 tablets every night Call if you are not improving.

## 2014-11-14 LAB — TSH: TSH: 0.66 u[IU]/mL (ref 0.350–4.500)

## 2015-01-20 ENCOUNTER — Telehealth: Payer: Self-pay | Admitting: Internal Medicine

## 2015-01-20 NOTE — Telephone Encounter (Signed)
pre visit letter mailed 01/19/15 °

## 2015-02-05 ENCOUNTER — Telehealth: Payer: Self-pay | Admitting: *Deleted

## 2015-02-05 NOTE — Telephone Encounter (Signed)
Unable to reach patient at time of Pre-Visit Call.  Left message to confirm appointment.  

## 2015-02-09 ENCOUNTER — Ambulatory Visit (INDEPENDENT_AMBULATORY_CARE_PROVIDER_SITE_OTHER): Payer: Medicare Other | Admitting: Internal Medicine

## 2015-02-09 ENCOUNTER — Encounter: Payer: Self-pay | Admitting: Internal Medicine

## 2015-02-09 VITALS — BP 116/68 | HR 65 | Temp 97.8°F | Ht 63.0 in | Wt 129.4 lb

## 2015-02-09 DIAGNOSIS — Z01419 Encounter for gynecological examination (general) (routine) without abnormal findings: Secondary | ICD-10-CM

## 2015-02-09 DIAGNOSIS — E039 Hypothyroidism, unspecified: Secondary | ICD-10-CM

## 2015-02-09 DIAGNOSIS — M1990 Unspecified osteoarthritis, unspecified site: Secondary | ICD-10-CM

## 2015-02-09 DIAGNOSIS — E785 Hyperlipidemia, unspecified: Secondary | ICD-10-CM

## 2015-02-09 DIAGNOSIS — N393 Stress incontinence (female) (male): Secondary | ICD-10-CM

## 2015-02-09 DIAGNOSIS — Z0000 Encounter for general adult medical examination without abnormal findings: Secondary | ICD-10-CM | POA: Diagnosis not present

## 2015-02-09 DIAGNOSIS — Z09 Encounter for follow-up examination after completed treatment for conditions other than malignant neoplasm: Secondary | ICD-10-CM

## 2015-02-09 NOTE — Progress Notes (Signed)
Subjective:    Patient ID: Marie Haynes, female    DOB: 1931/10/28, 81 y.o.   MRN: [id number]  DOS:  02/09/2015 Type of visit - description :   Here for Medicare AWV:  1. Risk factors based on Past M, S, F history: reviewed 2. Physical Activities:  Exercises x 2/week, walks 3. Depression/mood: neg screening , +stress, daughter is bipolar 4. Hearing:  No problems noted or reported  5. ADL's: independent, drives  6. Fall Risk: no recent falls, prevention discussed , see AVS 7. home Safety: does feel safe at home  8. Height, weight, & visual acuity: see VS, s/p cataract surgery , doing well; due to see eye doctor, encouraged to go, declined referral 9. Counseling: provided 10. Labs ordered based on risk factors: if needed  11. Referral Coordination: if needed 12. Care Plan, see assessment and plan , written personalized plan provided , see AVS 13. Cognitive Assessment: motor skills and cognition appropriate for age 44. Care team updated 15. End-of-life care discussed, see AVS, MOST form provided   In addition, today we discussed the following: Hypothyroidism: Good compliance of medication, due for a TSH DJD: Having right-sided back pain with radiation to the right leg for the last few months, "I know what it is, it is my sciatic pain". No motor deficits. Denies any other arthritic pains at this point Stress incontinence: untreated, intolerant to medications  Review of Systems Constitutional: No fever. No chills. No unexplained wt changes. No unusual sweats  HEENT: + dental problems, sees the dentist; no ear discharge, no facial swelling, no voice changes. No eye discharge, no eye  redness , no  intolerance to light   Respiratory: No wheezing , no  difficulty breathing. No cough , no mucus production  Cardiovascular: No CP, no leg swelling , no  Palpitations  GI: no nausea, no vomiting, no diarrhea , no  abdominal pain.  No blood in the stools. No dysphagia, no odynophagia      Endocrine: No polyphagia, no polyuria , no polydipsia  GU: No dysuria, gross hematuria, difficulty urinating. Chronic problems at baseline.  Musculoskeletal: See history of present illness  Skin: No change in the color of the skin, palor , no  Rash  Allergic, immunologic: No environmental allergies , no  food allergies  Neurological: No dizziness no  syncope. No headaches. No diplopia, no slurred, no slurred speech, no motor deficits, no facial  Numbness  Hematological: No enlarged lymph nodes, no easy bruising , no unusual bleedings  Psychiatry: No suicidal ideas, no hallucinations, no beavior problems, no confusion.  No unusual/severe anxiety, no depression   Past Medical History  Diagnosis Date  . Osteoporosis   . Anxiety and depression   . Spinal stenosis   . DJD (degenerative joint disease)   . Stress incontinence     (saw Urology 5-09)    . Hypothyroidism   . LEG CRAMPS, NOCTURNAL 02/22/2007    Qualifier: Diagnosis of  By: Larose Kells MD, Alda Berthold   . Urge and stress incontinence     Dr. Matilde Sprang  . Increased urinary frequency     Dr. Matilde Sprang    Past Surgical History  Procedure Laterality Date  . Back surgery  [id number]  . Dilation and curettage of uterus      Family History  Problem Relation Age of Onset  . Heart failure Mother   . Heart attack Mother   . Colon cancer Neg Hx   . Breast cancer Neg Hx   .  Diabetes Neg Hx      Social History   Social History  . Marital Status: Married    Spouse Name: N/A  . Number of Children: 3  . Years of Education: N/A   Occupational History  . retired Psychologist, prison and probation services     Social History Main Topics  . Smoking status: Never Smoker   . Smokeless tobacco: Never Used  . Alcohol Use: Yes     Comment: Rare  . Drug Use: No  . Sexual Activity: Not on file   Other Topics Concern  . Not on file   Social History Narrative   Daughter bipolar        Medication List       This list is accurate as of: 02/09/15 11:59  PM.  Always use your most recent med list.               CALCIUM + D PO  Take 3 tablets by mouth 2 (two) times daily.     cholecalciferol 400 UNITS Tabs tablet  Commonly known as:  VITAMIN D  Take 4,000 Units by mouth daily.     fish oil-omega-3 fatty acids 1000 MG capsule  Take 1 g by mouth 2 (two) times daily.     GLUCOSAMINE 1500 COMPLEX PO  Take 2 tablets by mouth 2 (two) times daily.     levothyroxine 112 MCG tablet  Commonly known as:  SYNTHROID, LEVOTHROID  Take 1 tablet (112 mcg total) by mouth daily.     vitamin C 500 MG tablet  Commonly known as:  ASCORBIC ACID  Take 500 mg by mouth 2 (two) times daily.     vitamin E 400 UNIT capsule  Take 400 Units by mouth 3 (three) times daily.     vitamin k 100 MCG tablet  Take 100 mcg by mouth daily.           Objective:   Physical Exam BP 116/68 mmHg  Pulse 65  Temp(Src) 97.8 F (36.6 C) (Oral)  Ht 5\' 3"  (1.6 m)  Wt 129 lb 6 oz (58.684 kg)  BMI 22.92 kg/m2  SpO2 96% General:   Well developed, well nourished . NAD.  Neck:  Full range of motion. Supple. No  thyromegaly  HEENT:  Normocephalic . Face symmetric, atraumatic Lungs:  CTA B Normal respiratory effort, no intercostal retractions, no accessory muscle use. Heart: RRR,  no murmur.  No pretibial edema bilaterally  Abdomen:  Not distended, soft, non-tender. No rebound or rigidity.  Palpable aorta, nontender, no bruit MSK: No muscular atrophy on the legs Skin: Exposed areas without rash. Not pale. Not jaundice Neurologic:  alert & oriented X3.  Speech normal, gait appropriate for age and unassisted Strength symmetric and appropriate for age. DTRs symmetric, straight leg test negative  Psych: Cognition and judgment appear intact.  Cooperative with normal attention span and concentration.  Behavior appropriate. No anxious or depressed appearing.    Assessment & Plan:   Problem list >  Anxiety depression Hypothyroidism Hyperlipidemia,  mild Stress incontinence -- per urology, intol to meds  Osteoporosis MSK: --DJD --Spinal stenosis  A/P  Hypothyroidism: Good compliance with Synthroid, check a TSH Hyperlipidemia: On lifestyle modification, check FLP, BMP Stress incontinence: Sees urology regularly, usually prescribed medications are effective but also cause constipation, unable to tolerate any meds Osteoporosis: See previous comments, she has decided not to take any medication, encouraged calcium, vitamin D. Spinal stenosis: Has right-sided sciatic type back pain, she does not desire to  do further eval or referred to orthopedic, fortunately for now there is no evidence of atrophy, risk of   permanent damage discussed DJD: Other than back pain, reports no arthralgias since she changed her diet to gluten-free. Follow-up one year, may need to come back in 6 months for thyroid check.        Nocturnal leg cramps

## 2015-02-09 NOTE — Assessment & Plan Note (Addendum)
Td 2012 ;pneumonia shot 1998 and 2011; prevnar 2015 zostavax-- Rx provided before  Last Colonoscopy:  06/05/1998--actual report not available  Colon bx -- hyperrplastic polyp   Dr Collene Mares [phone], states she won't do another one   Female care: rec to see  gyn (Dr Raphael Gibney) -- referral enter [facility]--neg bi rads --11/2012 --- declined referral   Diet , exercise discussed

## 2015-02-09 NOTE — Progress Notes (Signed)
Pre visit review using our clinic review tool, if applicable. No additional management support is needed unless otherwise documented below in the visit note. 

## 2015-02-09 NOTE — Patient Instructions (Signed)
Get your blood work before you leave      Please consider visit these websites for more information:  www.begintheconversation.org  theconversationproject.org   Next  visiting one year, fasting for a physical exam     Fall Prevention and Home Safety Falls cause injuries and can affect all age groups. It is possible to use preventive measures to significantly decrease the likelihood of falls. There are many simple measures which can make your home safer and prevent falls. OUTDOORS  Repair cracks and edges of walkways and driveways.  Remove high doorway thresholds.  Trim shrubbery on the main path into your home.  Have good outside lighting.  Clear walkways of tools, rocks, debris, and clutter.  Check that handrails are not broken and are securely fastened. Both sides of steps should have handrails.  Have leaves, snow, and ice cleared regularly.  Use sand or salt on walkways during winter months.  In the garage, clean up grease or oil spills. BATHROOM  Install night lights.  Install grab bars by the toilet and in the tub and shower.  Use non-skid mats or decals in the tub or shower.  Place a plastic non-slip stool in the shower to sit on, if needed.  Keep floors dry and clean up all water on the floor immediately.  Remove soap buildup in the tub or shower on a regular basis.  Secure bath mats with non-slip, double-sided rug tape.  Remove throw rugs and tripping hazards from the floors. BEDROOMS  Install night lights.  Make sure a bedside light is easy to reach.  Do not use oversized bedding.  Keep a telephone by your bedside.  Have a firm chair with side arms to use for getting dressed.  Remove throw rugs and tripping hazards from the floor. KITCHEN  Keep handles on pots and pans turned toward the center of the stove. Use back burners when possible.  Clean up spills quickly and allow time for drying.  Avoid walking on wet floors.  Avoid hot  utensils and knives.  Position shelves so they are not too high or low.  Place commonly used objects within easy reach.  If necessary, use a sturdy step stool with a grab bar when reaching.  Keep electrical cables out of the way.  Do not use floor polish or wax that makes floors slippery. If you must use wax, use non-skid floor wax.  Remove throw rugs and tripping hazards from the floor. STAIRWAYS  Never leave objects on stairs.  Place handrails on both sides of stairways and use them. Fix any loose handrails. Make sure handrails on both sides of the stairways are as long as the stairs.  Check carpeting to make sure it is firmly attached along stairs. Make repairs to worn or loose carpet promptly.  Avoid placing throw rugs at the top or bottom of stairways, or properly secure the rug with carpet tape to prevent slippage. Get rid of throw rugs, if possible.  Have an electrician put in a light switch at the top and bottom of the stairs. OTHER FALL PREVENTION TIPS  Wear low-heel or rubber-soled shoes that are supportive and fit well. Wear closed toe shoes.  When using a stepladder, make sure it is fully opened and both spreaders are firmly locked. Do not climb a closed stepladder.  Add color or contrast paint or tape to grab bars and handrails in your home. Place contrasting color strips on first and last steps.  Learn and use mobility aids as  needed. Install an electrical emergency response system.  Turn on lights to avoid dark areas. Replace light bulbs that burn out immediately. Get light switches that glow.  Arrange furniture to create clear pathways. Keep furniture in the same place.  Firmly attach carpet with non-skid or double-sided tape.  Eliminate uneven floor surfaces.  Select a carpet pattern that does not visually hide the edge of steps.  Be aware of all pets. OTHER HOME SAFETY TIPS  Set the water temperature for 120 F (48.8 C).  Keep emergency numbers on  or near the telephone.  Keep smoke detectors on every level of the home and near sleeping areas. Document Released: 05/12/2002 Document Revised: 11/21/2011 Document Reviewed: 08/11/2011 [facility] Patient Information 2015 [place], [place]. This information is not intended to replace advice given to you by your health care provider. Make sure you discuss any questions you have with your health care provider.   Preventive Care for Adults Ages 48 and over  Blood pressure check.** / Every 1 to 2 years.  Lipid and cholesterol check.**/ Every 5 years beginning at age 60.  Lung cancer screening. / Every year if you are aged 15-80 years and have a 30-pack-year history of smoking and currently smoke or have quit within the past 15 years. Yearly screening is stopped once you have quit smoking for at least 15 years or develop a health problem that would prevent you from having lung cancer treatment.  Fecal occult blood test (FOBT) of stool. / Every year beginning at age 47 and continuing until age 16. You may not have to do this test if you get a colonoscopy every 10 years.  Flexible sigmoidoscopy** or colonoscopy.** / Every 5 years for a flexible sigmoidoscopy or every 10 years for a colonoscopy beginning at age 70 and continuing until age 36.  Hepatitis C blood test.** / For all people born from [birth year] through 1963 and any individual with known risks for hepatitis C.  Abdominal aortic aneurysm (AAA) screening.** / A one-time screening for ages 65 to 58 years who are current or former smokers.  Skin self-exam. / Monthly.  Influenza vaccine. / Every year.  Tetanus, diphtheria, and acellular pertussis (Tdap/Td) vaccine.** / 1 dose of Td every 10 years.  Varicella vaccine.** / Consult your health care provider.  Zoster vaccine.** / 1 dose for adults aged 65 years or older.  Pneumococcal 13-valent conjugate (PCV13) vaccine.** / Consult your health care provider.  Pneumococcal polysaccharide (PPSV23)  vaccine.** / 1 dose for all adults aged 28 years and older.  Meningococcal vaccine.** / Consult your health care provider.  Hepatitis A vaccine.** / Consult your health care provider.  Hepatitis B vaccine.** / Consult your health care provider.  Haemophilus influenzae type b (Hib) vaccine.** / Consult your health care provider. **Family history and personal history of risk and conditions may change your health care provider's recommendations. Document Released: 07/18/2001 Document Revised: 05/27/2013 Document Reviewed: 10/17/2010 [facility] Patient Information 2015 [place], [place]. This information is not intended to replace advice given to you by your health care provider. Make sure you discuss any questions you have with your health care provider.

## 2015-02-10 DIAGNOSIS — Z09 Encounter for follow-up examination after completed treatment for conditions other than malignant neoplasm: Secondary | ICD-10-CM | POA: Insufficient documentation

## 2015-02-10 LAB — BASIC METABOLIC PANEL
BUN: 17 mg/dL (ref 6–23)
CALCIUM: 9.8 mg/dL (ref 8.4–10.5)
CO2: 27 meq/L (ref 19–32)
CREATININE: 0.76 mg/dL (ref 0.40–1.20)
Chloride: 102 mEq/L (ref 96–112)
GFR: 77.54 mL/min (ref >60.00)
GLUCOSE: 77 mg/dL (ref 70–99)
Potassium: 3.9 mEq/L (ref 3.5–5.1)
Sodium: 139 mEq/L (ref 135–145)

## 2015-02-10 LAB — LIPID PANEL
CHOL/HDL RATIO: 3
Cholesterol: 241 mg/dL — ABNORMAL HIGH (ref 0–200)
HDL: 93.8 mg/dL (ref >39.00)
LDL Cholesterol: 134 mg/dL — ABNORMAL HIGH (ref 0–99)
NONHDL: 146.83
Triglycerides: 63 mg/dL (ref 0.0–149.0)
VLDL: 12.6 mg/dL (ref 0.0–40.0)

## 2015-02-10 LAB — C-REACTIVE PROTEIN: CRP: 0.2 mg/dL — AB (ref 0.5–20.0)

## 2015-02-10 LAB — TSH: TSH: 0.86 u[IU]/mL (ref 0.35–4.50)

## 2015-02-10 NOTE — Assessment & Plan Note (Signed)
Hypothyroidism: Good compliance with Synthroid, check a TSH Hyperlipidemia: On lifestyle modification, check FLP, BMP Stress incontinence: Sees urology regularly, usually prescribed medications are effective but also cause constipation, unable to tolerate any meds Osteoporosis: See previous comments, she has decided not to take any medication, encouraged calcium, vitamin D. Spinal stenosis: Has right-sided sciatic type back pain, she does not desire to do further eval or referred to orthopedic, fortunately for now there is no evidence of atrophy, risk of   permanent damage discussed DJD: Other than back pain, reports no arthralgias since she changed her diet to gluten-free. Follow-up one year, may need to come back in 6 months for thyroid check.

## 2015-02-23 ENCOUNTER — Telehealth: Payer: Self-pay | Admitting: Internal Medicine

## 2015-02-23 DIAGNOSIS — E039 Hypothyroidism, unspecified: Secondary | ICD-10-CM

## 2015-02-23 MED ORDER — LEVOTHYROXINE SODIUM 112 MCG PO TABS: 112.0000 ug | ORAL_TABLET | Freq: Every day | ORAL | Status: DC

## 2015-02-23 NOTE — Telephone Encounter (Signed)
Rx sent to [facility] pharmacy.  

## 2015-02-23 NOTE — Telephone Encounter (Signed)
Caller name: Waynard Edwards, [place] - [address]  Reason for call: pharmacy requesting a refill levothyroxine (SYNTHROID, LEVOTHROID) 112 MCG tablet

## 2015-04-14 ENCOUNTER — Telehealth: Payer: Self-pay | Admitting: Internal Medicine

## 2015-04-14 NOTE — Telephone Encounter (Signed)
Appt noted.  No further actions required at this time.

## 2015-04-14 NOTE — Telephone Encounter (Signed)
[facility] [place]    --------------------------------------------------------------------------------   Patient Name: Marie Haynes  DOB: [birth year]    Initial Comment Caller states she has been dizzy - which caused off/ on nausea.        Nurse Assessment  Nurse: Genoveva Ill, RN, Lattie Haw Date/Time (Eastern Time): 04/14/2015 3:07:02 PM  Confirm and document reason for call. If symptomatic, describe symptoms. ---caller states she is not nauseated now ,but has been with dizziness and is dizzy now; doesn't feel she can drive; dizzy since night before last    Has the patient traveled out of the country within the last 30 days? ---No    Does the patient have any new or worsening symptoms? ---Yes    Will a triage be completed? ---Yes    Related visit to physician within the last 2 weeks? ---No    Does the PT have any chronic conditions? (i.e. diabetes, asthma, etc.) ---Yes    List chronic conditions. ---hypothyroidism           Guidelines      Guideline Title Affirmed Question Affirmed Notes  Dizziness - Lightheadedness [1] MODERATE dizziness (e.g., interferes with normal activities) AND [2] has NOT been evaluated by physician for this (Exception: dizziness caused by heat exposure, sudden standing, or poor fluid intake)      Final Disposition User    See Physician within 24 Hours [place], RN, Lattie Haw      Comments  Appt scheduled for tomorrow 04/15/15 at 9:30 am with Dr. Larose Kells    Referrals  REFERRED TO PCP OFFICE    Disagree/Comply: Comply

## 2015-04-15 ENCOUNTER — Other Ambulatory Visit: Payer: Self-pay | Admitting: Internal Medicine

## 2015-04-15 ENCOUNTER — Ambulatory Visit (INDEPENDENT_AMBULATORY_CARE_PROVIDER_SITE_OTHER): Payer: Medicare Other | Admitting: Internal Medicine

## 2015-04-15 ENCOUNTER — Encounter: Payer: Self-pay | Admitting: Internal Medicine

## 2015-04-15 VITALS — BP 118/76 | HR 76 | Temp 98.2°F | Ht 63.0 in | Wt 130.0 lb

## 2015-04-15 DIAGNOSIS — G459 Transient cerebral ischemic attack, unspecified: Secondary | ICD-10-CM

## 2015-04-15 DIAGNOSIS — Z09 Encounter for follow-up examination after completed treatment for conditions other than malignant neoplasm: Secondary | ICD-10-CM

## 2015-04-15 LAB — CBC WITH DIFFERENTIAL/PLATELET
BASOS ABS: 0 10*3/uL (ref 0.0–0.1)
Basophils Relative: 0.7 % (ref 0.0–3.0)
EOS ABS: 0.3 10*3/uL (ref 0.0–0.7)
Eosinophils Relative: 5.4 % — ABNORMAL HIGH (ref 0.0–5.0)
HEMATOCRIT: 38.7 % (ref 36.0–46.0)
HEMOGLOBIN: 12.9 g/dL (ref 12.0–15.0)
LYMPHS PCT: 22.1 % (ref 12.0–46.0)
Lymphs Abs: 1.2 10*3/uL (ref 0.7–4.0)
MCHC: 33.2 g/dL (ref 30.0–36.0)
MCV: 91.8 fl (ref 78.0–100.0)
Monocytes Absolute: 0.7 10*3/uL (ref 0.1–1.0)
Monocytes Relative: 13.2 % — ABNORMAL HIGH (ref 3.0–12.0)
Neutro Abs: 3.1 10*3/uL (ref 1.4–7.7)
Neutrophils Relative %: 58.6 % (ref 43.0–77.0)
PLATELETS: 220 10*3/uL (ref 150.0–400.0)
RBC: 4.22 Mil/uL (ref 3.87–5.11)
RDW: 14 % (ref 11.5–15.5)
WBC: 5.4 10*3/uL (ref 4.0–10.5)

## 2015-04-15 LAB — BASIC METABOLIC PANEL
BUN: 16 mg/dL (ref 6–23)
CALCIUM: 10 mg/dL (ref 8.4–10.5)
CO2: 32 mEq/L (ref 19–32)
CREATININE: 0.75 mg/dL (ref 0.40–1.20)
Chloride: 103 mEq/L (ref 96–112)
GFR: 78.7 mL/min (ref >60.00)
Glucose, Bld: 55 mg/dL — ABNORMAL LOW (ref 70–99)
Potassium: 4.5 mEq/L (ref 3.5–5.1)
Sodium: 141 mEq/L (ref 135–145)

## 2015-04-15 LAB — SEDIMENTATION RATE: Sed Rate: 18 mm/hr (ref 0–22)

## 2015-04-15 NOTE — Progress Notes (Signed)
Subjective:    Patient ID: Marie Haynes, female    DOB: 1932/03/10, 81 y.o.   MRN: [id number]  DOS:  04/15/2015 Type of visit - description : Acute visit, here with her has been Interval history:  Symptoms started 04/12/2015 at 6:30 PM suddenly--- She become dizzy, sx are not described as spinning, associated with mild nausea but no vomiting. In addition, she had a number of other symptoms: Numbness in her hands and feet, vision was "slightly off" but no actual blurred vision or amaurosis fugax. Her hearing was also "slightly off". All the symptoms resolved within a matter of hours but she continued to feel constantly dizzy until last night. Symptoms were not triggering by moving her head or standing up. She did experience generalized weakness to the point that she stay in bed and her husband had to help her walking. Denies difficulty coordinating her legs or focal weaknesses.   also, complaining of leg cramps.  Review of Systems  No chest pain, difficulty breathing or palpitations. No blood in the stools. No diplopia, slurred speech, motor deficits or facial numbness. Left sided headache, mostly in the back of the head. Resolved.  Past Medical History  Diagnosis Date  . Osteoporosis   . Anxiety and depression   . Spinal stenosis   . DJD (degenerative joint disease)   . Stress incontinence     (saw Urology 5-09)    . Hypothyroidism   . LEG CRAMPS, NOCTURNAL 02/22/2007    Qualifier: Diagnosis of  By: Larose Kells MD, Alda Berthold   . Urge and stress incontinence     Dr. Matilde Sprang  . Increased urinary frequency     Dr. Matilde Sprang    Past Surgical History  Procedure Laterality Date  . Back surgery  [id number]  . Dilation and curettage of uterus      Social History   Social History  . Marital Status: Married    Spouse Name: N/A  . Number of Children: 3  . Years of Education: N/A   Occupational History  . retired Psychologist, prison and probation services     Social History Main Topics  . Smoking status:  Never Smoker   . Smokeless tobacco: Never Used  . Alcohol Use: Yes     Comment: Rare  . Drug Use: No  . Sexual Activity: Not on file   Other Topics Concern  . Not on file   Social History Narrative   Daughter bipolar        Medication List       This list is accurate as of: 04/15/15  2:54 PM.  Always use your most recent med list.               CALCIUM + D PO  Take 3 tablets by mouth 2 (two) times daily.     cholecalciferol 400 UNITS Tabs tablet  Commonly known as:  VITAMIN D  Take 4,000 Units by mouth daily.     fish oil-omega-3 fatty acids 1000 MG capsule  Take 1 g by mouth 2 (two) times daily.     GLUCOSAMINE 1500 COMPLEX PO  Take 2 tablets by mouth 2 (two) times daily.     levothyroxine 112 MCG tablet  Commonly known as:  SYNTHROID, LEVOTHROID  Take 1 tablet (112 mcg total) by mouth daily before breakfast.     vitamin C 500 MG tablet  Commonly known as:  ASCORBIC ACID  Take 500 mg by mouth 2 (two) times daily.     vitamin  E 400 UNIT capsule  Take 400 Units by mouth 3 (three) times daily.     vitamin k 100 MCG tablet  Take 100 mcg by mouth daily.           Objective:   Physical Exam BP 118/76 mmHg  Pulse 76  Temp(Src) 98.2 F (36.8 C) (Oral)  Ht 5\' 3"  (1.6 m)  Wt 130 lb (58.968 kg)  BMI 23.03 kg/m2  SpO2 99% General:   Well developed, well nourished . NAD.  HEENT:  Normocephalic . Face symmetric, atraumatic. Neck: No thyromegaly, normal carotid pulses Lungs:  CTA B Normal respiratory effort, no intercostal retractions, no accessory muscle use. Heart: RRR,  no murmur.  No pretibial edema bilaterally  Skin: Not pale. Not jaundice Neurologic:  alert & oriented X3.  Speech normal, gait appropriate for age and unassisted Motor and DTRs symmetric EOMI, pupils equal and reactive. Tongue midline, face symmetric. Normal coordination Psych--  Cognition and judgment appear intact.  Cooperative with normal attention span and concentration.    Behavior appropriate. No anxious or depressed appearing.      Assessment & Plan:   Assessment Anxiety depression Hypothyroidism Hyperlipidemia, mild Stress incontinence -- per urology, intol to meds  Osteoporosis MSK: --DJD --Spinal stenosis Severe leg cramps: Intolerant to Requip ,gabapentin (dizziness) , tonic water helps some  PLAN: Dizziness, suspect TIA. Cardiovascular risk factors: Not a smoker, no hypertension, mother had CAD, last LDL 134 EKG today: Poor RV progression, no acute changes, no change from previous EKG Plan: Aspirin 81, MRI MRA of the brain, carotid ultrasound, CBC, BMP, sedimentation rate, RTC 3 weeks ( fasting, recheck cholesterol), ER if symptoms resurface Leg cramps: Also complained of severe sx, tonic water  helps to some extent. Recommend to add OTC Benadryl

## 2015-04-15 NOTE — Patient Instructions (Signed)
Get your blood work before you leave    If you have recurrent or severe symptoms: Call or go to the ER  For leg cramps take a low dose of OTC Benadryl   Next visit  for a   checkup in 3 weeks, fasting. (25 minutes) Please schedule an appointment at the front desk

## 2015-04-15 NOTE — Assessment & Plan Note (Signed)
Dizziness, suspect TIA. Cardiovascular risk factors: Not a smoker, no hypertension, mother had CAD, last LDL 134 EKG today: Poor RV progression, no acute changes, no change from previous EKG Plan: Aspirin 81, MRI MRA of the brain, carotid ultrasound, CBC, BMP, sedimentation rate, RTC 3 weeks ( fasting, recheck cholesterol), ER if symptoms resurface Leg cramps: Also complained of severe sx, tonic water  helps to some extent. Recommend to add OTC Benadryl

## 2015-04-15 NOTE — Progress Notes (Signed)
Pre visit review using our clinic review tool, if applicable. No additional management support is needed unless otherwise documented below in the visit note. 

## 2015-05-06 ENCOUNTER — Ambulatory Visit (INDEPENDENT_AMBULATORY_CARE_PROVIDER_SITE_OTHER): Payer: Medicare Other | Admitting: Internal Medicine

## 2015-05-06 ENCOUNTER — Encounter: Payer: Self-pay | Admitting: Internal Medicine

## 2015-05-06 VITALS — BP 116/76 | HR 82 | Temp 98.0°F | Ht 63.0 in | Wt 129.5 lb

## 2015-05-06 DIAGNOSIS — E785 Hyperlipidemia, unspecified: Secondary | ICD-10-CM | POA: Diagnosis not present

## 2015-05-06 DIAGNOSIS — G459 Transient cerebral ischemic attack, unspecified: Secondary | ICD-10-CM

## 2015-05-06 DIAGNOSIS — R1011 Right upper quadrant pain: Secondary | ICD-10-CM

## 2015-05-06 DIAGNOSIS — E162 Hypoglycemia, unspecified: Secondary | ICD-10-CM

## 2015-05-06 DIAGNOSIS — Z09 Encounter for follow-up examination after completed treatment for conditions other than malignant neoplasm: Secondary | ICD-10-CM

## 2015-05-06 LAB — URINALYSIS, ROUTINE W REFLEX MICROSCOPIC
Bilirubin Urine: NEGATIVE
HGB URINE DIPSTICK: NEGATIVE
KETONES UR: NEGATIVE
LEUKOCYTES UA: NEGATIVE
NITRITE: NEGATIVE
RBC / HPF: NONE SEEN (ref 0–?)
Specific Gravity, Urine: 1.01 (ref 1.000–1.030)
TOTAL PROTEIN, URINE-UPE24: NEGATIVE
URINE GLUCOSE: NEGATIVE
Urobilinogen, UA: 0.2 (ref 0.0–1.0)
WBC, UA: NONE SEEN (ref 0–?)
pH: 6.5 (ref 5.0–8.0)

## 2015-05-06 LAB — LIPID PANEL
Cholesterol: 209 mg/dL — ABNORMAL HIGH (ref 0–200)
HDL: 94.5 mg/dL (ref >39.00)
LDL Cholesterol: 105 mg/dL — ABNORMAL HIGH (ref 0–99)
NONHDL: 114.97
Total CHOL/HDL Ratio: 2
Triglycerides: 49 mg/dL (ref 0.0–149.0)
VLDL: 9.8 mg/dL (ref 0.0–40.0)

## 2015-05-06 LAB — POCT CBG (FASTING - GLUCOSE)-MANUAL ENTRY: GLUCOSE FASTING, POC: 90 mg/dL (ref 70–99)

## 2015-05-06 NOTE — Progress Notes (Signed)
Subjective:    Patient ID: Marie Haynes, female    DOB: [birth year], 81 y.o.   MRN: [id number]  DOS:  05/06/2015 Type of visit - description : Follow-up previous visit Interval history:  TIA: No further symptoms. MRI and carotid ultrasound pending Also, reported that a week ago developed a persistent right upper quadrant pain with radiation to the back, associated with some nausea and chills. Only in one occasion it got worse after eating. The patient gradually subsided and she is pain-free for the last 30 hours   Review of Systems No fever chills No dysuria, gross hematuria difficulty urinating No headache, diplopia or slurred speech.  Past Medical History  Diagnosis Date  . Osteoporosis   . Anxiety and depression   . Spinal stenosis   . DJD (degenerative joint disease)   . Stress incontinence     (saw Urology 5-09)    . Hypothyroidism   . LEG CRAMPS, NOCTURNAL 02/22/2007    Qualifier: Diagnosis of  By: Larose Kells MD, Alda Berthold   . Urge and stress incontinence     Dr. Matilde Sprang  . Increased urinary frequency     Dr. Matilde Sprang    Past Surgical History  Procedure Laterality Date  . Back surgery  [id number]  . Dilation and curettage of uterus      Social History   Social History  . Marital Status: Married    Spouse Name: N/A  . Number of Children: 3  . Years of Education: N/A   Occupational History  . retired Psychologist, prison and probation services     Social History Main Topics  . Smoking status: Never Smoker   . Smokeless tobacco: Never Used  . Alcohol Use: Yes     Comment: Rare  . Drug Use: No  . Sexual Activity: Not on file   Other Topics Concern  . Not on file   Social History Narrative   Daughter bipolar        Medication List       This list is accurate as of: 05/06/15 11:59 PM.  Always use your most recent med list.               CALCIUM + D PO  Take 3 tablets by mouth 2 (two) times daily.     cholecalciferol 400 UNITS Tabs tablet  Commonly known as:  VITAMIN D    Take 4,000 Units by mouth daily.     fish oil-omega-3 fatty acids 1000 MG capsule  Take 1 g by mouth 2 (two) times daily.     GLUCOSAMINE 1500 COMPLEX PO  Take 2 tablets by mouth 2 (two) times daily.     levothyroxine 112 MCG tablet  Commonly known as:  SYNTHROID, LEVOTHROID  Take 1 tablet (112 mcg total) by mouth daily before breakfast.     vitamin C 500 MG tablet  Commonly known as:  ASCORBIC ACID  Take 500 mg by mouth 2 (two) times daily.     vitamin E 400 UNIT capsule  Take 400 Units by mouth 3 (three) times daily.     vitamin k 100 MCG tablet  Take 100 mcg by mouth daily.           Objective:   Physical Exam BP 116/76 mmHg  Pulse 82  Temp(Src) 98 F (36.7 C) (Oral)  Ht 5\' 3"  (1.6 m)  Wt 129 lb 8 oz (58.741 kg)  BMI 22.95 kg/m2  SpO2 99% General:   Well developed, well nourished . NAD.  HEENT:  Normocephalic . Face symmetric, atraumatic Lungs:  CTA B Normal respiratory effort, no intercostal retractions, no accessory muscle use. Heart: RRR,  no murmur.  no pretibial edema bilaterally  Abdomen:  Not distended, soft, non-tender. No rebound or rigidity. Palpable aorta upper abdomen, question of bruit. RUQ no TTP Skin: Not pale. Not jaundice Neurologic:  alert & oriented X3.  Speech normal, gait appropriate for age and unassisted. Motor symmetric Psych--  Cognition and judgment appear intact.  Cooperative with normal attention span and concentration.  Behavior appropriate. No anxious or depressed appearing.    Assessment & Plan:   Assessment Anxiety depression Hypothyroidism Hyperlipidemia, mild TIA 04-2015 Stress incontinence -- per urology, intol to meds  Osteoporosis MSK: --DJD --Spinal stenosis Severe leg cramps: Intolerant to Requip ,gabapentin (dizziness) , tonic water helps some Palpable Ao: [place] (-) AAA 2014  PLAN: Suspected TIA: Asymptomatic since last visit, MRI, MRA and carotid ultrasound pending, we are calling radiology to f/u on  that. Will also rx a ECHO  Check a cholesterol panel  RUQ abdominal pain: Suspicious for a gallbladder stone. Check a ultrasound and a UA. Last blood sugar was low, recheck in today: 90 (fasting)  RTC 4 months from today

## 2015-05-06 NOTE — Progress Notes (Signed)
Pre visit review using our clinic review tool, if applicable. No additional management support is needed unless otherwise documented below in the visit note. 

## 2015-05-06 NOTE — Patient Instructions (Signed)
Get your blood work before you leave   We are ordering a MRI, carotid ultrasound and echocardiogram If you dare not scheduled within a week please call this office.    Next visit  for a checkup in 4 months   Please schedule an appointment at the front desk

## 2015-05-07 ENCOUNTER — Ambulatory Visit (HOSPITAL_BASED_OUTPATIENT_CLINIC_OR_DEPARTMENT_OTHER)
Admission: RE | Admit: 2015-05-07 | Discharge: 2015-05-07 | Disposition: A | Discharge status: Home / Self Care | Payer: Medicare Other | Source: Ambulatory Visit | Attending: Internal Medicine | Admitting: Internal Medicine

## 2015-05-07 DIAGNOSIS — N281 Cyst of kidney, acquired: Secondary | ICD-10-CM | POA: Diagnosis not present

## 2015-05-07 DIAGNOSIS — R1011 Right upper quadrant pain: Secondary | ICD-10-CM | POA: Diagnosis not present

## 2015-05-07 DIAGNOSIS — K8020 Calculus of gallbladder without cholecystitis without obstruction: Secondary | ICD-10-CM | POA: Insufficient documentation

## 2015-05-07 NOTE — Assessment & Plan Note (Signed)
Suspected TIA: Asymptomatic since last visit, MRI, MRA and carotid ultrasound pending, we are calling radiology to f/u on that. Will also rx a ECHO  Check a cholesterol panel  RUQ abdominal pain: Suspicious for a gallbladder stone. Check a ultrasound and a UA. Last blood sugar was low, recheck in today: 90 (fasting)  RTC 4 months from today

## 2015-05-13 ENCOUNTER — Telehealth: Payer: Self-pay | Admitting: Internal Medicine

## 2015-05-13 NOTE — Telephone Encounter (Signed)
Relation to [id number] Call back number:[phone]  Reason for call:  Patient inquiring about U/S results

## 2015-05-13 NOTE — Telephone Encounter (Addendum)
error:[id number] ° °

## 2015-05-14 MED ORDER — ATORVASTATIN CALCIUM 10 MG PO TABS: 10.0000 mg | ORAL_TABLET | Freq: Every day | ORAL | Status: DC

## 2015-05-14 NOTE — Telephone Encounter (Signed)
LMOM informing Pt of [place] results and lab results. Informed her to call if interested in Surgical referral and to call and schedule lab appt to be completed in 6 weeks.

## 2015-05-14 NOTE — Addendum Note (Signed)
Addended byDamita Dunnings D on: 05/14/2015 10:35 AM   Modules accepted: Orders

## 2015-05-17 ENCOUNTER — Ambulatory Visit
Admission: RE | Admit: 2015-05-17 | Discharge: 2015-05-17 | Disposition: A | Discharge status: Home / Self Care | Payer: Medicare Other | Source: Ambulatory Visit | Attending: Internal Medicine | Admitting: Internal Medicine

## 2015-05-17 DIAGNOSIS — R531 Weakness: Secondary | ICD-10-CM | POA: Diagnosis not present

## 2015-05-17 DIAGNOSIS — H538 Other visual disturbances: Secondary | ICD-10-CM | POA: Diagnosis not present

## 2015-05-17 DIAGNOSIS — G459 Transient cerebral ischemic attack, unspecified: Secondary | ICD-10-CM

## 2015-06-06 DIAGNOSIS — K8020 Calculus of gallbladder without cholecystitis without obstruction: Secondary | ICD-10-CM

## 2015-06-06 HISTORY — DX: Calculus of gallbladder without cholecystitis without obstruction: K80.20

## 2015-07-15 ENCOUNTER — Telehealth: Payer: Self-pay

## 2015-07-15 NOTE — Telephone Encounter (Signed)
Letter printed and mailed to Pt.  

## 2015-07-15 NOTE — Telephone Encounter (Signed)
-----   Message from Colon Branch, MD sent at 07/15/2015 12:54 PM EST ----- Regarding: Send a letter Marie Haynes,  few weeks ago we recommended to take a cholesterol medication. It is time to see if that is working. Please come back to the office and let as do some blood work. Please call if you have questions.

## 2015-08-26 DIAGNOSIS — L601 Onycholysis: Secondary | ICD-10-CM | POA: Diagnosis not present

## 2015-08-26 DIAGNOSIS — L603 Nail dystrophy: Secondary | ICD-10-CM | POA: Diagnosis not present

## 2015-08-26 DIAGNOSIS — L821 Other seborrheic keratosis: Secondary | ICD-10-CM | POA: Diagnosis not present

## 2015-09-06 ENCOUNTER — Encounter: Payer: Self-pay | Admitting: Internal Medicine

## 2015-09-06 ENCOUNTER — Ambulatory Visit (INDEPENDENT_AMBULATORY_CARE_PROVIDER_SITE_OTHER): Payer: Medicare Other | Admitting: Internal Medicine

## 2015-09-06 VITALS — BP 118/76 | HR 66 | Temp 97.5°F | Ht 63.0 in | Wt 127.2 lb

## 2015-09-06 DIAGNOSIS — K8020 Calculus of gallbladder without cholecystitis without obstruction: Secondary | ICD-10-CM | POA: Diagnosis not present

## 2015-09-06 DIAGNOSIS — E785 Hyperlipidemia, unspecified: Secondary | ICD-10-CM

## 2015-09-06 DIAGNOSIS — R1011 Right upper quadrant pain: Secondary | ICD-10-CM

## 2015-09-06 DIAGNOSIS — G459 Transient cerebral ischemic attack, unspecified: Secondary | ICD-10-CM | POA: Diagnosis not present

## 2015-09-06 DIAGNOSIS — Z09 Encounter for follow-up examination after completed treatment for conditions other than malignant neoplasm: Secondary | ICD-10-CM

## 2015-09-06 LAB — ALT: ALT: 15 U/L (ref 0–35)

## 2015-09-06 LAB — LIPID PANEL
CHOL/HDL RATIO: 2
Cholesterol: 243 mg/dL — ABNORMAL HIGH (ref 0–200)
HDL: 101.2 mg/dL (ref >39.00)
LDL Cholesterol: 130 mg/dL — ABNORMAL HIGH (ref 0–99)
NONHDL: 141.45
Triglycerides: 55 mg/dL (ref 0.0–149.0)
VLDL: 11 mg/dL (ref 0.0–40.0)

## 2015-09-06 LAB — AST: AST: 18 U/L (ref 0–37)

## 2015-09-06 NOTE — Progress Notes (Signed)
Subjective:    Patient ID: Marie Haynes, female    DOB: 11-10-1931, 82 y.o.   MRN: [id number]  DOS:  09/06/2015 Type of visit - description :  Routine checkup Interval history: High cholesterol: Decided not to take Lipitor because her husband had problems with it before, taking red yeast rice, likes her cholesterol checked Still has right upper quadrant and epigastric pain at night, no associated symptoms, not immediately postprandial sx but few hours after dinner . TIA: No further symptoms    Review of Systems   Past Medical History  Diagnosis Date  . Osteoporosis   . Anxiety and depression   . Spinal stenosis   . DJD (degenerative joint disease)   . Stress incontinence     (saw Urology 5-09)    . Hypothyroidism   . LEG CRAMPS, NOCTURNAL 02/22/2007    Qualifier: Diagnosis of  By: Larose Kells MD, Alda Berthold   . Urge and stress incontinence     Dr. Matilde Sprang  . Increased urinary frequency     Dr. Matilde Sprang    Past Surgical History  Procedure Laterality Date  . Back surgery  [id number]  . Dilation and curettage of uterus      Social History   Social History  . Marital Status: Married    Spouse Name: N/A  . Number of Children: 3  . Years of Education: N/A   Occupational History  . retired Psychologist, prison and probation services     Social History Main Topics  . Smoking status: Never Smoker   . Smokeless tobacco: Never Used  . Alcohol Use: Yes     Comment: Rare  . Drug Use: No  . Sexual Activity: Not on file   Other Topics Concern  . Not on file   Social History Narrative   Daughter bipolar        Medication List       This list is accurate as of: 09/06/15  2:02 PM.  Always use your most recent med list.               CALCIUM + D PO  Take 3 tablets by mouth 2 (two) times daily.     cholecalciferol 400 units Tabs tablet  Commonly known as:  VITAMIN D  Take 4,000 Units by mouth daily.     fish oil-omega-3 fatty acids 1000 MG capsule  Take 1 g by mouth 2 (two) times daily.     GLUCOSAMINE 1500 COMPLEX PO  Take 2 tablets by mouth 2 (two) times daily.     levothyroxine 112 MCG tablet  Commonly known as:  SYNTHROID, LEVOTHROID  Take 1 tablet (112 mcg total) by mouth daily before breakfast.     RED YEAST RICE PO  Take 1 tablet by mouth daily.     vitamin C 500 MG tablet  Commonly known as:  ASCORBIC ACID  Take 500 mg by mouth 2 (two) times daily.     vitamin E 400 UNIT capsule  Take 400 Units by mouth 3 (three) times daily.     vitamin k 100 MCG tablet  Take 100 mcg by mouth daily.           Objective:   Physical Exam BP 118/76 mmHg  Pulse 66  Temp(Src) 97.5 F (36.4 C) (Oral)  Ht 5\' 3"  (1.6 m)  Wt 127 lb 4 oz (57.72 kg)  BMI 22.55 kg/m2  SpO2 99% General:   Well developed, well nourished . NAD.  HEENT:  Normocephalic .  Face symmetric, atraumatic Lungs:  CTA B Normal respiratory effort, no intercostal retractions, no accessory muscle use. Heart: RRR,  no murmur.  no pretibial edema bilaterally  Abdomen:  Not distended, soft, non-tender. No rebound or rigidity.  Skin: Not pale. Not jaundice Neurologic:  alert & oriented X3.  Speech normal, gait appropriate for age and unassisted Psych--  Cognition and judgment appear intact.  Cooperative with normal attention span and concentration.  Behavior appropriate. No anxious or depressed appearing.    Assessment & Plan:   Assessment Anxiety depression Hypothyroidism Hyperlipidemia, mild TIA 04-2015 --->  Echo rx but not done brain MRI/MRA 05-2015  essentially neg Stress incontinence -- per urology, intol to meds  Osteoporosis MSK: --DJD --Spinal stenosis Severe leg cramps: Intolerant to Requip ,gabapentin (dizziness) , tonic water helps some Palpable Ao: [place] (-) AAA 2014 GB stones [place] 05-2015  PLAN: Hyperlipidemia: Reluctant to take Lipitor because it caused problems to her husband in the past, taking read yeast Rice, likes FLP checked. Will do, if cholesterol is still elevated  will recommend an alternative statin such as Pravachol. TIA: No for symptoms, echocardiogram , pending, will reschedule GB stones: Having right upper quadrant pain most nights,  surgical referral failed, rearrange. RTC 5 months

## 2015-09-06 NOTE — Progress Notes (Signed)
Pre visit review using our clinic review tool, if applicable. No additional management support is needed unless otherwise documented below in the visit note. 

## 2015-09-06 NOTE — Patient Instructions (Signed)
GO TO THE LAB :      Get the blood work     GO TO THE FRONT DESK Schedule your next appointment for a  physical When?   5 months from now Fasting?  Yes

## 2015-09-06 NOTE — Assessment & Plan Note (Signed)
Hyperlipidemia: Reluctant to take Lipitor because it caused problems to her husband in the past, taking read yeast Rice, likes FLP checked. Will do, if cholesterol is still elevated will recommend an alternative statin such as Pravachol. TIA: No for symptoms, echocardiogram , pending, will reschedule GB stones: Having right upper quadrant pain most nights,  surgical referral failed, rearrange. RTC 5 months

## 2015-09-08 MED ORDER — PRAVASTATIN SODIUM 40 MG PO TABS: 40.0000 mg | ORAL_TABLET | Freq: Every day | ORAL | Status: DC

## 2015-09-08 NOTE — Addendum Note (Signed)
Addended byDamita Dunnings D on: 09/08/2015 01:50 PM   Modules accepted: Orders

## 2015-09-22 ENCOUNTER — Other Ambulatory Visit: Payer: Self-pay

## 2015-09-22 ENCOUNTER — Ambulatory Visit (HOSPITAL_COMMUNITY): Payer: Medicare Other | Attending: Cardiovascular Disease

## 2015-09-22 DIAGNOSIS — E785 Hyperlipidemia, unspecified: Secondary | ICD-10-CM | POA: Insufficient documentation

## 2015-09-22 DIAGNOSIS — G459 Transient cerebral ischemic attack, unspecified: Secondary | ICD-10-CM | POA: Insufficient documentation

## 2015-09-22 DIAGNOSIS — I059 Rheumatic mitral valve disease, unspecified: Secondary | ICD-10-CM | POA: Insufficient documentation

## 2015-11-03 DIAGNOSIS — K8020 Calculus of gallbladder without cholecystitis without obstruction: Secondary | ICD-10-CM | POA: Diagnosis not present

## 2016-01-20 ENCOUNTER — Telehealth: Payer: Self-pay | Admitting: Internal Medicine

## 2016-01-20 NOTE — Telephone Encounter (Signed)
Not on pt's current med list.  Previously prescribed: 06/16/14 Dc'd: 11/13/14  Medication Notes     Colon Branch, MD -- 11/13/14 4:44 PM     no help      Please advise.

## 2016-01-20 NOTE — Telephone Encounter (Signed)
°  Relation to [id number] Call back number:[phone] Pharmacy: [facility] [address], [place] [address] [phone] ([phone] (Fax)     Reason for call:  Patient requesting a refill rOPINIRole (REQUIP) 0.5 MG tablet

## 2016-01-21 MED ORDER — ROPINIROLE HCL 0.5 MG PO TABS: 0.5000 mg | ORAL_TABLET | Freq: Every evening | ORAL | 0 refills | Status: DC | PRN

## 2016-01-21 NOTE — Telephone Encounter (Signed)
Rx sent 

## 2016-01-21 NOTE — Telephone Encounter (Signed)
Advise patient: Marie Haynes to try again Requip for leg  cramps: 0.5 mg one or 2 tablets at bedtime #60, no refills

## 2016-02-10 ENCOUNTER — Encounter: Payer: Medicare Other | Admitting: Internal Medicine

## 2016-02-15 DIAGNOSIS — M9902 Segmental and somatic dysfunction of thoracic region: Secondary | ICD-10-CM | POA: Diagnosis not present

## 2016-02-15 DIAGNOSIS — M545 Low back pain: Secondary | ICD-10-CM | POA: Diagnosis not present

## 2016-02-15 DIAGNOSIS — M546 Pain in thoracic spine: Secondary | ICD-10-CM | POA: Diagnosis not present

## 2016-02-15 DIAGNOSIS — M9901 Segmental and somatic dysfunction of cervical region: Secondary | ICD-10-CM | POA: Diagnosis not present

## 2016-02-15 DIAGNOSIS — M542 Cervicalgia: Secondary | ICD-10-CM | POA: Diagnosis not present

## 2016-02-15 DIAGNOSIS — M9903 Segmental and somatic dysfunction of lumbar region: Secondary | ICD-10-CM | POA: Diagnosis not present

## 2016-02-17 DIAGNOSIS — M542 Cervicalgia: Secondary | ICD-10-CM | POA: Diagnosis not present

## 2016-02-17 DIAGNOSIS — M9902 Segmental and somatic dysfunction of thoracic region: Secondary | ICD-10-CM | POA: Diagnosis not present

## 2016-02-17 DIAGNOSIS — M545 Low back pain: Secondary | ICD-10-CM | POA: Diagnosis not present

## 2016-02-17 DIAGNOSIS — M546 Pain in thoracic spine: Secondary | ICD-10-CM | POA: Diagnosis not present

## 2016-02-17 DIAGNOSIS — M9901 Segmental and somatic dysfunction of cervical region: Secondary | ICD-10-CM | POA: Diagnosis not present

## 2016-02-17 DIAGNOSIS — M9903 Segmental and somatic dysfunction of lumbar region: Secondary | ICD-10-CM | POA: Diagnosis not present

## 2016-02-23 ENCOUNTER — Ambulatory Visit (INDEPENDENT_AMBULATORY_CARE_PROVIDER_SITE_OTHER): Payer: Medicare Other | Admitting: Internal Medicine

## 2016-02-23 ENCOUNTER — Encounter: Payer: Self-pay | Admitting: Internal Medicine

## 2016-02-23 VITALS — BP 122/76 | HR 68 | Temp 98.1°F | Resp 14 | Ht 63.0 in | Wt 128.4 lb

## 2016-02-23 DIAGNOSIS — E785 Hyperlipidemia, unspecified: Secondary | ICD-10-CM | POA: Diagnosis not present

## 2016-02-23 DIAGNOSIS — Z0000 Encounter for general adult medical examination without abnormal findings: Secondary | ICD-10-CM

## 2016-02-23 DIAGNOSIS — M810 Age-related osteoporosis without current pathological fracture: Secondary | ICD-10-CM

## 2016-02-23 DIAGNOSIS — F329 Major depressive disorder, single episode, unspecified: Secondary | ICD-10-CM

## 2016-02-23 DIAGNOSIS — F418 Other specified anxiety disorders: Secondary | ICD-10-CM | POA: Diagnosis not present

## 2016-02-23 DIAGNOSIS — E039 Hypothyroidism, unspecified: Secondary | ICD-10-CM

## 2016-02-23 DIAGNOSIS — F419 Anxiety disorder, unspecified: Secondary | ICD-10-CM

## 2016-02-23 LAB — LIPID PANEL
CHOL/HDL RATIO: 2
Cholesterol: 231 mg/dL — ABNORMAL HIGH (ref 0–200)
HDL: 95 mg/dL (ref >39.00)
LDL CALC: 124 mg/dL — AB (ref 0–99)
NONHDL: 136.15
TRIGLYCERIDES: 59 mg/dL (ref 0.0–149.0)
VLDL: 11.8 mg/dL (ref 0.0–40.0)

## 2016-02-23 LAB — BASIC METABOLIC PANEL
BUN: 14 mg/dL (ref 6–23)
CHLORIDE: 104 meq/L (ref 96–112)
CO2: 30 meq/L (ref 19–32)
CREATININE: 0.68 mg/dL (ref 0.40–1.20)
Calcium: 9.2 mg/dL (ref 8.4–10.5)
GFR: 87.93 mL/min (ref >60.00)
Glucose, Bld: 82 mg/dL (ref 70–99)
Potassium: 4.2 mEq/L (ref 3.5–5.1)
Sodium: 141 mEq/L (ref 135–145)

## 2016-02-23 LAB — TSH: TSH: 0.31 u[IU]/mL — ABNORMAL LOW (ref 0.35–4.50)

## 2016-02-23 MED ORDER — PRAVASTATIN SODIUM 40 MG PO TABS: 40.0000 mg | ORAL_TABLET | Freq: Every day | ORAL | 3 refills | Status: DC

## 2016-02-23 NOTE — Progress Notes (Signed)
Subjective:    Patient ID: Marie Haynes, female    DOB: 1931-09-21, 82 y.o.   MRN: [id number]  DOS:  02/23/2016 Type of visit - description :  Here for Medicare AWV:   1. Risk factors based on Past M, S, F history: reviewed 2. Physical Activities:  still active, exercises x 2/week, walks 3. Depression/mood:   +stress, daughter is bipolar, occ depressed mood , declined help-meds, no suicidal 4. Hearing:  No problems noted or reported  5. ADL's: independent, drives  6. Fall Risk: no recent falls, prevention discussed , see AVS 7. home Safety: does feel safe at home  8. Height, weight, & visual acuity: see VS, s/p cataract surgery , doing well; due to see eye doctor, encouraged to go, declined referral 9. Counseling: provided 10. Labs ordered based on risk factors: if needed  11. Referral Coordination: if needed 12. Care Plan, see assessment and plan , written personalized plan provided , see AVS 13. Cognitive Assessment: motor skills and cognition appropriate for age 19. Care team updated 15. End-of-life care discussed, rec to get a HC POA ("I forgot to get it") , paperwork provided    In addition, today we discussed the following: Hypothyroidism: Good compliance with medications Osteoporosis: On calcium and vitamin D High cholesterol: Unable to afford to statins and not enthusiastic about taking them.   Review of Systems Constitutional: No fever. No chills. No unexplained wt changes. No unusual sweats  HEENT: No dental problems, no ear discharge, no facial swelling, no voice changes. No eye discharge, no eye  redness , no  intolerance to light   Respiratory: No wheezing , no  difficulty breathing. No cough , no mucus production  Cardiovascular: No CP, no leg swelling , no  Palpitations  GI: no nausea, no vomiting, no diarrhea , no  abdominal pain.  No blood in the stools. No dysphagia, no odynophagia    Endocrine: No polyphagia, no polyuria , no polydipsia  GU: No  dysuria, gross hematuria, difficulty urinating. No urinary urgency, no frequency.  Musculoskeletal: No joint swellings or unusual aches or pains  Skin: No change in the color of the skin, palor , no  Rash  Allergic, immunologic: No environmental allergies , no  food allergies  Neurological: No dizziness no  syncope. No headaches. No diplopia, no slurred, no slurred speech, no motor deficits, no facial  Numbness  Hematological: No enlarged lymph nodes, no easy bruising , no unusual bleedings  Psychiatry: No suicidal ideas, no hallucinations, no beavior problems, no confusion.    Past Medical History:  Diagnosis Date  . Anxiety and depression   . DJD (degenerative joint disease)   . Hypothyroidism   . Increased urinary frequency    Dr. Matilde Sprang  . LEG CRAMPS, NOCTURNAL 02/22/2007   Qualifier: Diagnosis of  By: Larose Kells MD, [place] Osteoporosis   . Spinal stenosis   . Stress incontinence    (saw Urology 5-09)    . Symptomatic cholelithiasis 2017   reluctant to have surgery  . Urge and stress incontinence    Dr. Matilde Sprang    Past Surgical History:  Procedure Laterality Date  . BACK SURGERY  [phone]  . DILATION AND CURETTAGE OF UTERUS      Social History   Social History  . Marital status: Married    Spouse name: N/A  . Number of children: 3  . Years of education: N/A   Occupational History  . retired Psychologist, prison and probation services  Retired   Social History Main Topics  . Smoking status: Never Smoker  . Smokeless tobacco: Never Used  . Alcohol use Yes     Comment: Rare  . Drug use: No  . Sexual activity: Not on file   Other Topics Concern  . Not on file   Social History Narrative   Daughter bipolar     Family History  Problem Relation Age of Onset  . Heart failure Mother   . Heart attack Mother 12    dx in her 5s  . Colon cancer Neg Hx   . Breast cancer Neg Hx   . Diabetes Neg Hx        Medication List       Accurate as of 02/23/16 11:40 AM. Always use  your most recent med list.          CALCIUM + D PO Take 3 tablets by mouth 2 (two) times daily.   cholecalciferol 400 units Tabs tablet Commonly known as:  VITAMIN D Take 4,000 Units by mouth daily.   fish oil-omega-3 fatty acids 1000 MG capsule Take 1 g by mouth 2 (two) times daily.   GLUCOSAMINE 1500 COMPLEX PO Take 2 tablets by mouth 2 (two) times daily.   levothyroxine 112 MCG tablet Commonly known as:  SYNTHROID, LEVOTHROID Take 1 tablet (112 mcg total) by mouth daily before breakfast.   pravastatin 40 MG tablet Commonly known as:  PRAVACHOL Take 1 tablet (40 mg total) by mouth at bedtime.   RED YEAST RICE PO Take 1 tablet by mouth daily.   rOPINIRole 0.5 MG tablet Commonly known as:  REQUIP Take 1-2 tablets (0.5-1 mg total) by mouth at bedtime as needed (Leg cramps/RLS).   vitamin C 500 MG tablet Commonly known as:  ASCORBIC ACID Take 500 mg by mouth 2 (two) times daily.   vitamin E 400 UNIT capsule Take 400 Units by mouth 3 (three) times daily.   vitamin k 100 MCG tablet Take 100 mcg by mouth daily.          Objective:   Physical Exam BP 122/76 (BP Location: Left Arm, Patient Position: Sitting, Cuff Size: Small)   Pulse 68   Temp 98.1 F (36.7 C) (Oral)   Resp 14   Ht 5\' 3"  (1.6 m)   Wt 128 lb 6 oz (58.2 kg)   SpO2 96%   BMI 22.74 kg/m   General:   Well developed, well nourished . NAD.  Neck: No  thyromegaly  HEENT:  Normocephalic . Face symmetric, atraumatic Lungs:  CTA B Normal respiratory effort, no intercostal retractions, no accessory muscle use. Heart: RRR,  no murmur.  No pretibial edema bilaterally  Abdomen:  Not distended, soft, non-tender. No rebound or rigidity.  Palpable aorta without bruit Skin: Exposed areas without rash. Not pale. Not jaundice Neurologic:  alert & oriented X3.  Speech normal, gait appropriate for age and unassisted Strength symmetric and appropriate for age.  Psych: Cognition and judgment appear  intact.  Cooperative with normal attention span and concentration.  Behavior appropriate. No anxious or depressed appearing.    Assessment & Plan:   Assessment Anxiety depression Hypothyroidism Hyperlipidemia, mild TIA 04-2015 --->  Echo 09-2015 negative brain MRI/MRA 05-2015  essentially neg Stress incontinence -- per urology, intol to meds  Osteoporosis: Took Fosamax x 2 years , self DC'd 2014 due to potential for s/e.  T score 2013-2.7. Declined to take prolia 06-2014 MSK: --DJD --Spinal stenosis Severe leg cramps:  gabapentin (  dizziness) , tonic water helps some, requip prn Palpable Ao: [place] (-) AAA 2014 GB stones [place] 05-2015  PLAN: Anxiety depression: Mild depression from time to time, declined   treatment Hypothyroidism: On Synthroid, check a TSH Hyperlipidemia: Reports Pravachol was very expensive, recommend to check with his pharmacy again as it is a generic. Let me know if she decides to take statins. Will need blood work. Check a BMP and a FLP per pt request  H/o TIA: rec aspirin qd , we are trying to control CV RFs Osteoporosis: On calcium and vitamin D. Risk of fractures discuss, to let me know if she decides to get treatment. RTC one year

## 2016-02-23 NOTE — Assessment & Plan Note (Signed)
Anxiety depression: Mild depression from time to time, declined   treatment Hypothyroidism: On Synthroid, check a TSH Hyperlipidemia: Reports Pravachol was very expensive, recommend to check with his pharmacy again as it is a generic. Let me know if she decides to take statins. Will need blood work. Check a BMP and a FLP per pt request  H/o TIA: rec aspirin qd , we are trying to control CV RFs Osteoporosis: On calcium and vitamin D. Risk of fractures discuss, to let me know if she decides to get treatment. RTC one year

## 2016-02-23 NOTE — Progress Notes (Signed)
Pre visit review using our clinic review tool, if applicable. No additional management support is needed unless otherwise documented below in the visit note. 

## 2016-02-23 NOTE — Patient Instructions (Signed)
GO TO THE LAB : Get the blood work    GO TO THE FRONT DESK Schedule your next appointment for a   checkup in one year  If you decide to take Pravachol, please let us know. You will need blood work 6 weeks after you start  Recommend an aspirin 81 mg every day       Fall Prevention and Home Safety Falls cause injuries and can affect all age groups. It is possible to use preventive measures to significantly decrease the likelihood of falls. There are many simple measures which can make your home safer and prevent falls. OUTDOORS  Repair cracks and edges of walkways and driveways.  Remove high doorway thresholds.  Trim shrubbery on the main path into your home.  Have good outside lighting.  Clear walkways of tools, rocks, debris, and clutter.  Check that handrails are not broken and are securely fastened. Both sides of steps should have handrails.  Have leaves, snow, and ice cleared regularly.  Use sand or salt on walkways during winter months.  In the garage, clean up grease or oil spills. BATHROOM  Install night lights.  Install grab bars by the toilet and in the tub and shower.  Use non-skid mats or decals in the tub or shower.  Place a plastic non-slip stool in the shower to sit on, if needed.  Keep floors dry and clean up all water on the floor immediately.  Remove soap buildup in the tub or shower on a regular basis.  Secure bath mats with non-slip, double-sided rug tape.  Remove throw rugs and tripping hazards from the floors. BEDROOMS  Install night lights.  Make sure a bedside light is easy to reach.  Do not use oversized bedding.  Keep a telephone by your bedside.  Have a firm chair with side arms to use for getting dressed.  Remove throw rugs and tripping hazards from the floor. KITCHEN  Keep handles on pots and pans turned toward the center of the stove. Use back burners when possible.  Clean up spills quickly and allow time for  drying.  Avoid walking on wet floors.  Avoid hot utensils and knives.  Position shelves so they are not too high or low.  Place commonly used objects within easy reach.  If necessary, use a sturdy step stool with a grab bar when reaching.  Keep electrical cables out of the way.  Do not use floor polish or wax that makes floors slippery. If you must use wax, use non-skid floor wax.  Remove throw rugs and tripping hazards from the floor. STAIRWAYS  Never leave objects on stairs.  Place handrails on both sides of stairways and use them. Fix any loose handrails. Make sure handrails on both sides of the stairways are as long as the stairs.  Check carpeting to make sure it is firmly attached along stairs. Make repairs to worn or loose carpet promptly.  Avoid placing throw rugs at the top or bottom of stairways, or properly secure the rug with carpet tape to prevent slippage. Get rid of throw rugs, if possible.  Have an electrician put in a light switch at the top and bottom of the stairs. OTHER FALL PREVENTION TIPS  Wear low-heel or rubber-soled shoes that are supportive and fit well. Wear closed toe shoes.  When using a stepladder, make sure it is fully opened and both spreaders are firmly locked. Do not climb a closed stepladder.  Add color or contrast paint or tape  to grab bars and handrails in your home. Place contrasting color strips on first and last steps.  Learn and use mobility aids as needed. Install an electrical emergency response system.  Turn on lights to avoid dark areas. Replace light bulbs that burn out immediately. Get light switches that glow.  Arrange furniture to create clear pathways. Keep furniture in the same place.  Firmly attach carpet with non-skid or double-sided tape.  Eliminate uneven floor surfaces.  Select a carpet pattern that does not visually hide the edge of steps.  Be aware of all pets. OTHER HOME SAFETY TIPS  Set the water temperature  for 120 F (48.8 C).  Keep emergency numbers on or near the telephone.  Keep smoke detectors on every level of the home and near sleeping areas. Document Released: 05/12/2002 Document Revised: 11/21/2011 Document Reviewed: 08/11/2011 [facility] Patient Information 2015 [place], [place]. This information is not intended to replace advice given to you by your health care provider. Make sure you discuss any questions you have with your health care provider.   Preventive Care for Adults Ages 85 and over  Blood pressure check.** / Every 1 to 2 years.  Lipid and cholesterol check.**/ Every 5 years beginning at age 55.  Lung cancer screening. / Every year if you are aged 74-80 years and have a 30-pack-year history of smoking and currently smoke or have quit within the past 15 years. Yearly screening is stopped once you have quit smoking for at least 15 years or develop a health problem that would prevent you from having lung cancer treatment.  Fecal occult blood test (FOBT) of stool. / Every year beginning at age 56 and continuing until age 51. You may not have to do this test if you get a colonoscopy every 10 years.  Flexible sigmoidoscopy** or colonoscopy.** / Every 5 years for a flexible sigmoidoscopy or every 10 years for a colonoscopy beginning at age 71 and continuing until age 1.  Hepatitis C blood test.** / For all people born from [birth year] through 1963 and any individual with known risks for hepatitis C.  Abdominal aortic aneurysm (AAA) screening.** / A one-time screening for ages 64 to 91 years who are current or former smokers.  Skin self-exam. / Monthly.  Influenza vaccine. / Every year.  Tetanus, diphtheria, and acellular pertussis (Tdap/Td) vaccine.** / 1 dose of Td every 10 years.  Varicella vaccine.** / Consult your health care provider.  Zoster vaccine.** / 1 dose for adults aged 31 years or older.  Pneumococcal 13-valent conjugate (PCV13) vaccine.** / Consult your health  care provider.  Pneumococcal polysaccharide (PPSV23) vaccine.** / 1 dose for all adults aged 85 years and older.  Meningococcal vaccine.** / Consult your health care provider.  Hepatitis A vaccine.** / Consult your health care provider.  Hepatitis B vaccine.** / Consult your health care provider.  Haemophilus influenzae type b (Hib) vaccine.** / Consult your health care provider. **Family history and personal history of risk and conditions may change your health care provider's recommendations. Document Released: 07/18/2001 Document Revised: 05/27/2013 Document Reviewed: 10/17/2010 [facility] Patient Information 2015 [place], [place]. This information is not intended to replace advice given to you by your health care provider. Make sure you discuss any questions you have with your health care provider.

## 2016-02-23 NOTE — Assessment & Plan Note (Addendum)
Td 2012 ;pneumonia shot 1998 and 2011; prevnar 2015 zostavax-- Rx provided before, never used; declined a flu shot, benefits discussed   Last Colonoscopy:  06/05/1998--actual report not available  Colon bx -- hyperrplastic polyp   Dr Collene Mares [phone], states she won't do another one   Female care: saw Dr Raphael Gibney 2016, was rec no further PAPs MMG--neg  11/2012 --- declined referral  , per guidelines no further MMG needed Diet , exercise discussed

## 2016-02-28 ENCOUNTER — Other Ambulatory Visit: Payer: Self-pay | Admitting: Internal Medicine

## 2016-02-28 DIAGNOSIS — E039 Hypothyroidism, unspecified: Secondary | ICD-10-CM

## 2016-04-15 ENCOUNTER — Other Ambulatory Visit: Payer: Self-pay | Admitting: Internal Medicine

## 2016-04-15 DIAGNOSIS — E039 Hypothyroidism, unspecified: Secondary | ICD-10-CM

## 2016-05-10 ENCOUNTER — Emergency Department (HOSPITAL_COMMUNITY): Payer: Medicare Other

## 2016-05-10 ENCOUNTER — Emergency Department (HOSPITAL_COMMUNITY)
Admission: EM | Admit: 2016-05-10 | Discharge: 2016-05-10 | Disposition: A | Discharge status: Home / Self Care | Payer: Medicare Other | Attending: Emergency Medicine | Admitting: Emergency Medicine

## 2016-05-10 ENCOUNTER — Encounter (HOSPITAL_COMMUNITY): Payer: Self-pay

## 2016-05-10 DIAGNOSIS — E039 Hypothyroidism, unspecified: Secondary | ICD-10-CM | POA: Insufficient documentation

## 2016-05-10 DIAGNOSIS — M5020 Other cervical disc displacement, unspecified cervical region: Secondary | ICD-10-CM | POA: Diagnosis not present

## 2016-05-10 DIAGNOSIS — Z7982 Long term (current) use of aspirin: Secondary | ICD-10-CM | POA: Diagnosis not present

## 2016-05-10 DIAGNOSIS — M542 Cervicalgia: Secondary | ICD-10-CM

## 2016-05-10 LAB — BASIC METABOLIC PANEL
Anion gap: 7 (ref 5–15)
BUN: 11 mg/dL (ref 6–20)
CHLORIDE: 104 mmol/L (ref 101–111)
CO2: 27 mmol/L (ref 22–32)
Calcium: 9.1 mg/dL (ref 8.9–10.3)
Creatinine, Ser: 0.65 mg/dL (ref 0.44–1.00)
GFR calc Af Amer: >60 mL/min (ref >60)
GFR calc non Af Amer: >60 mL/min (ref >60)
GLUCOSE: 95 mg/dL (ref 65–99)
POTASSIUM: 4 mmol/L (ref 3.5–5.1)
SODIUM: 138 mmol/L (ref 135–145)

## 2016-05-10 LAB — CBC WITH DIFFERENTIAL/PLATELET
Basophils Absolute: 0 10*3/uL (ref 0.0–0.1)
Basophils Relative: 1 %
Eosinophils Absolute: 0.2 10*3/uL (ref 0.0–0.7)
Eosinophils Relative: 2 %
HEMATOCRIT: 35.8 % — AB (ref 36.0–46.0)
HEMOGLOBIN: 11.6 g/dL — AB (ref 12.0–15.0)
LYMPHS ABS: 1.1 10*3/uL (ref 0.7–4.0)
LYMPHS PCT: 18 %
MCH: 29.9 pg (ref 26.0–34.0)
MCHC: 32.4 g/dL (ref 30.0–36.0)
MCV: 92.3 fL (ref 78.0–100.0)
Monocytes Absolute: 0.9 10*3/uL (ref 0.1–1.0)
Monocytes Relative: 14 %
NEUTROS ABS: 4.2 10*3/uL (ref 1.7–7.7)
NEUTROS PCT: 65 %
Platelets: 212 10*3/uL (ref 150–400)
RBC: 3.88 MIL/uL (ref 3.87–5.11)
RDW: 13.8 % (ref 11.5–15.5)
WBC: 6.4 10*3/uL (ref 4.0–10.5)

## 2016-05-10 MED ORDER — DEXAMETHASONE SODIUM PHOSPHATE 10 MG/ML IJ SOLN
10.0000 mg | Freq: Once | INTRAMUSCULAR | Status: DC
  Filled 2016-05-10: qty 1

## 2016-05-10 MED ORDER — ONDANSETRON HCL 4 MG/2ML IJ SOLN
4.0000 mg | Freq: Once | INTRAMUSCULAR | Status: AC
  Administered 2016-05-10: 4 mg via INTRAVENOUS
  Filled 2016-05-10: qty 2

## 2016-05-10 MED ORDER — DEXAMETHASONE SODIUM PHOSPHATE 10 MG/ML IJ SOLN
10.0000 mg | Freq: Once | INTRAMUSCULAR | Status: AC
  Administered 2016-05-10: 10 mg via INTRAVENOUS

## 2016-05-10 MED ORDER — TRAMADOL HCL 50 MG PO TABS: 50.0000 mg | ORAL_TABLET | Freq: Four times a day (QID) | ORAL | 0 refills | Status: DC | PRN

## 2016-05-10 MED ORDER — DIAZEPAM 2 MG PO TABS: 2.0000 mg | ORAL_TABLET | Freq: Two times a day (BID) | ORAL | 0 refills | Status: DC

## 2016-05-10 MED ORDER — MORPHINE SULFATE (PF) 4 MG/ML IV SOLN
4.0000 mg | Freq: Once | INTRAVENOUS | Status: AC
  Administered 2016-05-10: 4 mg via INTRAVENOUS
  Filled 2016-05-10: qty 1

## 2016-05-10 MED ORDER — DIAZEPAM 2 MG PO TABS
2.0000 mg | ORAL_TABLET | Freq: Once | ORAL | Status: AC
  Administered 2016-05-10: 2 mg via ORAL
  Filled 2016-05-10: qty 1

## 2016-05-10 MED ORDER — KETOROLAC TROMETHAMINE 30 MG/ML IJ SOLN
30.0000 mg | Freq: Once | INTRAMUSCULAR | Status: AC
  Administered 2016-05-10: 30 mg via INTRAVENOUS
  Filled 2016-05-10: qty 1

## 2016-05-10 NOTE — ED Notes (Signed)
ED Provider at bedside. 

## 2016-05-10 NOTE — ED Provider Notes (Signed)
[facility] DEPT Provider Note   CSN: [id number] Arrival date & time: 05/10/16  [id number]     History   Chief Complaint Chief Complaint  Patient presents with  . Neck Pain    HPI Marie Haynes is a 81 y.o. female.  The history is provided by the patient and medical records.  Neck Pain     82 y.o. F with hx of anxiety, depression, DJD, hypothyroidism, osteoporosis, spinal stenosis, urge incontinence, presenting to the ED for atraumatic neck pain.  Patient reports pain started on Sunday on the left side her neck but has now transitioned throughout her entire neck.  States it is a deep pain, throbbing in nature, much worse with trying to turn her head side to side, not as bad when bending forward.  States pain radiates into her back a little.  She denies any associated headache, fever, chills, numbness, or weakness of her arms or legs.  No chest pain, no SOB.  No difficulty swallowing or sore throat. States she has no history of prior neck injuries or surgeries. She has never had any type of injections. States she did take some Aleve yesterday evening which only mildly improved her pain.  States the only relief she can get is holding her neck up with her hands. States she has a soft collar at home, however that did not help.  States she did try to call her neurosurgeon, Dr. Carloyn Manner, however she did not get a response from him so she decided to come in.  Past Medical History:  Diagnosis Date  . Anxiety and depression   . DJD (degenerative joint disease)   . Hypothyroidism   . Increased urinary frequency    Dr. Matilde Sprang  . LEG CRAMPS, NOCTURNAL 02/22/2007   Qualifier: Diagnosis of  By: Larose Kells MD, [facility]   . Spinal stenosis   . Stress incontinence    (saw Urology 5-09)    . Symptomatic cholelithiasis 2017   reluctant to have surgery  . Urge and stress incontinence    Dr. Matilde Sprang    Patient Active Problem List   Diagnosis Date Noted  . Follow-up -------------PCP  NOTES 02/10/2015  . Annual physical exam 09/04/2012  . Hyperlipidemia, mild 09/03/2012  . Anxiety and depression 06/23/2011  . SKIN LESION 03/25/2010  . OSTEOARTHRITIS 12/12/2007  . Hypothyroidism 02/22/2007  . SPINAL STENOSIS 02/22/2007  . LEG CRAMPS, NOCTURNAL 02/22/2007  . Osteoporosis 02/22/2007    Past Surgical History:  Procedure Laterality Date  . BACK SURGERY  [phone]  . DILATION AND CURETTAGE OF UTERUS      OB History    No data available       Home Medications    Prior to Admission medications   Medication Sig Start Date End Date Taking? Authorizing Provider  aspirin EC 81 MG tablet Take 81 mg by mouth daily.    Historical Provider, MD  Calcium Carbonate-Vitamin D (CALCIUM + D PO) Take 3 tablets by mouth 2 (two) times daily.     Historical Provider, MD  cholecalciferol (VITAMIN D) 400 UNITS TABS Take 4,000 Units by mouth daily.     Historical Provider, MD  fish oil-omega-3 fatty acids 1000 MG capsule Take 1 g by mouth 2 (two) times daily.      Historical Provider, MD  Glucosamine-Chondroit-Vit C-Mn (GLUCOSAMINE 1500 COMPLEX PO) Take 2 tablets by mouth 2 (two) times daily.      Historical Provider, MD  levothyroxine (SYNTHROID, LEVOTHROID) 112 MCG tablet  Take 1 tablet (112 mcg total) by mouth daily before breakfast. 04/17/16   Colon Branch, MD  pravastatin (PRAVACHOL) 40 MG tablet Take 1 tablet (40 mg total) by mouth at bedtime. 02/23/16   Colon Branch, MD  Red Yeast Rice Extract (RED YEAST RICE PO) Take 1 tablet by mouth daily.    Historical Provider, MD  rOPINIRole (REQUIP) 0.5 MG tablet Take 1-2 tablets (0.5-1 mg total) by mouth at bedtime as needed (Leg cramps/RLS). 01/21/16   Colon Branch, MD  vitamin C (ASCORBIC ACID) 500 MG tablet Take 500 mg by mouth 2 (two) times daily.     Historical Provider, MD  vitamin E 400 UNIT capsule Take 400 Units by mouth 3 (three) times daily.     Historical Provider, MD  vitamin k 100 MCG tablet Take 100 mcg by mouth daily.      Historical  Provider, MD    Family History Family History  Problem Relation Age of Onset  . Heart failure Mother   . Heart attack Mother 68    dx in her 19s  . Colon cancer Neg Hx   . Breast cancer Neg Hx   . Diabetes Neg Hx     Social History Social History  Substance Use Topics  . Smoking status: Never Smoker  . Smokeless tobacco: Never Used  . Alcohol use Yes     Comment: Rare     Allergies   Oxycodone-acetaminophen and Codeine   Review of Systems Review of Systems  Musculoskeletal: Positive for neck pain.  All other systems reviewed and are negative.    Physical Exam Updated Vital Signs BP 138/75 (BP Location: Right Arm)   Pulse 78   Temp 97.8 F (36.6 C) (Oral)   Resp 20   Ht 5\' 3"  (1.6 m)   Wt 58.1 kg   SpO2 98%   BMI 22.67 kg/m   Physical Exam  Constitutional: She is oriented to person, place, and time. She appears well-developed and well-nourished.  HENT:  Head: Normocephalic and atraumatic.  Mouth/Throat: Oropharynx is clear and moist.  Eyes: Conjunctivae and EOM are normal. Pupils are equal, round, and reactive to light.  Neck:  Sitting with hands holding her up neck for comfort; she has decreased ROM due to level of pain; no apparent rigidity; normal strength/sensation of both arms  Cardiovascular: Normal rate, regular rhythm and normal heart sounds.   Pulmonary/Chest: Effort normal and breath sounds normal.  Abdominal: Soft. Bowel sounds are normal.  Musculoskeletal: Normal range of motion.  Neurological: She is alert and oriented to person, place, and time.  AAOx3, answering questions and following commands appropriately; equal strength UE and LE bilaterally; CN grossly intact; moves all extremities appropriately without ataxia; no focal neuro deficits or facial asymmetry appreciated  Skin: Skin is warm and dry.  Psychiatric: She has a normal mood and affect.  Nursing note and vitals reviewed.    ED Treatments / Results  Labs (all labs ordered  are listed, but only abnormal results are displayed) Labs Reviewed  CBC WITH DIFFERENTIAL/PLATELET - Abnormal; Notable for the following:       Result Value   Hemoglobin 11.6 (*)    HCT 35.8 (*)    All other components within normal limits  BASIC METABOLIC PANEL    EKG  EKG Interpretation  Date/Time:  Wednesday May 10 2016 06:50:49 EST Ventricular Rate:  77 PR Interval:    QRS Duration: 128 QT Interval:  440 QTC Calculation: 498 R  Axis:   -81 Text Interpretation:  Sinus rhythm Left bundle branch block No old tracing to compare Confirmed by [facility]  MD, DAVID ([id number]) on 05/10/2016 6:53:51 AM       Radiology Ct Cervical Spine Wo Contrast  Result Date: 05/10/2016 CLINICAL DATA:  Neck pain starting Saturday EXAM: CT CERVICAL SPINE WITHOUT CONTRAST TECHNIQUE: Multidetector CT imaging of the cervical spine was performed without intravenous contrast. Multiplanar CT image reconstructions were also generated. COMPARISON:  None. FINDINGS: Alignment: There is normal alignment. Skull base and vertebrae: No acute fracture or subluxation. Degenerative changes are noted facet articulation. Soft tissues and spinal canal: There is posterior spurring with mild spinal canal stenosis at C3-C4 level. Posterior spurring and mild disc bulge at C5-C6 level at C6-C7 level. No prevertebral soft tissue swelling. Disc levels:  Disc space flattening at C4-C5, C5-C6 and C6-C7 level. Upper chest: No pneumothorax is noted in visualized lung apices. Other: Degenerative changes with pannus formation surrounding C2 odontoid. IMPRESSION: 1. No acute fracture or subluxation. Degenerative changes with posterior spurring mild disc bulge and mild spinal canal stenosis at C3-C4 level. Degenerative changes at C4-C5, C5-C6 and C6-C7 level. Mild spinal canal stenosis due to posterior spurring at C5-C6 and C6-C7 level. 2. Degenerative changes C1-C2 articulation. No prevertebral soft tissue swelling. 3. Cervical airway is patent.  Electronically Signed   By: Lahoma Crocker M.D.   On: 05/10/2016 08:25    Procedures Procedures (including critical care time)  Medications Ordered in ED Medications  morphine 4 MG/ML injection 4 mg (4 mg Intravenous Given 05/10/16 0716)  ondansetron (ZOFRAN) injection 4 mg (4 mg Intravenous Given 05/10/16 0716)  ketorolac (TORADOL) 30 MG/ML injection 30 mg (30 mg Intravenous Given 05/10/16 0943)  diazepam (VALIUM) tablet 2 mg (2 mg Oral Given 05/10/16 0942)  dexamethasone (DECADRON) injection 10 mg (10 mg Intravenous Given 05/10/16 0943)     Initial Impression / Assessment and Plan / ED Course  I have reviewed the triage vital signs and the nursing notes.  Pertinent labs & imaging results that were available during my care of the patient were reviewed by me and considered in my medical decision making (see chart for details).  Clinical Course   82 year old female here with atraumatic neck pain for the past several days. She is afebrile and nontoxic here. On exam she is holding her head up with her hands as she reports this is more comfortable for her. She has decreased range of motion due to pain. She has normal strength and sensation of her arms and legs. No apparent confusion. No difficulty swallowing.  She does have history of spinal stenosis. Will obtain CT scan of her neck for further evaluation as well as obtain basic labs. Patient was treated with morphine.  CT with endplate spurring as well as disc herniation at C3-C4 level which is likely causing her pain.  Labs are reassuring.  Not much improvement with morphine, additional meds ordered.  Results discussed with patient and husband at bedside, they voiced understanding.  11:02 AM After treatment with toradol, valium and decadron patient's pain is much improved.  She is moving her neck easily at this time.  Remains neurologically intact.  Vitals stable.  No dizziness or drowsiness from medications.  Patient feels well enough to go home at  this time.  Her current neurosurgeon is in [place], states she would prefer to see someone local as it is much closer for her so will refer to [place] [facility] clinic.  Will treat with  tramadol and valium at home as well as scheduled tylenol/motrin (patient has allergies to codeine and cannot tolerate this).  Return precautions were given for any new or worsening symptoms including numbness or weakness of her extremities, severe headache, fever, confusion, etc. Patient and family acknowledged understanding and agreed with plan of care.  Case discussed with attending physician, Dr. Rex Kras, who evaluated patient and agrees with assessment and plan of care.  Final Clinical Impressions(s) / ED Diagnoses   Final diagnoses:  Neck pain  Herniated disc, cervical    New Prescriptions New Prescriptions   DIAZEPAM (VALIUM) 2 MG TABLET    Take 1 tablet (2 mg total) by mouth 2 (two) times daily.   TRAMADOL (ULTRAM) 50 MG TABLET    Take 1 tablet (50 mg total) by mouth every 6 (six) hours as needed.     Larene Pickett, PA-C 05/10/16 [place], MD 05/10/16 1600

## 2016-05-10 NOTE — ED Triage Notes (Signed)
Pt c/o neck stiffness and pain that started on Sunday and has gotten worse over the past few days and radiates down her back. Pt has been taking Aleve for the pain with no relief.

## 2016-05-10 NOTE — Discharge Instructions (Signed)
Take the prescribed medication as directed.  Can take scheduled tylenol/motrin with this to help with pain. Follow-up with [place] neurosurgery-- call office to make appt. Return to the ED for new or worsening symptoms.

## 2016-05-13 ENCOUNTER — Telehealth: Payer: Self-pay | Admitting: Internal Medicine

## 2016-05-13 NOTE — Telephone Encounter (Signed)
Advise patient, due for a TSH. If she decided to take Pravachol, will need AST, ALT, FLP. Please arrange

## 2016-05-15 NOTE — Telephone Encounter (Signed)
Letter printed and mailed to Pt.  

## 2016-05-16 ENCOUNTER — Telehealth: Payer: Self-pay | Admitting: Internal Medicine

## 2016-05-16 NOTE — Telephone Encounter (Signed)
CT faxed.

## 2016-05-16 NOTE — Telephone Encounter (Signed)
Patient is requesting to have CT results from 05/10/16 sent to her neurosurgeon. CT was done is the Ed. Please advise.   Neurosurgeon- Dr. Glenna Fellows in [place]  Fax: [phone]  Patient phone: [phone]

## 2016-05-17 DIAGNOSIS — M47814 Spondylosis without myelopathy or radiculopathy, thoracic region: Secondary | ICD-10-CM | POA: Diagnosis not present

## 2016-05-17 DIAGNOSIS — M47812 Spondylosis without myelopathy or radiculopathy, cervical region: Secondary | ICD-10-CM | POA: Diagnosis not present

## 2016-05-17 DIAGNOSIS — M4694 Unspecified inflammatory spondylopathy, thoracic region: Secondary | ICD-10-CM | POA: Diagnosis not present

## 2016-06-05 ENCOUNTER — Other Ambulatory Visit: Payer: Self-pay | Admitting: Internal Medicine

## 2016-06-05 DIAGNOSIS — E039 Hypothyroidism, unspecified: Secondary | ICD-10-CM

## 2016-07-05 ENCOUNTER — Other Ambulatory Visit: Payer: Medicare Other

## 2016-07-05 ENCOUNTER — Other Ambulatory Visit: Payer: Self-pay

## 2016-07-05 ENCOUNTER — Other Ambulatory Visit: Payer: Self-pay | Admitting: Internal Medicine

## 2016-07-05 DIAGNOSIS — E039 Hypothyroidism, unspecified: Secondary | ICD-10-CM

## 2016-07-05 NOTE — Telephone Encounter (Signed)
Informed Pt has not had medication for about 2-3 weeks. Informed to reschedule lab appt to be done in 2-3 weeks time and I would send refills.

## 2016-07-05 NOTE — Telephone Encounter (Signed)
Lab appt to check TSH scheduled today. Will await lab results to refill.

## 2016-07-26 ENCOUNTER — Other Ambulatory Visit: Payer: Medicare Other

## 2016-08-01 ENCOUNTER — Other Ambulatory Visit: Payer: Self-pay | Admitting: Internal Medicine

## 2016-08-01 DIAGNOSIS — E039 Hypothyroidism, unspecified: Secondary | ICD-10-CM

## 2016-08-02 ENCOUNTER — Other Ambulatory Visit (INDEPENDENT_AMBULATORY_CARE_PROVIDER_SITE_OTHER): Payer: Medicare Other

## 2016-08-02 DIAGNOSIS — E039 Hypothyroidism, unspecified: Secondary | ICD-10-CM

## 2016-08-02 LAB — TSH: TSH: 0.9 u[IU]/mL (ref 0.35–4.50)

## 2016-08-04 MED ORDER — LEVOTHYROXINE SODIUM 112 MCG PO TABS: 112.0000 ug | ORAL_TABLET | Freq: Every day | ORAL | 5 refills | Status: DC

## 2016-08-04 NOTE — Addendum Note (Signed)
Addended byDamita Dunnings D on: 08/04/2016 07:58 AM   Modules accepted: Orders

## 2016-08-10 ENCOUNTER — Telehealth: Payer: Self-pay | Admitting: Internal Medicine

## 2016-08-10 DIAGNOSIS — M810 Age-related osteoporosis without current pathological fracture: Secondary | ICD-10-CM

## 2016-08-10 NOTE — Telephone Encounter (Signed)
Caller name: Khyler  Relation to pt: self Call back number: [phone] Pharmacy:  Reason for call: Pt states that it is time for her to have her Bone Density done, so if possible to put in a referral for the Bone Density. Pt is requesting to [place]. Please advise.

## 2016-08-10 NOTE — Telephone Encounter (Signed)
Please advise if okay to place order, last bone density in 2014.

## 2016-08-11 NOTE — Telephone Encounter (Signed)
Bone density ordered.

## 2016-08-11 NOTE — Telephone Encounter (Signed)
Okay to proceed.  

## 2016-11-06 ENCOUNTER — Other Ambulatory Visit: Payer: Self-pay

## 2016-11-06 DIAGNOSIS — E039 Hypothyroidism, unspecified: Secondary | ICD-10-CM

## 2016-11-06 MED ORDER — LEVOTHYROXINE SODIUM 112 MCG PO TABS: 112.0000 ug | ORAL_TABLET | Freq: Every day | ORAL | 4 refills | Status: DC

## 2017-01-02 ENCOUNTER — Encounter: Payer: Self-pay | Admitting: Podiatry

## 2017-01-02 ENCOUNTER — Ambulatory Visit (INDEPENDENT_AMBULATORY_CARE_PROVIDER_SITE_OTHER): Payer: Medicare Other | Admitting: Podiatry

## 2017-01-02 DIAGNOSIS — B351 Tinea unguium: Secondary | ICD-10-CM | POA: Diagnosis not present

## 2017-01-02 DIAGNOSIS — M79676 Pain in unspecified toe(s): Secondary | ICD-10-CM

## 2017-01-02 NOTE — Progress Notes (Signed)
   Subjective:    Patient ID: Marie Haynes, female    DOB: 29-Jan-1932, 83 y.o.   MRN: [id number]  HPI: She presents today with chief concerns of the hallux nails bilaterally. She states that they're thick and discolored and have been this way for several months. She states that she gets regular pedicures but she's concerned about the nail may be lifting on her left foot.  Review of Systems  All other systems reviewed and are negative.      Objective:   Physical Exam: Vital signs are stable she is alert and oriented 3. Pulses are palpable. Neurologic sensorium is intact. Degenerative reflexes are intact. Muscle strength was 5 over 5 dorsiflexion plantar flexors and inverters everters all into the musculature is intact. She does have thickened nail plates with distal onycholysis of the hallux bilaterally. This this distal onycholysis may be associated with onychomycosis or nail dystrophy.        Assessment & Plan:    Assessment: Nail dystrophy pain in limb second and onychomycosis.  Plan: Debridement toenails 1 through 5 bilateral follow-up with [place] as necessary.

## 2017-01-22 DIAGNOSIS — H1852 Epithelial (juvenile) corneal dystrophy: Secondary | ICD-10-CM | POA: Diagnosis not present

## 2017-01-22 DIAGNOSIS — Z961 Presence of intraocular lens: Secondary | ICD-10-CM | POA: Diagnosis not present

## 2017-01-22 DIAGNOSIS — H43813 Vitreous degeneration, bilateral: Secondary | ICD-10-CM | POA: Diagnosis not present

## 2017-02-26 ENCOUNTER — Encounter: Payer: Self-pay | Admitting: Internal Medicine

## 2017-02-26 ENCOUNTER — Ambulatory Visit (INDEPENDENT_AMBULATORY_CARE_PROVIDER_SITE_OTHER): Payer: Medicare Other | Admitting: Internal Medicine

## 2017-02-26 VITALS — BP 118/64 | HR 64 | Temp 97.6°F | Resp 14 | Ht 63.0 in | Wt 126.1 lb

## 2017-02-26 DIAGNOSIS — E785 Hyperlipidemia, unspecified: Secondary | ICD-10-CM

## 2017-02-26 DIAGNOSIS — E039 Hypothyroidism, unspecified: Secondary | ICD-10-CM | POA: Diagnosis not present

## 2017-02-26 DIAGNOSIS — M810 Age-related osteoporosis without current pathological fracture: Secondary | ICD-10-CM | POA: Diagnosis not present

## 2017-02-26 DIAGNOSIS — R0989 Other specified symptoms and signs involving the circulatory and respiratory systems: Secondary | ICD-10-CM

## 2017-02-26 DIAGNOSIS — D649 Anemia, unspecified: Secondary | ICD-10-CM

## 2017-02-26 DIAGNOSIS — Z09 Encounter for follow-up examination after completed treatment for conditions other than malignant neoplasm: Secondary | ICD-10-CM | POA: Diagnosis not present

## 2017-02-26 DIAGNOSIS — Z0000 Encounter for general adult medical examination without abnormal findings: Secondary | ICD-10-CM

## 2017-02-26 DIAGNOSIS — Z136 Encounter for screening for cardiovascular disorders: Secondary | ICD-10-CM

## 2017-02-26 LAB — CBC WITH DIFFERENTIAL/PLATELET
Basophils Absolute: 0 10*3/uL (ref 0.0–0.1)
Basophils Relative: 1 % (ref 0.0–3.0)
EOS ABS: 0.3 10*3/uL (ref 0.0–0.7)
EOS PCT: 5.3 % — AB (ref 0.0–5.0)
HCT: 37.9 % (ref 36.0–46.0)
Hemoglobin: 12.3 g/dL (ref 12.0–15.0)
LYMPHS ABS: 1 10*3/uL (ref 0.7–4.0)
Lymphocytes Relative: 20 % (ref 12.0–46.0)
MCHC: 32.5 g/dL (ref 30.0–36.0)
MCV: 93.5 fl (ref 78.0–100.0)
MONO ABS: 0.6 10*3/uL (ref 0.1–1.0)
Monocytes Relative: 12.3 % — ABNORMAL HIGH (ref 3.0–12.0)
NEUTROS PCT: 61.4 % (ref 43.0–77.0)
Neutro Abs: 3 10*3/uL (ref 1.4–7.7)
Platelets: 197 10*3/uL (ref 150.0–400.0)
RBC: 4.06 Mil/uL (ref 3.87–5.11)
RDW: 13.6 % (ref 11.5–15.5)
WBC: 4.9 10*3/uL (ref 4.0–10.5)

## 2017-02-26 LAB — LIPID PANEL
CHOL/HDL RATIO: 3
CHOLESTEROL: 234 mg/dL — AB (ref 0–200)
HDL: 89.5 mg/dL (ref >39.00)
LDL CALC: 131 mg/dL — AB (ref 0–99)
NONHDL: 144.8
TRIGLYCERIDES: 67 mg/dL (ref 0.0–149.0)
VLDL: 13.4 mg/dL (ref 0.0–40.0)

## 2017-02-26 LAB — AST: AST: 17 U/L (ref 0–37)

## 2017-02-26 LAB — TSH

## 2017-02-26 LAB — ALT: ALT: 17 U/L (ref 0–35)

## 2017-02-26 NOTE — Patient Instructions (Signed)
GO TO THE LAB : Get the blood work     GO TO THE FRONT DESK Schedule your next appointment for a  Routine visit in 1 year  Please schedule a Medicare wellness with one of our nurses at your earliest convenience

## 2017-02-26 NOTE — Progress Notes (Signed)
Pre visit review using our clinic review tool, if applicable. No additional management support is needed unless otherwise documented below in the visit note. 

## 2017-02-26 NOTE — Assessment & Plan Note (Addendum)
-  Td 2012 ;pneumonia shot 1998 and 2011; prevnar 2015; shingles shot- flu shot: not interested -[facility] Last Colonoscopy:  06/05/1998--actual report not available  Colon bx -- hyperrplastic polyp   Dr Collene Mares [phone], again states she won't do another one  -Female care:  No further PAPs Breast ca screening:  declined further MMG , no h/o abnormal ones (ok per guidelines) -Diet , exercise discussed

## 2017-02-26 NOTE — Progress Notes (Signed)
Subjective:    Patient ID: Marie Haynes, female    DOB: Jul 23, 1931, 83 y.o.   MRN: [id number]  DOS:  02/26/2017 Type of visit - description : rov Interval history:  Hypothyroidism: Good compliance with medications Osteoporosis: On calcium and vitamin D, reports she desires a bone density test High cholesterol: Diet control. MSK: Martin Majestic to the ER few months ago with severe neck pain, subsequently saw neurosurgery. Currently doing okay.   Review of Systems Denies chest pain or difficulty breathing No nausea, vomiting, diarrhea. No anxiety or depression, she remains very active. DJD: Has occasional aches and pains, range of motion of the neck decreased but no severe problems.  Past Medical History:  Diagnosis Date  . Anxiety and depression   . DJD (degenerative joint disease)   . Hypothyroidism   . Increased urinary frequency    Dr. Matilde Sprang  . LEG CRAMPS, NOCTURNAL 02/22/2007   Qualifier: Diagnosis of  By: Larose Kells MD, [place] [facility]   . Spinal stenosis   . Stress incontinence    (saw Urology 5-09)    . Symptomatic cholelithiasis 2017   reluctant to have surgery  . Urge and stress incontinence    Dr. Matilde Sprang    Past Surgical History:  Procedure Laterality Date  . BACK SURGERY  [phone]  . DILATION AND CURETTAGE OF UTERUS      Social History   Social History  . Marital status: Married    Spouse name: N/A  . Number of children: 3  . Years of education: N/A   Occupational History  . retired Psychologist, prison and probation services  Retired   Social History Main Topics  . Smoking status: Never Smoker  . Smokeless tobacco: Never Used  . Alcohol use Yes     Comment: Rare  . Drug use: No  . Sexual activity: Not on file   Other Topics Concern  . Not on file   Social History Narrative   Household- pt and husband   Daughter bipolar      Allergies as of 02/26/2017      Reactions   Oxycodone-acetaminophen Anaphylaxis, Nausea And Vomiting   Codeine Other (See Comments)   constipation      Medication List       Accurate as of 02/26/17 11:59 PM. Always use your most recent med list.          CALCIUM + D PO Take 3 tablets by mouth 2 (two) times daily.   cholecalciferol 400 units Tabs tablet Commonly known as:  VITAMIN D Take 4,000 Units by mouth daily.   fish oil-omega-3 fatty acids 1000 MG capsule Take 1 g by mouth 2 (two) times daily.   GLUCOSAMINE 1500 COMPLEX PO Take 2 tablets by mouth 2 (two) times daily.   levothyroxine 112 MCG tablet Commonly known as:  SYNTHROID, LEVOTHROID Take 1 tablet (112 mcg total) by mouth daily before breakfast.   magnesium 30 MG tablet Take 90 mg by mouth every evening.   RED YEAST RICE PO Take 1 tablet by mouth daily.   vitamin A 7500 UNIT capsule Take 7,500 Units by mouth daily.   vitamin C 500 MG tablet Commonly known as:  ASCORBIC ACID Take 500 mg by mouth 2 (two) times daily.   vitamin E 400 UNIT capsule Take 400 Units by mouth daily.   vitamin k 100 MCG tablet Take 100 mcg by mouth daily.            Discharge Care Instructions  Start     Ordered   02/26/17 0000  AST     02/26/17 1050   02/26/17 0000  ALT     02/26/17 1050   02/26/17 0000  Lipid panel     02/26/17 1050   02/26/17 0000  CBC w/Diff     02/26/17 1050   02/26/17 0000  TSH     02/26/17 1050   02/26/17 0000  Vitamin D 1,25 dihydroxy     02/26/17 1050   02/26/17 0000  DG Bone Density    Question Answer Comment  Reason for Exam (SYMPTOM  OR DIAGNOSIS REQUIRED) osteoporosis f/u   Preferred imaging location? [facility]      02/26/17 1054   02/26/17 0000  US Aorta    Question Answer Comment  Reason for Exam (SYMPTOM  OR DIAGNOSIS REQUIRED) AAA screening, palpable aorta   Preferred imaging location? [facility]      02/26/17 1054         Objective:   Physical Exam BP 118/64 (BP Location: Left Arm, Patient Position: Sitting, Cuff Size: Small)   Pulse 64   Temp 97.6 F (36.4 C) (Oral)    Resp 14   Ht 5\' 3"  (1.6 m)   Wt 126 lb 2 oz (57.2 kg)   SpO2 96%   BMI 22.34 kg/m   General:   Well developed, well nourished . NAD.  Neck: No  thyromegaly  HEENT:  Normocephalic . Face symmetric, atraumatic Lungs:  CTA B Normal respiratory effort, no intercostal retractions, no accessory muscle use. Heart: RRR,  no murmur.  No pretibial edema bilaterally  Abdomen:  Not distended, soft, non-tender. No rebound or rigidity.  Palpable aorta, upper abdomen, question of bruit. Nontender. MSK: Hands and wrists with changes of DJD without redness or puffiness. Skin: Exposed areas without rash. Not pale. Not jaundice Neurologic:  alert & oriented X3.  Speech normal, gait appropriate for age and unassisted Strength symmetric and appropriate for age.  Psych: Cognition and judgment appear intact.  Cooperative with normal attention span and concentration.  Behavior appropriate. No anxious or depressed appearing.    Assessment & Plan:    Assessment Anxiety depression Hypothyroidism Hyperlipidemia, mild TIA 04-2015 --->  Echo 09-2015 negative brain MRI/MRA 05-2015  essentially neg Stress incontinence -- per urology, intol to meds  MSK: --Osteoporosis:  Took Fosamax x 2 years , self DC'd 2014 due to potential for s/e.  T score 2014 -2.7. Declined to take prolia 06-2014 --DJD --Spinal stenosis Severe leg cramps:  gabapentin (dizziness) , tonic water helps some, requip prn Palpable Ao: [place] (-) AAA 2014 GB stones [place] 05-2015, reluctant to have surgery   PLAN: Primary care issues reviewed Anxiety depression: Not an issue at this time Hypothyroidism: On Synthroid, checking labs Hyperlipidemia: Diet control, check a FLP, AST, ALT Osteoporosis: Untreated, she does like to redo a bone density test. We'll schedule. Likes her vitamin D checked. DJD: Severe neck DJD, went to the ER few months ago with acute pain, then she saw Dr. Carloyn Manner, no documentation, they agreed on conservative  treatment. Currently doing okay with only  on and off pain at different joints and the neck.  Palpable aorta: She did have ultrasound negative for an aneurysm 5 years ago. Repeat ultrasound. Anemia: Mild anemia noted on lab review, recheck a CBC RTC one year   ========= Anxiety depression: Mild depression from time to time, declined   treatment Hypothyroidism: On Synthroid, check a TSH Hyperlipidemia: Reports Pravachol was  very expensive, recommend to check with his pharmacy again as it is a generic. Let me know if she decides to take statins. Will need blood work. Check a BMP and a FLP per pt request  H/o TIA: rec aspirin qd , we are trying to control CV RFs Osteoporosis: On calcium and vitamin D. Risk of fractures discuss, to let me know if she decides to get treatment. RTC one year

## 2017-02-27 NOTE — Assessment & Plan Note (Addendum)
Primary care issues reviewed Anxiety depression: Not an issue at this time Hypothyroidism: On Synthroid, checking labs Hyperlipidemia: Diet control, check a FLP, AST, ALT Osteoporosis: Untreated, she does like to redo a bone density test. We'll schedule. Likes her vitamin D checked. DJD: Severe neck DJD, went to the ER few months ago with acute pain, then she saw Dr. Carloyn Manner, no documentation, they agreed on conservative treatment. Currently doing okay with only  on and off pain at different joints and the neck.  Palpable aorta: She did have ultrasound negative for an aneurysm 5 years ago. Repeat ultrasound. Anemia: Mild anemia noted on lab review, recheck a CBC RTC one year

## 2017-03-01 LAB — VITAMIN D 1,25 DIHYDROXY
VITAMIN D 1, 25 (OH) TOTAL: 29 pg/mL (ref 18–72)
VITAMIN D3 1, 25 (OH): 29 pg/mL
Vitamin D2 1, 25 (OH)2: <8 pg/mL

## 2017-03-01 MED ORDER — LEVOTHYROXINE SODIUM 100 MCG PO TABS: 100.0000 ug | ORAL_TABLET | Freq: Every day | ORAL | 1 refills | Status: DC

## 2017-03-01 NOTE — Addendum Note (Signed)
Addended byDamita Dunnings D on: 03/01/2017 09:28 AM   Modules accepted: Orders

## 2017-03-05 ENCOUNTER — Ambulatory Visit (HOSPITAL_BASED_OUTPATIENT_CLINIC_OR_DEPARTMENT_OTHER)
Admission: RE | Admit: 2017-03-05 | Discharge: 2017-03-05 | Disposition: A | Discharge status: Home / Self Care | Payer: Medicare Other | Source: Ambulatory Visit | Attending: Internal Medicine | Admitting: Internal Medicine

## 2017-03-05 DIAGNOSIS — Z136 Encounter for screening for cardiovascular disorders: Secondary | ICD-10-CM | POA: Insufficient documentation

## 2017-03-05 DIAGNOSIS — R0989 Other specified symptoms and signs involving the circulatory and respiratory systems: Secondary | ICD-10-CM | POA: Insufficient documentation

## 2017-03-05 DIAGNOSIS — M810 Age-related osteoporosis without current pathological fracture: Secondary | ICD-10-CM | POA: Insufficient documentation

## 2017-03-05 DIAGNOSIS — E785 Hyperlipidemia, unspecified: Secondary | ICD-10-CM | POA: Diagnosis not present

## 2017-03-05 DIAGNOSIS — M8588 Other specified disorders of bone density and structure, other site: Secondary | ICD-10-CM | POA: Diagnosis not present

## 2017-03-05 DIAGNOSIS — I714 Abdominal aortic aneurysm, without rupture: Secondary | ICD-10-CM | POA: Diagnosis not present

## 2017-03-05 DIAGNOSIS — M8589 Other specified disorders of bone density and structure, multiple sites: Secondary | ICD-10-CM | POA: Diagnosis not present

## 2017-04-23 ENCOUNTER — Other Ambulatory Visit: Payer: Medicare Other

## 2017-04-24 ENCOUNTER — Other Ambulatory Visit: Payer: Medicare Other

## 2017-04-30 ENCOUNTER — Other Ambulatory Visit (INDEPENDENT_AMBULATORY_CARE_PROVIDER_SITE_OTHER): Payer: Medicare Other

## 2017-04-30 DIAGNOSIS — E039 Hypothyroidism, unspecified: Secondary | ICD-10-CM

## 2017-04-30 LAB — TSH: TSH: 0.21 u[IU]/mL — ABNORMAL LOW (ref 0.35–4.50)

## 2017-05-02 MED ORDER — LEVOTHYROXINE SODIUM 88 MCG PO TABS: 88.0000 ug | ORAL_TABLET | Freq: Every day | ORAL | 2 refills | Status: DC

## 2017-05-02 NOTE — Addendum Note (Signed)
Addended byDamita Dunnings D on: 05/02/2017 04:48 PM   Modules accepted: Orders

## 2017-05-07 ENCOUNTER — Other Ambulatory Visit: Payer: Self-pay | Admitting: Internal Medicine

## 2017-05-07 MED ORDER — LEVOTHYROXINE SODIUM 88 MCG PO TABS: 88.0000 ug | ORAL_TABLET | Freq: Every day | ORAL | 2 refills | Status: DC

## 2017-05-15 ENCOUNTER — Encounter (HOSPITAL_COMMUNITY): Payer: Self-pay | Admitting: Emergency Medicine

## 2017-05-15 ENCOUNTER — Ambulatory Visit (HOSPITAL_COMMUNITY)
Admission: EM | Admit: 2017-05-15 | Discharge: 2017-05-15 | Disposition: A | Discharge status: Home / Self Care | Payer: Medicare Other | Attending: Internal Medicine | Admitting: Internal Medicine

## 2017-05-15 DIAGNOSIS — R6889 Other general symptoms and signs: Secondary | ICD-10-CM

## 2017-05-15 MED ORDER — OSELTAMIVIR PHOSPHATE 75 MG PO CAPS: 75.0000 mg | ORAL_CAPSULE | Freq: Two times a day (BID) | ORAL | 0 refills | Status: DC

## 2017-05-15 NOTE — ED Triage Notes (Signed)
PT reports body aches, ear pain, slight cough, sore throat, congestion since Saturday.

## 2017-05-15 NOTE — ED Provider Notes (Signed)
[facility]    CSN: [id number] Arrival date & time: 05/15/17  1351     History   Chief Complaint Chief Complaint  Patient presents with  . Influenza    HPI Marie Haynes is a 83 y.o. female.   Patient reports body aches, head congestion and headache x10 days. She denies fever, N/V/D. She has been taking Echinechia and tylenol.       Past Medical History:  Diagnosis Date  . Anxiety and depression   . DJD (degenerative joint disease)   . Hypothyroidism   . Increased urinary frequency    Dr. Matilde Sprang  . LEG CRAMPS, NOCTURNAL 02/22/2007   Qualifier: Diagnosis of  By: Larose Kells MD, [place] [facility]   . Spinal stenosis   . Stress incontinence    (saw Urology 5-09)    . Symptomatic cholelithiasis 2017   reluctant to have surgery  . Urge and stress incontinence    Dr. Matilde Sprang    Patient Active Problem List   Diagnosis Date Noted  . Follow-up -------------PCP NOTES 02/10/2015  . Annual physical exam 09/04/2012  . Hyperlipidemia, mild 09/03/2012  . Anxiety and depression 06/23/2011  . SKIN LESION 03/25/2010  . OSTEOARTHRITIS 12/12/2007  . Hypothyroidism 02/22/2007  . SPINAL STENOSIS 02/22/2007  . LEG CRAMPS, NOCTURNAL 02/22/2007  . Osteoporosis 02/22/2007    Past Surgical History:  Procedure Laterality Date  . BACK SURGERY  [phone]  . DILATION AND CURETTAGE OF UTERUS      OB History    No data available       Home Medications    Prior to Admission medications   Medication Sig Start Date End Date Taking? Authorizing Provider  Calcium Carbonate-Vitamin D (CALCIUM + D PO) Take 3 tablets by mouth 2 (two) times daily.     [provider]  cholecalciferol (VITAMIN D) 400 UNITS TABS Take 4,000 Units by mouth daily.     [provider]  fish oil-omega-3 fatty acids 1000 MG capsule Take 1 g by mouth 2 (two) times daily.      [provider]  Glucosamine-Chondroit-Vit C-Mn (GLUCOSAMINE 1500 COMPLEX PO) Take 2  tablets by mouth 2 (two) times daily.      [provider]  levothyroxine (SYNTHROID, LEVOTHROID) 88 MCG tablet Take 1 tablet (88 mcg total) by mouth daily before breakfast. 05/07/17   Colon Branch, MD  magnesium 30 MG tablet Take 90 mg by mouth every evening.    [provider]  oseltamivir (TAMIFLU) 75 MG capsule Take 1 capsule (75 mg total) by mouth every 12 (twelve) hours. 05/15/17   Harrie Foreman, MD  Red Yeast Rice Extract (RED YEAST RICE PO) Take 1 tablet by mouth daily.    [provider]  vitamin A 7500 UNIT capsule Take 7,500 Units by mouth daily.    [provider]  vitamin C (ASCORBIC ACID) 500 MG tablet Take 500 mg by mouth 2 (two) times daily.     [provider]  vitamin E 400 UNIT capsule Take 400 Units by mouth daily.     [provider]  vitamin k 100 MCG tablet Take 100 mcg by mouth daily.      [provider]    Family History Family History  Problem Relation Age of Onset  . Heart failure Mother   . Heart attack Mother 24       dx in her 41s  . Colon cancer Neg Hx   .  Breast cancer Neg Hx   . Diabetes Neg Hx     Social History Social History   Tobacco Use  . Smoking status: Never Smoker  . Smokeless tobacco: Never Used  Substance Use Topics  . Alcohol use: Yes    Comment: Rare  . Drug use: No     Allergies   Oxycodone-acetaminophen and Codeine   Review of Systems Review of Systems  Constitutional: Negative for chills and fever.  HENT: Negative for sore throat and tinnitus.   Eyes: Negative for redness.  Respiratory: Positive for cough. Negative for shortness of breath.   Cardiovascular: Negative for chest pain and palpitations.  Gastrointestinal: Negative for abdominal pain, diarrhea, nausea and vomiting.  Genitourinary: Negative for dysuria, frequency and urgency.  Musculoskeletal: Negative for myalgias.  Skin: Negative for rash.       No lesions  Neurological: Negative for  weakness.  Hematological: Does not bruise/bleed easily.  Psychiatric/Behavioral: Negative for suicidal ideas.     Physical Exam Triage Vital Signs ED Triage Vitals  Enc Vitals Group     BP 05/15/17 1404 132/75     Pulse Rate 05/15/17 1404 99     Resp 05/15/17 1404 18     Temp 05/15/17 1404 99.9 F (37.7 C)     Temp Source 05/15/17 1404 Temporal     SpO2 05/15/17 1404 96 %     Weight 05/15/17 1402 122 lb (55.3 kg)     Height --      Head Circumference --      Peak Flow --      Pain Score 05/15/17 1403 8     Pain Loc --      Pain Edu? --      Excl. in [place]? --    No data found.  Updated Vital Signs BP 132/75   Pulse 99   Temp 99.9 F (37.7 C) (Temporal)   Resp 18   Wt 122 lb (55.3 kg)   SpO2 96%   BMI 21.61 kg/m   Visual Acuity Right Eye Distance:   Left Eye Distance:   Bilateral Distance:    Right Eye Near:   Left Eye Near:    Bilateral Near:     Physical Exam  Constitutional: She is oriented to person, place, and time. She appears well-developed and well-nourished. No distress.  HENT:  Head: Normocephalic and atraumatic.  Mouth/Throat: Oropharynx is clear and moist.  Eyes: Conjunctivae and EOM are normal. Pupils are equal, round, and reactive to light. No scleral icterus.  Neck: Normal range of motion. Neck supple. No JVD present. No tracheal deviation present. No thyromegaly present.  Cardiovascular: Normal rate, regular rhythm and normal heart sounds. Exam reveals no gallop and no friction rub.  No murmur heard. Pulmonary/Chest: Effort normal and breath sounds normal.  Abdominal: Soft. Bowel sounds are normal. She exhibits no distension. There is no tenderness.  Musculoskeletal: Normal range of motion. She exhibits no edema.  Lymphadenopathy:    She has no cervical adenopathy.  Neurological: She is alert and oriented to person, place, and time. No cranial nerve deficit.  Skin: Skin is warm and dry.  Psychiatric: She has a normal mood and affect. Her  behavior is normal. Judgment and thought content normal.  Nursing note and vitals reviewed.    UC Treatments / Results  Labs (all labs ordered are listed, but only abnormal results are displayed) Labs Reviewed - No data to display  EKG  EKG Interpretation None  Radiology No results found.  Procedures Procedures (including critical care time)  Medications Ordered in UC Medications - No data to display   Initial Impression / Assessment and Plan / UC Course  I have reviewed the triage vital signs and the nursing notes.  Pertinent labs & imaging results that were available during my care of the patient were reviewed by me and considered in my medical decision making (see chart for details).     Flu-like illness; Rx tamiflu due to age. Recommend oral hydration as we do not know her kidney function. Will have close follow-up with PMD.   Final Clinical Impressions(s) / UC Diagnoses   Final diagnoses:  Flu-like symptoms    ED Discharge Orders        Ordered    oseltamivir (TAMIFLU) 75 MG capsule  Every 12 hours     05/15/17 1532       Controlled Substance Prescriptions [place] Controlled Substance Registry consulted? Not Applicable   Harrie Foreman, MD 05/15/17 1558

## 2017-06-12 DIAGNOSIS — Z6821 Body mass index (BMI) 21.0-21.9, adult: Secondary | ICD-10-CM | POA: Diagnosis not present

## 2017-06-12 DIAGNOSIS — M47812 Spondylosis without myelopathy or radiculopathy, cervical region: Secondary | ICD-10-CM | POA: Diagnosis not present

## 2017-07-03 ENCOUNTER — Other Ambulatory Visit (INDEPENDENT_AMBULATORY_CARE_PROVIDER_SITE_OTHER): Payer: Medicare Other

## 2017-07-03 DIAGNOSIS — E039 Hypothyroidism, unspecified: Secondary | ICD-10-CM

## 2017-07-03 LAB — TSH: TSH: 1.07 u[IU]/mL (ref 0.35–4.50)

## 2017-07-05 MED ORDER — LEVOTHYROXINE SODIUM 88 MCG PO TABS: 88.0000 ug | ORAL_TABLET | Freq: Every day | ORAL | 5 refills | Status: DC

## 2017-08-22 NOTE — Progress Notes (Addendum)
Subjective:   Marie Haynes is a 84 y.o. female who presents for Medicare Annual (Subsequent) preventive examination.  Review of Systems: No ROS.  Medicare Wellness Visit. Additional factors are reflected in the social history.  Cardiac Risk Factors include: advanced age (>48men, >40 women);dyslipidemia Sleep patterns: Sleeps well for about 6 hrs. Home Safety/Smoke Alarms: Feels safe in home. Smoke alarms in place.  Living environment; residence and Adult nurse: lives with husband. One story home. Seat Belt Safety/Bike Helmet: Wears seat belt.   Female:   Pap- No longer doing routine screening due to age.      Mammo- declines      Dexa scan-  03/05/17-osteopenia      CCS- No longer doing routine screening due to age.      Objective:     Vitals: BP 122/80 (BP Location: Left Arm, Patient Position: Sitting, Cuff Size: Normal)   Pulse 77   Ht 5\' 3"  (1.6 m)   Wt 125 lb 9.6 oz (57 kg)   SpO2 99%   BMI 22.25 kg/m   Body mass index is 22.25 kg/m.  Advanced Directives 08/27/2017 05/10/2016  Does Patient Have a Medical Advance Directive? No No  Would patient like information on creating a medical advance directive? Yes (MAU/Ambulatory/Procedural Areas - Information given) No - Patient declined    Tobacco Social History   Tobacco Use  Smoking Status Never Smoker  Smokeless Tobacco Never Used     Counseling given: Not Answered   Clinical Intake: Pain : No/denies pain   Past Medical History:  Diagnosis Date  . Anxiety and depression   . DJD (degenerative joint disease)   . Hypothyroidism   . Increased urinary frequency    Dr. Matilde Sprang  . LEG CRAMPS, NOCTURNAL 02/22/2007   Qualifier: Diagnosis of  By: Larose Kells MD, [place] [facility]   . Spinal stenosis   . Stress incontinence    (saw Urology 5-09)    . Symptomatic cholelithiasis 2017   reluctant to have surgery  . Urge and stress incontinence    Dr. Matilde Sprang   Past Surgical History:  Procedure  Laterality Date  . BACK SURGERY  [phone]  . DILATION AND CURETTAGE OF UTERUS     Family History  Problem Relation Age of Onset  . Heart failure Mother   . Heart attack Mother 10       dx in her 85s  . Colon cancer Neg Hx   . Breast cancer Neg Hx   . Diabetes Neg Hx    Social History   Socioeconomic History  . Marital status: Married    Spouse name: Not on file  . Number of children: 3  . Years of education: Not on file  . Highest education level: Not on file  Occupational History  . Occupation: retired Pensions consultant: RETIRED  Social Needs  . Financial resource strain: Not on file  . Food insecurity:    Worry: Not on file    Inability: Not on file  . Transportation needs:    Medical: Not on file    Non-medical: Not on file  Tobacco Use  . Smoking status: Never Smoker  . Smokeless tobacco: Never Used  Substance and Sexual Activity  . Alcohol use: Yes    Comment: Rare  . Drug use: No  . Sexual activity: Not on file  Lifestyle  . Physical activity:    Days per week: Not on file  Minutes per session: Not on file  . Stress: Not on file  Relationships  . Social connections:    Talks on phone: Not on file    Gets together: Not on file    Attends religious service: Not on file    Active member of club or organization: Not on file    Attends meetings of clubs or organizations: Not on file    Relationship status: Not on file  Other Topics Concern  . Not on file  Social History Narrative   Household- pt and husband   Daughter bipolar    Outpatient Encounter Medications as of 08/27/2017  Medication Sig  . Calcium Carbonate-Vitamin D (CALCIUM + D PO) Take 3 tablets by mouth 2 (two) times daily.   . cholecalciferol (VITAMIN D) 400 UNITS TABS Take 4,000 Units by mouth daily.   . fish oil-omega-3 fatty acids 1000 MG capsule Take 1 g by mouth 2 (two) times daily.    . Glucosamine-Chondroit-Vit C-Mn (GLUCOSAMINE 1500 COMPLEX PO) Take 2 tablets by mouth 2  (two) times daily.    Marland Kitchen levothyroxine (SYNTHROID, LEVOTHROID) 88 MCG tablet Take 1 tablet (88 mcg total) by mouth daily before breakfast.  . magnesium 30 MG tablet Take 90 mg by mouth every evening.  . Red Yeast Rice Extract (RED YEAST RICE PO) Take 1 tablet by mouth daily.  . vitamin A 7500 UNIT capsule Take 7,500 Units by mouth daily.  . vitamin C (ASCORBIC ACID) 500 MG tablet Take 500 mg by mouth 2 (two) times daily.   . vitamin E 400 UNIT capsule Take 400 Units by mouth daily.   . vitamin k 100 MCG tablet Take 100 mcg by mouth daily.    . [DISCONTINUED] oseltamivir (TAMIFLU) 75 MG capsule Take 1 capsule (75 mg total) by mouth every 12 (twelve) hours.   No facility-administered encounter medications on file as of 08/27/2017.     Activities of Daily Living In your present state of health, do you have any difficulty performing the following activities: 08/27/2017 02/26/2017  Hearing? N N  Vision? N N  Difficulty concentrating or making decisions? N N  Walking or climbing stairs? N N  Dressing or bathing? N N  Doing errands, shopping? N N  Preparing Food and eating ? N -  Using the Toilet? N -  In the past six months, have you accidently leaked urine? N -  Comment wears pads -  Do you have problems with loss of bowel control? N -  Managing your Medications? N -  Managing your Finances? N -  Housekeeping or managing your Housekeeping? N -  Some recent data might be hidden    Patient Care Team: Colon Branch, MD as PCP - General Bjorn Loser, MD as Consulting Physician (Urology) Ena Dawley, MD as Consulting Physician (Obstetrics and Gynecology) Georganna Skeans, MD as Consulting Physician (General Surgery)    Assessment:   This is a routine wellness examination for [place]. Physical assessment deferred to PCP.  Exercise Activities and Dietary recommendations Current Exercise Habits: The patient does not participate in regular exercise at present, Exercise limited by: None  identified   Diet (meal preparation, eat out, water intake, caffeinated beverages, dairy products, fruits and vegetables): in general, a "healthy" diet  , on average, 2 meals per day    Goals    . Increase physical activity       Fall Risk Fall Risk  08/27/2017 02/26/2017 02/23/2016 09/06/2015 02/09/2015  Falls in the past year?  Yes No No No No  Number falls in past yr: 1 - - - -  Injury with Fall? Yes - - - -  Follow up Education provided;Falls prevention discussed - - - -    Depression Screen PHQ 2/9 Scores 08/27/2017 02/26/2017 02/23/2016 09/06/2015  PHQ - 2 Score 0 0 1 0  PHQ- 9 Score - 0 - -     Cognitive Function MMSE - Mini Mental State Exam 08/27/2017  Orientation to time 5  Orientation to Place 5  Registration 3  Attention/ Calculation 5  Recall 3  Language- name 2 objects 2  Language- repeat 1  Language- follow 3 step command 3  Language- read & follow direction 1  Write a sentence 1  Copy design 1  Total score 30        Immunization History  Administered Date(s) Administered  . Influenza Whole 03/25/2010  . Influenza, Seasonal, Injecte, Preservative Fre 07/02/2012  . Pneumococcal Conjugate-13 10/02/2013  . Pneumococcal Polysaccharide-23 06/05/1996, 08/09/2009  . Td 06/05/2001  . Tdap 10/30/2010    Screening Tests Health Maintenance  Topic Date Due  . INFLUENZA VACCINE  09/02/2017 (Originally 01/03/2017)  . TETANUS/TDAP  10/29/2020  . DEXA SCAN  Completed  . PNA vac Low Risk Adult  Completed       Plan:   Follow up with Dr.Paz as scheduled 02/28/17.   Continue to eat heart healthy diet (full of fruits, vegetables, whole grains, lean protein, water--limit salt, fat, and sugar intake) and increase physical activity as tolerated.  Continue doing brain stimulating activities (puzzles, reading, adult coloring books, staying active) to keep memory sharp.   Bring a copy of your living will and/or healthcare power of attorney to your next office visit.    I  have personally reviewed and noted the following in the patient's chart:   . Medical and social history . Use of alcohol, tobacco or illicit drugs  . Current medications and supplements . Functional ability and status . Nutritional status . Physical activity . Advanced directives . List of other physicians . Hospitalizations, surgeries, and ER visits in previous 12 months . Vitals . Screenings to include cognitive, depression, and falls . Referrals and appointments  In addition, I have reviewed and discussed with patient certain preventive protocols, quality metrics, and best practice recommendations. A written personalized care plan for preventive services as well as general preventive health recommendations were provided to patient.     Naaman Plummer [place], [place]  08/27/2017  Kathlene November, MD

## 2017-08-27 ENCOUNTER — Encounter: Payer: Self-pay | Admitting: *Deleted

## 2017-08-27 ENCOUNTER — Ambulatory Visit (INDEPENDENT_AMBULATORY_CARE_PROVIDER_SITE_OTHER): Payer: Medicare Other | Admitting: *Deleted

## 2017-08-27 VITALS — BP 122/80 | HR 77 | Ht 63.0 in | Wt 125.6 lb

## 2017-08-27 DIAGNOSIS — Z0000 Encounter for general adult medical examination without abnormal findings: Secondary | ICD-10-CM | POA: Diagnosis not present

## 2017-08-27 NOTE — Patient Instructions (Signed)
Follow up with Dr.Paz as scheduled 02/28/17.   Continue to eat heart healthy diet (full of fruits, vegetables, whole grains, lean protein, water--limit salt, fat, and sugar intake) and increase physical activity as tolerated.  Continue doing brain stimulating activities (puzzles, reading, adult coloring books, staying active) to keep memory sharp.   Bring a copy of your living will and/or healthcare power of attorney to your next office visit.   Marie Haynes , Thank you for taking time to come for your Medicare Wellness Visit. I appreciate your ongoing commitment to your health goals. Please review the following plan we discussed and let me know if I can assist you in the future.   These are the goals we discussed: Goals    . Increase physical activity       This is a list of the screening recommended for you and due dates:  Health Maintenance  Topic Date Due  . Flu Shot  09/02/2017*  . Tetanus Vaccine  10/29/2020  . DEXA scan (bone density measurement)  Completed  . Pneumonia vaccines  Completed  *Topic was postponed. The date shown is not the original due date.    Health Maintenance for Postmenopausal Women Menopause is a normal process in which your reproductive ability comes to an end. This process happens gradually over a span of months to years, usually between the ages of 57 and 71. Menopause is complete when you have missed 12 consecutive menstrual periods. It is important to talk with your health care provider about some of the most common conditions that affect postmenopausal women, such as heart disease, cancer, and bone loss (osteoporosis). Adopting a healthy lifestyle and getting preventive care can help to promote your health and wellness. Those actions can also lower your chances of developing some of these common conditions. What should I know about menopause? During menopause, you may experience a number of symptoms, such as:  Moderate-to-severe hot flashes.  Night  sweats.  Decrease in sex drive.  Mood swings.  Headaches.  Tiredness.  Irritability.  Memory problems.  Insomnia.  Choosing to treat or not to treat menopausal changes is an individual decision that you make with your health care provider. What should I know about hormone replacement therapy and supplements? Hormone therapy products are effective for treating symptoms that are associated with menopause, such as hot flashes and night sweats. Hormone replacement carries certain risks, especially as you become older. If you are thinking about using estrogen or estrogen with progestin treatments, discuss the benefits and risks with your health care provider. What should I know about heart disease and stroke? Heart disease, heart attack, and stroke become more likely as you age. This may be due, in part, to the hormonal changes that your body experiences during menopause. These can affect how your body processes dietary fats, triglycerides, and cholesterol. Heart attack and stroke are both medical emergencies. There are many things that you can do to help prevent heart disease and stroke:  Have your blood pressure checked at least every 1-2 years. High blood pressure causes heart disease and increases the risk of stroke.  If you are 19-68 years old, ask your health care provider if you should take aspirin to prevent a heart attack or a stroke.  Do not use any tobacco products, including cigarettes, chewing tobacco, or electronic cigarettes. If you need help quitting, ask your health care provider.  It is important to eat a healthy diet and maintain a healthy weight. ? Be sure to  include plenty of vegetables, fruits, low-fat dairy products, and lean protein. ? Avoid eating foods that are high in solid fats, added sugars, or salt (sodium).  Get regular exercise. This is one of the most important things that you can do for your health. ? Try to exercise for at least 150 minutes each week.  The type of exercise that you do should increase your heart rate and make you sweat. This is known as moderate-intensity exercise. ? Try to do strengthening exercises at least twice each week. Do these in addition to the moderate-intensity exercise.  Know your numbers.Ask your health care provider to check your cholesterol and your blood glucose. Continue to have your blood tested as directed by your health care provider.  What should I know about cancer screening? There are several types of cancer. Take the following steps to reduce your risk and to catch any cancer development as early as possible. Breast Cancer  Practice breast self-awareness. ? This means understanding how your breasts normally appear and feel. ? It also means doing regular breast self-exams. Let your health care provider know about any changes, no matter how small.  If you are 73 or older, have a clinician do a breast exam (clinical breast exam or CBE) every year. Depending on your age, family history, and medical history, it may be recommended that you also have a yearly breast X-ray (mammogram).  If you have a family history of breast cancer, talk with your health care provider about genetic screening.  If you are at high risk for breast cancer, talk with your health care provider about having an MRI and a mammogram every year.  Breast cancer (BRCA) gene test is recommended for women who have family members with BRCA-related cancers. Results of the assessment will determine the need for genetic counseling and BRCA1 and for BRCA2 testing. BRCA-related cancers include these types: ? Breast. This occurs in males or females. ? Ovarian. ? Tubal. This may also be called fallopian tube cancer. ? Cancer of the abdominal or pelvic lining (peritoneal cancer). ? Prostate. ? Pancreatic.  Cervical, Uterine, and Ovarian Cancer Your health care provider may recommend that you be screened regularly for cancer of the pelvic  organs. These include your ovaries, uterus, and vagina. This screening involves a pelvic exam, which includes checking for microscopic changes to the surface of your cervix (Pap test).  For women ages 21-65, health care providers may recommend a pelvic exam and a Pap test every three years. For women ages 74-65, they may recommend the Pap test and pelvic exam, combined with testing for human papilloma virus (HPV), every five years. Some types of HPV increase your risk of cervical cancer. Testing for HPV may also be done on women of any age who have unclear Pap test results.  Other health care providers may not recommend any screening for nonpregnant women who are considered low risk for pelvic cancer and have no symptoms. Ask your health care provider if a screening pelvic exam is right for you.  If you have had past treatment for cervical cancer or a condition that could lead to cancer, you need Pap tests and screening for cancer for at least 20 years after your treatment. If Pap tests have been discontinued for you, your risk factors (such as having a new sexual partner) need to be reassessed to determine if you should start having screenings again. Some women have medical problems that increase the chance of getting cervical cancer. In these cases,  your health care provider may recommend that you have screening and Pap tests more often.  If you have a family history of uterine cancer or ovarian cancer, talk with your health care provider about genetic screening.  If you have vaginal bleeding after reaching menopause, tell your health care provider.  There are currently no reliable tests available to screen for ovarian cancer.  Lung Cancer Lung cancer screening is recommended for adults 55-80 years old who are at high risk for lung cancer because of a history of smoking. A yearly low-dose CT scan of the lungs is recommended if you:  Currently smoke.  Have a history of at least 30 pack-years of  smoking and you currently smoke or have quit within the past 15 years. A pack-year is smoking an average of one pack of cigarettes per day for one year.  Yearly screening should:  Continue until it has been 15 years since you quit.  Stop if you develop a health problem that would prevent you from having lung cancer treatment.  Colorectal Cancer  This type of cancer can be detected and can often be prevented.  Routine colorectal cancer screening usually begins at age 50 and continues through age 75.  If you have risk factors for colon cancer, your health care provider may recommend that you be screened at an earlier age.  If you have a family history of colorectal cancer, talk with your health care provider about genetic screening.  Your health care provider may also recommend using home test kits to check for hidden blood in your stool.  A small camera at the end of a tube can be used to examine your colon directly (sigmoidoscopy or colonoscopy). This is done to check for the earliest forms of colorectal cancer.  Direct examination of the colon should be repeated every 5-10 years until age 75. However, if early forms of precancerous polyps or small growths are found or if you have a family history or genetic risk for colorectal cancer, you may need to be screened more often.  Skin Cancer  Check your skin from head to toe regularly.  Monitor any moles. Be sure to tell your health care provider: ? About any new moles or changes in moles, especially if there is a change in a mole's shape or color. ? If you have a mole that is larger than the size of a pencil eraser.  If any of your family members has a history of skin cancer, especially at a young age, talk with your health care provider about genetic screening.  Always use sunscreen. Apply sunscreen liberally and repeatedly throughout the day.  Whenever you are outside, protect yourself by wearing long sleeves, pants, a wide-brimmed  hat, and sunglasses.  What should I know about osteoporosis? Osteoporosis is a condition in which bone destruction happens more quickly than new bone creation. After menopause, you may be at an increased risk for osteoporosis. To help prevent osteoporosis or the bone fractures that can happen because of osteoporosis, the following is recommended:  If you are 19-50 years old, get at least 1,000 mg of calcium and at least 600 mg of vitamin D per day.  If you are older than age 50 but younger than age 70, get at least 1,200 mg of calcium and at least 600 mg of vitamin D per day.  If you are older than age 70, get at least 1,200 mg of calcium and at least 800 mg of vitamin D per day.    Smoking and excessive alcohol intake increase the risk of osteoporosis. Eat foods that are rich in calcium and vitamin D, and do weight-bearing exercises several times each week as directed by your health care provider. What should I know about how menopause affects my mental health? Depression may occur at any age, but it is more common as you become older. Common symptoms of depression include:  Low or sad mood.  Changes in sleep patterns.  Changes in appetite or eating patterns.  Feeling an overall lack of motivation or enjoyment of activities that you previously enjoyed.  Frequent crying spells.  Talk with your health care provider if you think that you are experiencing depression. What should I know about immunizations? It is important that you get and maintain your immunizations. These include:  Tetanus, diphtheria, and pertussis (Tdap) booster vaccine.  Influenza every year before the flu season begins.  Pneumonia vaccine.  Shingles vaccine.  Your health care provider may also recommend other immunizations. This information is not intended to replace advice given to you by your health care provider. Make sure you discuss any questions you have with your health care provider. Document Released:  07/14/2005 Document Revised: 12/10/2015 Document Reviewed: 02/23/2015 Elsevier Interactive Patient Education  2018 Elsevier Inc.  

## 2017-12-18 ENCOUNTER — Ambulatory Visit: Payer: Medicare Other | Admitting: Podiatry

## 2018-02-28 ENCOUNTER — Encounter: Payer: Self-pay | Admitting: Internal Medicine

## 2018-02-28 ENCOUNTER — Ambulatory Visit (INDEPENDENT_AMBULATORY_CARE_PROVIDER_SITE_OTHER): Payer: Medicare Other | Admitting: Internal Medicine

## 2018-02-28 VITALS — BP 124/76 | HR 68 | Temp 97.6°F | Resp 16 | Ht 63.0 in | Wt 121.2 lb

## 2018-02-28 DIAGNOSIS — E785 Hyperlipidemia, unspecified: Secondary | ICD-10-CM

## 2018-02-28 DIAGNOSIS — E039 Hypothyroidism, unspecified: Secondary | ICD-10-CM | POA: Diagnosis not present

## 2018-02-28 DIAGNOSIS — M1991 Primary osteoarthritis, unspecified site: Secondary | ICD-10-CM

## 2018-02-28 DIAGNOSIS — M810 Age-related osteoporosis without current pathological fracture: Secondary | ICD-10-CM | POA: Diagnosis not present

## 2018-02-28 DIAGNOSIS — R5383 Other fatigue: Secondary | ICD-10-CM

## 2018-02-28 DIAGNOSIS — Z8639 Personal history of other endocrine, nutritional and metabolic disease: Secondary | ICD-10-CM

## 2018-02-28 LAB — COMPREHENSIVE METABOLIC PANEL
ALBUMIN: 4.1 g/dL (ref 3.5–5.2)
ALK PHOS: 44 U/L (ref 39–117)
ALT: 17 U/L (ref 0–35)
AST: 17 U/L (ref 0–37)
BUN: 18 mg/dL (ref 6–23)
CALCIUM: 9.8 mg/dL (ref 8.4–10.5)
CHLORIDE: 103 meq/L (ref 96–112)
CO2: 29 mEq/L (ref 19–32)
CREATININE: 0.7 mg/dL (ref 0.40–1.20)
GFR: 84.62 mL/min (ref >60.00)
Glucose, Bld: 96 mg/dL (ref 70–99)
POTASSIUM: 4.5 meq/L (ref 3.5–5.1)
Sodium: 140 mEq/L (ref 135–145)
TOTAL PROTEIN: 7.2 g/dL (ref 6.0–8.3)
Total Bilirubin: 0.6 mg/dL (ref 0.2–1.2)

## 2018-02-28 LAB — TSH: TSH: 0.23 u[IU]/mL — ABNORMAL LOW (ref 0.35–4.50)

## 2018-02-28 LAB — LIPID PANEL
CHOL/HDL RATIO: 3
CHOLESTEROL: 215 mg/dL — AB (ref 0–200)
HDL: 77.3 mg/dL (ref >39.00)
LDL CALC: 126 mg/dL — AB (ref 0–99)
NONHDL: 137.52
TRIGLYCERIDES: 59 mg/dL (ref 0.0–149.0)
VLDL: 11.8 mg/dL (ref 0.0–40.0)

## 2018-02-28 LAB — VITAMIN B12: Vitamin B-12: 677 pg/mL (ref 211–911)

## 2018-02-28 NOTE — Assessment & Plan Note (Signed)
Preventive care reviewed. Anxiety depression: On no medications, occasional symptoms. Hypothyroidism: On Synthroid, checking labs Hyperlipidemia: Diet controlled, check a FLP, CMP. Fatigue: As described above, she is concerned about her FH of CHF, ROS negative, had a echo with essentially normal EF back in 2017.  Recommend observation check vitamin D, B12 and B1. Neck DJD: Saw neurosurgery 06-2017, patient reports they agree on conservative treatment unless symptoms increase.  On diazepam as needed, she takes it regularly. Osteoporosis: T score on October 2019 -1.9.  On calcium and vitamin D. Of note : she takes vitamin K, E, A to limit vit supplements to a multivitamin plus  vitamin D. Social: Lives with husband, still very active with her house and yard. RTC 1 year

## 2018-02-28 NOTE — Progress Notes (Signed)
Pre visit review using our clinic review tool, if applicable. No additional management support is needed unless otherwise documented below in the visit note. 

## 2018-02-28 NOTE — Progress Notes (Signed)
Subjective:    Patient ID: Marie Haynes, female    DOB: 04-Feb-1932, 84 y.o.   MRN: [id number]  DOS:  02/28/2018 Type of visit - description : rov Interval history: Hypothyroidism: Good compliance with medication Ongoing leg cramps for years. Reports that she has less energy compared to a couple of years ago, "I think I feel my age". She has a family history of CHF and wonders if her symptoms are heart related. Continue with neck pain, saw neurosurgery.  On diazepam as needed.   Review of Systems  Denies chest pain, DOE. No palpitations or lower extremity edema. No cough or wheezing. Occasionally feels sad, mostly because she is "disappointed" because her grandchildren are not religious.  Past Medical History:  Diagnosis Date  . Anxiety and depression   . DJD (degenerative joint disease)   . Hypothyroidism   . Increased urinary frequency    Dr. Matilde Sprang  . LEG CRAMPS, NOCTURNAL 02/22/2007   Qualifier: Diagnosis of  By: Larose Kells MD, [place] [facility]   . Spinal stenosis   . Stress incontinence    (saw Urology 5-09)    . Symptomatic cholelithiasis 2017   reluctant to have surgery  . Urge and stress incontinence    Dr. Matilde Sprang    Past Surgical History:  Procedure Laterality Date  . BACK SURGERY  [phone]  . DILATION AND CURETTAGE OF UTERUS      Social History   Socioeconomic History  . Marital status: Married    Spouse name: Not on file  . Number of children: 3  . Years of education: Not on file  . Highest education level: Not on file  Occupational History  . Occupation: retired Pensions consultant: RETIRED  Social Needs  . Financial resource strain: Not on file  . Food insecurity:    Worry: Not on file    Inability: Not on file  . Transportation needs:    Medical: Not on file    Non-medical: Not on file  Tobacco Use  . Smoking status: Never Smoker  . Smokeless tobacco: Never Used  Substance and Sexual Activity  . Alcohol use: Yes   Comment: Rare  . Drug use: No  . Sexual activity: Not on file  Lifestyle  . Physical activity:    Days per week: Not on file    Minutes per session: Not on file  . Stress: Not on file  Relationships  . Social connections:    Talks on phone: Not on file    Gets together: Not on file    Attends religious service: Not on file    Active member of club or organization: Not on file    Attends meetings of clubs or organizations: Not on file    Relationship status: Not on file  . Intimate partner violence:    Fear of current or ex partner: Not on file    Emotionally abused: Not on file    Physically abused: Not on file    Forced sexual activity: Not on file  Other Topics Concern  . Not on file  Social History Narrative   Household- pt and husband   Daughter bipolar      Allergies as of 02/28/2018      Reactions   Oxycodone-acetaminophen Anaphylaxis, Nausea And Vomiting   Codeine Other (See Comments)   constipation      Medication List        Accurate as of 02/28/18 10:02  PM. Always use your most recent med list.          CALCIUM + D PO Take 3 tablets by mouth 2 (two) times daily.   cholecalciferol 400 units Tabs tablet Commonly known as:  VITAMIN D Take 4,000 Units by mouth daily.   diazepam 2 MG tablet Commonly known as:  VALIUM Take 2 mg by mouth every 8 (eight) hours as needed for muscle spasms.   fish oil-omega-3 fatty acids 1000 MG capsule Take 1 g by mouth 2 (two) times daily.   GLUCOSAMINE 1500 COMPLEX PO Take 2 tablets by mouth 2 (two) times daily.   levothyroxine 88 MCG tablet Commonly known as:  SYNTHROID, LEVOTHROID Take 1 tablet (88 mcg total) by mouth daily before breakfast.   magnesium 30 MG tablet Take 90 mg by mouth every evening.   RED YEAST RICE PO Take 1 tablet by mouth daily.   vitamin A 7500 UNIT capsule Take 7,500 Units by mouth daily.   vitamin C 500 MG tablet Commonly known as:  ASCORBIC ACID Take 500 mg by mouth 2 (two) times  daily.   vitamin E 400 UNIT capsule Take 400 Units by mouth daily.   vitamin k 100 MCG tablet Take 100 mcg by mouth daily.          Objective:   Physical Exam BP 124/76 (BP Location: Left Arm, Patient Position: Sitting, Cuff Size: Small)   Pulse 68   Temp 97.6 F (36.4 C) (Oral)   Resp 16   Ht 5\' 3"  (1.6 m)   Wt 121 lb 4 oz (55 kg)   SpO2 97%   BMI 21.48 kg/m  General: Well developed, NAD, see BMI.  Neck: No  thyromegaly  HEENT:  Normocephalic . Face symmetric, atraumatic Lungs:  CTA B Normal respiratory effort, no intercostal retractions, no accessory muscle use. Heart: RRR,  no murmur.  No pretibial edema bilaterally  Abdomen:  Not distended, soft, non-tender. No rebound or rigidity.  Palpable, no del [place], no bruit. Skin: Exposed areas without rash. Not pale. Not jaundice Neurologic:  alert & oriented X3.  Speech normal, gait appropriate for age and unassisted Strength symmetric and appropriate for age.  Psych: Cognition and judgment appear intact.  Cooperative with normal attention span and concentration.  Behavior appropriate. No anxious or depressed appearing.     Assessment & Plan:   Assessment Anxiety depression Hypothyroidism Hyperlipidemia, mild TIA 04-2015 --->  Echo 09-2015 negative brain MRI/MRA 05-2015  essentially neg Stress incontinence -- per urology, intol to meds  MSK: --Osteoporosis:  Took Fosamax x 2 years , self DC'd 2014 due to potential for s/e.  T score 2014 -2.7. Declined to take prolia 06-2014 --DJD --Spinal stenosis Severe leg cramps:  gabapentin (dizziness) , tonic water helps some, requip prn Palpable Ao: [place] (-) AAA 2014.  [place] negative for AAA 03-2017  GB stones [place] 05-2015, reluctant to have surgery   PLAN: Preventive care reviewed. Anxiety depression: On no medications, occasional symptoms. Hypothyroidism: On Synthroid, checking labs Hyperlipidemia: Diet controlled, check a FLP, CMP. Fatigue: As described above,  she is concerned about her FH of CHF, ROS negative, had a echo with essentially normal EF back in 2017.  Recommend observation check vitamin D, B12 and B1. Neck DJD: Saw neurosurgery 06-2017, patient reports they agree on conservative treatment unless symptoms increase.  On diazepam as needed, she takes it regularly. Osteoporosis: T score on October 2019 -1.9.  On calcium and vitamin D. Of note : she  takes vitamin K, E, A to limit vit supplements to a multivitamin plus  vitamin D. Social: Lives with husband, still very active with her house and yard. RTC 1 year

## 2018-02-28 NOTE — Assessment & Plan Note (Signed)
-  Td 2012 ;pneumonia shot 1998 and 2011; prevnar 2015; shingles-flu immunizations: not interested, benefits discussed -[facility] Last Colonoscopy:  06/05/1998--actual report not available  Colon bx -- hyperrplastic polyp   Dr Collene Mares [phone], desires no further colonoscopies -Female care:  No further PAPs Breast ca screening:  declined further MMG , no h/o abnormal ones (ok per guidelines)

## 2018-02-28 NOTE — Patient Instructions (Signed)
GO TO THE LAB : Get the blood work     GO TO THE FRONT DESK Schedule your next appointment for a checkup in 1 year  We might ask you to come back for blood work to check your thyroid in few months.

## 2018-03-03 LAB — VITAMIN D 1,25 DIHYDROXY
VITAMIN D3 1, 25 (OH): 26 pg/mL
Vitamin D 1, 25 (OH)2 Total: 26 pg/mL (ref 18–72)

## 2018-03-04 LAB — VITAMIN B1: Vitamin B1 (Thiamine): 12 nmol/L (ref 8–30)

## 2018-03-07 MED ORDER — LEVOTHYROXINE SODIUM 75 MCG PO TABS: 75.0000 ug | ORAL_TABLET | Freq: Every day | ORAL | 3 refills | Status: DC

## 2018-03-07 NOTE — Addendum Note (Signed)
Addended byDamita Dunnings D on: 03/07/2018 02:46 PM   Modules accepted: Orders

## 2018-03-15 ENCOUNTER — Ambulatory Visit (INDEPENDENT_AMBULATORY_CARE_PROVIDER_SITE_OTHER): Payer: Medicare Other | Admitting: Internal Medicine

## 2018-03-15 ENCOUNTER — Emergency Department (HOSPITAL_COMMUNITY)
Admission: EM | Admit: 2018-03-15 | Discharge: 2018-03-15 | Disposition: A | Discharge status: Home / Self Care | Payer: Medicare Other | Attending: Neurosurgery | Admitting: Neurosurgery

## 2018-03-15 ENCOUNTER — Encounter (HOSPITAL_COMMUNITY): Payer: Self-pay | Admitting: *Deleted

## 2018-03-15 ENCOUNTER — Encounter: Payer: Self-pay | Admitting: Internal Medicine

## 2018-03-15 ENCOUNTER — Ambulatory Visit (HOSPITAL_BASED_OUTPATIENT_CLINIC_OR_DEPARTMENT_OTHER)
Admission: RE | Admit: 2018-03-15 | Discharge: 2018-03-15 | Disposition: A | Discharge status: Home / Self Care | Payer: Medicare Other | Source: Ambulatory Visit | Attending: Internal Medicine | Admitting: Internal Medicine

## 2018-03-15 ENCOUNTER — Emergency Department (HOSPITAL_COMMUNITY): Payer: Medicare Other

## 2018-03-15 VITALS — BP 118/64 | HR 64 | Temp 98.0°F | Resp 16 | Ht 63.0 in | Wt 123.5 lb

## 2018-03-15 DIAGNOSIS — G9389 Other specified disorders of brain: Secondary | ICD-10-CM | POA: Diagnosis not present

## 2018-03-15 DIAGNOSIS — Z79899 Other long term (current) drug therapy: Secondary | ICD-10-CM | POA: Insufficient documentation

## 2018-03-15 DIAGNOSIS — E039 Hypothyroidism, unspecified: Secondary | ICD-10-CM | POA: Insufficient documentation

## 2018-03-15 DIAGNOSIS — G4452 New daily persistent headache (NDPH): Secondary | ICD-10-CM | POA: Diagnosis not present

## 2018-03-15 DIAGNOSIS — R51 Headache: Secondary | ICD-10-CM | POA: Diagnosis not present

## 2018-03-15 DIAGNOSIS — G939 Disorder of brain, unspecified: Secondary | ICD-10-CM | POA: Diagnosis not present

## 2018-03-15 LAB — BASIC METABOLIC PANEL
Anion gap: 8 (ref 5–15)
BUN: 12 mg/dL (ref 8–23)
CHLORIDE: 105 mmol/L (ref 98–111)
CO2: 26 mmol/L (ref 22–32)
CREATININE: 0.65 mg/dL (ref 0.44–1.00)
Calcium: 9.1 mg/dL (ref 8.9–10.3)
Glucose, Bld: 100 mg/dL — ABNORMAL HIGH (ref 70–99)
Potassium: 4.2 mmol/L (ref 3.5–5.1)
SODIUM: 139 mmol/L (ref 135–145)

## 2018-03-15 LAB — CBC WITH DIFFERENTIAL/PLATELET
ABS IMMATURE GRANULOCYTES: 0.01 10*3/uL (ref 0.00–0.07)
BASOS ABS: 0 10*3/uL (ref 0.0–0.1)
Basophils Relative: 1 %
EOS PCT: 2 %
Eosinophils Absolute: 0.1 10*3/uL (ref 0.0–0.5)
HCT: 35.9 % — ABNORMAL LOW (ref 36.0–46.0)
HEMOGLOBIN: 11.4 g/dL — AB (ref 12.0–15.0)
Immature Granulocytes: 0 %
LYMPHS PCT: 19 %
Lymphs Abs: 1 10*3/uL (ref 0.7–4.0)
MCH: 30.2 pg (ref 26.0–34.0)
MCHC: 31.8 g/dL (ref 30.0–36.0)
MCV: 95.2 fL (ref 80.0–100.0)
MONO ABS: 0.6 10*3/uL (ref 0.1–1.0)
Monocytes Relative: 12 %
NEUTROS ABS: 3.5 10*3/uL (ref 1.7–7.7)
NRBC: 0 % (ref 0.0–0.2)
Neutrophils Relative %: 66 %
PLATELETS: 218 10*3/uL (ref 150–400)
RBC: 3.77 MIL/uL — AB (ref 3.87–5.11)
RDW: 13.1 % (ref 11.5–15.5)
WBC: 5.2 10*3/uL (ref 4.0–10.5)

## 2018-03-15 LAB — TSH: TSH: 0.37 u[IU]/mL (ref 0.350–4.500)

## 2018-03-15 LAB — SEDIMENTATION RATE: SED RATE: 10 mm/h (ref 0–30)

## 2018-03-15 MED ORDER — GADOBUTROL 1 MMOL/ML IV SOLN
5.5000 mL | Freq: Once | INTRAVENOUS | Status: AC | PRN
  Administered 2018-03-15: 5.5 mL via INTRAVENOUS

## 2018-03-15 MED ORDER — DEXAMETHASONE 2 MG PO TABS: 2.0000 mg | ORAL_TABLET | Freq: Two times a day (BID) | ORAL | 0 refills | Status: AC

## 2018-03-15 MED ORDER — LEVETIRACETAM 500 MG PO TABS: 500.0000 mg | ORAL_TABLET | Freq: Two times a day (BID) | ORAL | 0 refills | Status: DC

## 2018-03-15 MED ORDER — LEVETIRACETAM 500 MG PO TABS
500.0000 mg | ORAL_TABLET | Freq: Once | ORAL | Status: AC
  Administered 2018-03-15: 500 mg via ORAL
  Filled 2018-03-15: qty 1

## 2018-03-15 MED ORDER — DEXAMETHASONE 4 MG PO TABS
2.0000 mg | ORAL_TABLET | Freq: Once | ORAL | Status: AC
  Administered 2018-03-15: 2 mg via ORAL
  Filled 2018-03-15: qty 1

## 2018-03-15 NOTE — ED Triage Notes (Signed)
Pt in c/o headache that she woke up with yesterday around 5a, which is very abnormal for her, relieved yesterday with two advil, today the headache returned again this morning when she woke up, better now, went to her PCP who did a head CT which indicated some swelling- pt sent here for further evaluation

## 2018-03-15 NOTE — Assessment & Plan Note (Addendum)
New onset of HA: R sided, max intensity w/ onset, normal neuro exam. DDX is large. Plan: CT now, sed rate, further advise w/ results Addendum: CT with extensive changes, see below.  I discussed the case with the radiologist and the ER doctor [facility]).  ER accepted to evaluate the patient, most likely she will need to be admitted, seen by neurology etc. Also discussed the situation with the patient and her husband who is going to take her to the ER now. Extensive right temporal lobe vasogenic edema with moderate mass effect and minimal midline shift toward the left. This appearance is not typical of infarct and is more concerning for potential mass or possibly abscess. MRI pre and post-contrast advised to further evaluate. No intracranial hemorrhage evident. No extra-axial fluid collections. There is arterial vascular calcification.

## 2018-03-15 NOTE — ED Provider Notes (Signed)
[facility] DEPART[facility] Provider Note   CSN: [id number] Arrival date & time: 03/15/18  1014     History   Chief Complaint Chief Complaint  Patient presents with  . Headache    HPI Marie Haynes is a 84 y.o. female.  Headache behind the right eye with radiation to the top of the scalp in the occipital area intermittently for 2 days.  No visual field defect, facial asymmetry, motor or sensory deficits.  Patient does not normally get headaches.  She was seen by her primary care doctor (Dr Larose Kells) and a CT scan was performed which revealed vasogenic edema in the right temporal area with a minimal shift.  Patient was sent to the emergency department for further imaging.  Patient is currently symptom-free.  Nothing makes symptoms better or worse.     Past Medical History:  Diagnosis Date  . Anxiety and depression   . DJD (degenerative joint disease)   . Hypothyroidism   . Increased urinary frequency    Dr. Matilde Sprang  . LEG CRAMPS, NOCTURNAL 02/22/2007   Qualifier: Diagnosis of  By: Larose Kells MD, [place] Osteoporosis   . Spinal stenosis   . Stress incontinence    (saw Urology 5-09)    . Symptomatic cholelithiasis 2017   reluctant to have surgery  . Urge and stress incontinence    Dr. Matilde Sprang    Patient Active Problem List   Diagnosis Date Noted  . Follow-up -------------PCP NOTES 02/10/2015  . Annual physical exam 09/04/2012  . Hyperlipidemia, mild 09/03/2012  . Anxiety and depression 06/23/2011  . SKIN LESION 03/25/2010  . Osteoarthritis 12/12/2007  . Hypothyroidism 02/22/2007  . SPINAL STENOSIS 02/22/2007  . LEG CRAMPS, NOCTURNAL 02/22/2007  . Osteoporosis 02/22/2007    Past Surgical History:  Procedure Laterality Date  . BACK SURGERY  [phone]  . DILATION AND CURETTAGE OF UTERUS       OB History   None      Home Medications    Prior to Admission medications   Medication Sig Start Date End Date Taking? Authorizing Provider    Calcium Carbonate-Vitamin D (CALCIUM + D PO) Take 3 tablets by mouth 2 (two) times daily.    Yes [provider]  cholecalciferol (VITAMIN D) 400 UNITS TABS Take 4,000 Units by mouth daily.    Yes [provider]  diazepam (VALIUM) 2 MG tablet Take 2 mg by mouth at bedtime as needed (legs cramps).    Yes [provider]  fish oil-omega-3 fatty acids 1000 MG capsule Take 1 g by mouth 2 (two) times daily.     Yes [provider]  Glucosamine-Chondroit-Vit C-Mn (GLUCOSAMINE 1500 COMPLEX PO) Take 2 tablets by mouth 2 (two) times daily.     Yes [provider]  levothyroxine (SYNTHROID, LEVOTHROID) 75 MCG tablet Take 1 tablet (75 mcg total) by mouth daily before breakfast. 03/07/18  Yes Paz, Alda Berthold, MD  magnesium 30 MG tablet Take 90 mg by mouth every evening.   Yes [provider]  Red Yeast Rice Extract (RED YEAST RICE PO) Take 1 tablet by mouth daily.   Yes [provider]  vitamin A 7500 UNIT capsule Take 7,500 Units by mouth daily.   Yes [provider]  vitamin C (ASCORBIC ACID) 500 MG tablet Take 500 mg by mouth 2 (two) times daily.    Yes [provider]  vitamin E 400 UNIT capsule Take 400 Units by mouth every other  day.    Yes [provider]  vitamin k 100 MCG tablet Take 100 mcg by mouth daily.     Yes [provider]    Family History Family History  Problem Relation Age of Onset  . Heart failure Mother   . Heart attack Mother 64       dx in her 64s  . Colon cancer Neg Hx   . Breast cancer Neg Hx   . Diabetes Neg Hx     Social History Social History   Tobacco Use  . Smoking status: Never Smoker  . Smokeless tobacco: Never Used  Substance Use Topics  . Alcohol use: Yes    Comment: Rare  . Drug use: No     Allergies   Oxycodone-acetaminophen and Codeine   Review of Systems Review of Systems  All other systems reviewed and are negative.    Physical Exam Updated  Vital Signs BP 124/67   Pulse 61   Temp 98 F (36.7 C) (Oral)   Resp 18   SpO2 100%   Physical Exam  Constitutional: She is oriented to person, place, and time. She appears well-developed and well-nourished.  HENT:  Head: Normocephalic and atraumatic.  Eyes: Conjunctivae are normal.  Neck: Neck supple.  Cardiovascular: Normal rate and regular rhythm.  Pulmonary/Chest: Effort normal and breath sounds normal.  Abdominal: Soft. Bowel sounds are normal.  Musculoskeletal: Normal range of motion.  Neurological: She is alert and oriented to person, place, and time.  Skin: Skin is warm and dry.  Psychiatric: She has a normal mood and affect. Her behavior is normal.  Nursing note and vitals reviewed.    ED Treatments / Results  Labs (all labs ordered are listed, but only abnormal results are displayed) Labs Reviewed  CBC WITH DIFFERENTIAL/PLATELET - Abnormal; Notable for the following components:      Result Value   RBC 3.77 (*)    Hemoglobin 11.4 (*)    HCT 35.9 (*)    All other components within normal limits  BASIC METABOLIC PANEL - Abnormal; Notable for the following components:   Glucose, Bld 100 (*)    All other components within normal limits  TSH    EKG None  Radiology Ct Head Wo Contrast  Result Date: 03/15/2018 CLINICAL DATA:  Headache for several days EXAM: CT HEAD WITHOUT CONTRAST TECHNIQUE: Contiguous axial images were obtained from the base of the skull through the vertex without intravenous contrast. COMPARISON:  None. FINDINGS: Brain: There is extensive vasogenic edema seen throughout much of the right temporal lobe with extension to the junctions of the posterior right frontal and anterior right temporal lobe superiorly and posteriorly to the right temporal occipital junction. There is loss of sulcal effacement in this area. There is slight impression on the right lateral ventricle with approximately 2 mm of midline shift toward the left. Ventricles overall are  not enlarged. There is minimal midline shift to the left involving the third ventricle. The fourth ventricle is midline. There is no well-defined mass or hemorrhage. There are no subdural or epidural fluid collections. Note that the cisterna magna is prominent, a presumed anatomic variant. Vascular: No vascular lesions evident. There is edema impressing upon the posterior aspect of the right middle cerebral artery. There are foci of calcification in the carotid siphon regions bilaterally. Skull: The bony calvarium appears intact. Sinuses/Orbits: Visualized paranasal sinuses are clear. Patient has had cataract removals bilaterally. Orbits appear symmetric bilaterally. Other: Mastoid air cells are clear. IMPRESSION:  Extensive right temporal lobe vasogenic edema with moderate mass effect and minimal midline shift toward the left. This appearance is not typical of infarct and is more concerning for potential mass or possibly abscess. MRI pre and post-contrast advised to further evaluate. No intracranial hemorrhage evident. No extra-axial fluid collections. There is arterial vascular calcification. These results were called by telephone at the time of interpretation on 03/15/2018 at 9:21 am to Dr. Kathlene November , who verbally acknowledged these results. Electronically Signed   By: Lowella Grip III M.D.   On: 03/15/2018 09:21   Mr [name] And Wo Contrast  Result Date: 03/15/2018 CLINICAL DATA:  84 year old female with abnormal right cerebral hemisphere on head CT this morning for headache. Vasogenic edema suspected. EXAM: MRI HEAD WITHOUT AND WITH CONTRAST TECHNIQUE: Multiplanar, multiecho pulse sequences of the brain and surrounding structures were obtained without and with intravenous contrast. CONTRAST:  5.5 milliliters Gadavist COMPARISON:  Head CT without contrast 0903 hours. Brain MRI 05/17/2015. FINDINGS: Brain: There is a large intra-axial heterogeneously enhancing mass in the right temporal lobe, lobulated  with thick irregular peripheral enhancement and areas of central necrosis or cystic change. The lesion is 47 x 44 x 50 millimeters (AP by transverse by CC). The enhancing component extends to the cortical surface of the right temporal tip and is inseparable from the overlying dura which appears mildly thickened (series 14, image 67). The underlying parenchyma is heterogeneously T2 and FLAIR hyperintense. Confluent regional T2 and FLAIR hyperintensity does resemble vasogenic edema in some areas. The nonenhancing components of the mass are not restricted on diffusion, but diffusion is heterogeneous within the lesion. There is some hemosiderin/petechial hemorrhage within the mass. This lesion was not present in 2016. No other enhancing brain lesion. Mass effect results in partial effacement of the right lateral ventricle with leftward midline shift of 5-6 millimeters. The the right temporal horn is displaced medially and the right ambient cistern is partially effaced, but other basilar cisterns remain normal. No ventriculomegaly. No ependymal enhancement. Elsewhere gray and white matter signal is stable since the 2016 MRI. No restricted diffusion to suggest acute infarction. No extra-axial collection or acute intracranial hemorrhage. Cervicomedullary junction and pituitary are within normal limits. Vascular: Major intracranial vascular flow voids are stable since 2016. There is mass effect on the right MCA branches. Skull and upper cervical spine: Negative visible cervical spine and spinal cord. Visualized bone marrow signal is within normal limits. Sinuses/Orbits: Stable and negative. Other: Mastoids remain clear. Visible internal auditory structures appear normal. Scalp and face soft tissues appear negative. IMPRESSION: 1. Solitary large 5 cm heterogeneously enhancing, partially necrotic intra-axial mass in the right temporal lobe. The mass extends to the surface of the anterior temporal tip and is inseparable from  the overlying dura which appears mildly thickened. Regional T2 and FLAIR hyperintensity tracking in the white matter could be vasogenic edema or nonenhancing tumor. Regional mass effect including effaced and medially displaced right temporal horn, mild leftward midline shift of 6 millimeters. 2. Burtis Junes a primary tumor such as Glioblastoma over a solitary metastasis. CNS lymphoma is also less likely. Electronically Signed   By: Genevie Ann M.D.   On: 03/15/2018 12:59    Procedures Procedures (including critical care time)  Medications Ordered in ED Medications  gadobutrol (GADAVIST) 1 MMOL/ML injection 5.5 mL (5.5 mLs Intravenous Contrast Given 03/15/18 1229)     Initial Impression / Assessment and Plan / ED Course  I have reviewed the triage vital signs and the  nursing notes.  Pertinent labs & imaging results that were available during my care of the patient were reviewed by me and considered in my medical decision making (see chart for details).     Patient presents with right temporal headache.  MRI scan reveals a worrisome mass in the right temporal area.  Patient is currently symptom-free.  Discussed clinical scenario with neurosurgeon on-call Dr. Christella Noa.  He will consult.   CRITICAL CARE Performed by: Nat Christen Total critical care time: 30 minutes Critical care time was exclusive of separately billable procedures and treating other patients. Critical care was necessary to treat or prevent imminent or life-threatening deterioration. Critical care was time spent personally by me on the following activities: development of treatment plan with patient and/or surrogate as well as nursing, discussions with consultants, evaluation of patient's response to treatment, examination of patient, obtaining history from patient or surrogate, ordering and performing treatments and interventions, ordering and review of laboratory studies, ordering and review of radiographic studies, pulse oximetry and  re-evaluation of patient's condition.  Final Clinical Impressions(s) / ED Diagnoses   Final diagnoses:  Brain mass    ED Discharge Orders    None       Nat Christen, MD 03/15/18 1452

## 2018-03-15 NOTE — Patient Instructions (Addendum)
Please go to [facility] ER , I already talk with the doctor there

## 2018-03-15 NOTE — ED Notes (Signed)
Pt transported to MRI via stretcher.  

## 2018-03-15 NOTE — ED Notes (Signed)
Repaged Dr. Cyndy Freeze per MD

## 2018-03-15 NOTE — ED Notes (Signed)
Pt returned from MRI, tolerated well.

## 2018-03-15 NOTE — Progress Notes (Signed)
Pre visit review using our clinic review tool, if applicable. No additional management support is needed unless otherwise documented below in the visit note. 

## 2018-03-15 NOTE — Progress Notes (Signed)
Subjective:    Patient ID: Marie Haynes, female    DOB: 01-03-1932, 84 y.o.   MRN: [id number]  DOS:  03/15/2018 Type of visit - description : Acute visit Interval history: Yesterday, at 5 AM she was woke up by a headache.  It started behind the right eye and then went to the whole right side of the head and neck. She had breakfast and took an Advil.  After that the headache gradually dissipate in a matter of 3 hours. Today again, was woke up with a headache at 2:30 in the morning.  It is now 8.30 and the pain has decreased gradually. It was not associated with nausea, vomiting, light or noise intolerance. She has no history of previous major problem with headaches.  Review of Systems Denies fever, chills. No sinus congestion. Right eye was no watery. No visual disturbances. No fall, injury.  Has chronic neck pain, at baseline. No amaurosis fugax specifically.  No jaw claudication.   Past Medical History:  Diagnosis Date  . Anxiety and depression   . DJD (degenerative joint disease)   . Hypothyroidism   . Increased urinary frequency    Dr. Matilde Sprang  . LEG CRAMPS, NOCTURNAL 02/22/2007   Qualifier: Diagnosis of  By: Larose Kells MD, [place] Osteoporosis   . Spinal stenosis   . Stress incontinence    (saw Urology 5-09)    . Symptomatic cholelithiasis 2017   reluctant to have surgery  . Urge and stress incontinence    Dr. Matilde Sprang    Past Surgical History:  Procedure Laterality Date  . BACK SURGERY  [phone]  . DILATION AND CURETTAGE OF UTERUS      Social History   Socioeconomic History  . Marital status: Married    Spouse name: Not on file  . Number of children: 3  . Years of education: Not on file  . Highest education level: Not on file  Occupational History  . Occupation: retired Pensions consultant: RETIRED  Social Needs  . Financial resource strain: Not on file  . Food insecurity:    Worry: Not on file    Inability: Not on file  .  Transportation needs:    Medical: Not on file    Non-medical: Not on file  Tobacco Use  . Smoking status: Never Smoker  . Smokeless tobacco: Never Used  Substance and Sexual Activity  . Alcohol use: Yes    Comment: Rare  . Drug use: No  . Sexual activity: Not on file  Lifestyle  . Physical activity:    Days per week: Not on file    Minutes per session: Not on file  . Stress: Not on file  Relationships  . Social connections:    Talks on phone: Not on file    Gets together: Not on file    Attends religious service: Not on file    Active member of club or organization: Not on file    Attends meetings of clubs or organizations: Not on file    Relationship status: Not on file  . Intimate partner violence:    Fear of current or ex partner: Not on file    Emotionally abused: Not on file    Physically abused: Not on file    Forced sexual activity: Not on file  Other Topics Concern  . Not on file  Social History Narrative   Household- pt and husband   Daughter bipolar  Allergies as of 03/15/2018      Reactions   Oxycodone-acetaminophen Anaphylaxis, Nausea And Vomiting   Codeine Other (See Comments)   constipation      Medication List        Accurate as of 03/15/18 12:43 PM. Always use your most recent med list.          CALCIUM + D PO Take 3 tablets by mouth 2 (two) times daily.   cholecalciferol 400 units Tabs tablet Commonly known as:  VITAMIN D Take 4,000 Units by mouth daily.   diazepam 2 MG tablet Commonly known as:  VALIUM Take 2 mg by mouth at bedtime as needed (legs cramps).   fish oil-omega-3 fatty acids 1000 MG capsule Take 1 g by mouth 2 (two) times daily.   GLUCOSAMINE 1500 COMPLEX PO Take 2 tablets by mouth 2 (two) times daily.   levothyroxine 75 MCG tablet Commonly known as:  SYNTHROID, LEVOTHROID Take 1 tablet (75 mcg total) by mouth daily before breakfast.   magnesium 30 MG tablet Take 90 mg by mouth every evening.   RED YEAST  RICE PO Take 1 tablet by mouth daily.   vitamin A 7500 UNIT capsule Take 7,500 Units by mouth daily.   vitamin C 500 MG tablet Commonly known as:  ASCORBIC ACID Take 500 mg by mouth 2 (two) times daily.   vitamin E 400 UNIT capsule Take 400 Units by mouth every other day.   vitamin k 100 MCG tablet Take 100 mcg by mouth daily.          Objective:   Physical Exam BP 118/64 (BP Location: Left Arm, Patient Position: Sitting, Cuff Size: Small)   Pulse 64   Temp 98 F (36.7 C) (Oral)   Resp 16   Ht 5\' 3"  (1.6 m)   Wt 123 lb 8 oz (56 kg)   SpO2 97%   BMI 21.88 kg/m  General:   Well developed, NAD, see BMI.  HEENT:  Normocephalic . Face symmetric, atraumatic.  Nose not congested, sinuses no TTP.  Temples: Nontender, normal TA pulses Neck: Normal carotid pulses Lungs:  CTA B Normal respiratory effort, no intercostal retractions, no accessory muscle use. Heart: RRR,  no murmur.  No pretibial edema bilaterally  Skin: Not pale. Not jaundice Neurologic:  alert & oriented X3.  Speech normal, gait appropriate for age and unassisted EOMI, pupils equal and reactive. Motor-DTRs symmetric  Psych--  Cognition and judgment appear intact.  Cooperative with normal attention span and concentration.  Behavior appropriate. No anxious or depressed appearing.      Assessment & Plan:   Assessment Anxiety depression Hypothyroidism Hyperlipidemia, mild TIA 04-2015 --->  Echo 09-2015 negative brain MRI/MRA 05-2015  essentially neg Stress incontinence -- per urology, intol to meds  MSK: --Osteoporosis:  Took Fosamax x 2 years , self DC'd 2014 due to potential for s/e.  T score 2014 -2.7. Declined to take prolia 06-2014 --DJD --Spinal stenosis Severe leg cramps:  gabapentin (dizziness) , tonic water helps some, requip prn Palpable Ao: [place] (-) AAA 2014.  [place] negative for AAA 03-2017  GB stones [place] 05-2015, reluctant to have surgery   PLAN: New onset of HA: R sided, max intensity  w/ onset, normal neuro exam. DDX is large. Plan: CT now, sed rate, further advise w/ results Addendum: CT with extensive changes, see below.  I discussed the case with the radiologist and the ER doctor [facility]).  ER accepted to evaluate the patient, most likely she will  need to be admitted, seen by neurology etc. Also discussed the situation with the patient and her husband who is going to take her to the ER now. Extensive right temporal lobe vasogenic edema with moderate mass effect and minimal midline shift toward the left. This appearance is not typical of infarct and is more concerning for potential mass or possibly abscess. MRI pre and post-contrast advised to further evaluate. No intracranial hemorrhage evident. No extra-axial fluid collections. There is arterial vascular calcification.

## 2018-03-15 NOTE — Consult Note (Addendum)
Reason for Consult:cerebral mass, incidental finding Referring Physician: Ronald Pippins Marie Haynes is an 84 y.o. female.  HPI: whom complained of a headache and was ordered a Head CT. This scan revealed a likely mass in the right anterior temporal lobe. Subsequent MRI revealed an inhomogenously enhancing mass in the right temporal lobe.   Past Medical History:  Diagnosis Date  . Anxiety and depression   . DJD (degenerative joint disease)   . Hypothyroidism   . Increased urinary frequency    Dr. Matilde Sprang  . LEG CRAMPS, NOCTURNAL 02/22/2007   Qualifier: Diagnosis of  By: Larose Kells MD, Marie Haynes   . Spinal stenosis   . Stress incontinence    (saw Urology 5-09)    . Symptomatic cholelithiasis 2017   reluctant to have surgery  . Urge and stress incontinence    Dr. Matilde Sprang    Past Surgical History:  Procedure Laterality Date  . BACK SURGERY  [phone]  . DILATION AND CURETTAGE OF UTERUS      Family History  Problem Relation Age of Onset  . Heart failure Mother   . Heart attack Mother 58       dx in her 22s  . Colon cancer Neg Hx   . Breast cancer Neg Hx   . Diabetes Neg Hx     Social History:  reports that she has never smoked. She has never used smokeless tobacco. She reports that she drinks alcohol. She reports that she does not use drugs.  Allergies:  Allergies  Allergen Reactions  . Oxycodone-Acetaminophen Anaphylaxis and Nausea And Vomiting  . Codeine Other (See Comments)    constipation    Medications: I have reviewed the patient's current medications.  Results for orders placed or performed during the hospital encounter of 03/15/18 (from the past 48 hour(s))  CBC with Differential     Status: Abnormal   Collection Time: 03/15/18 11:15 AM  Result Value Ref Range   WBC 5.2 4.0 - 10.5 K/uL   RBC 3.77 (L) 3.87 - 5.11 MIL/uL   Hemoglobin 11.4 (L) 12.0 - 15.0 g/dL   HCT 35.9 (L) 36.0 - 46.0 %   MCV 95.2 80.0 - 100.0 fL   MCH 30.2 26.0 - 34.0 pg    MCHC 31.8 30.0 - 36.0 g/dL   RDW 13.1 11.5 - 15.5 %   Platelets 218 150 - 400 K/uL   nRBC 0.0 0.0 - 0.2 %   Neutrophils Relative % 66 %   Neutro Abs 3.5 1.7 - 7.7 K/uL   Lymphocytes Relative 19 %   Lymphs Abs 1.0 0.7 - 4.0 K/uL   Monocytes Relative 12 %   Monocytes Absolute 0.6 0.1 - 1.0 K/uL   Eosinophils Relative 2 %   Eosinophils Absolute 0.1 0.0 - 0.5 K/uL   Basophils Relative 1 %   Basophils Absolute 0.0 0.0 - 0.1 K/uL   Immature Granulocytes 0 %   Abs Immature Granulocytes 0.01 0.00 - 0.07 K/uL    Comment: Performed at [facility], [address] [place], [place] [address]  Basic metabolic panel     Status: Abnormal   Collection Time: 03/15/18 11:15 AM  Result Value Ref Range   Sodium 139 135 - 145 mmol/L   Potassium 4.2 3.5 - 5.1 mmol/L   Chloride 105 98 - 111 mmol/L   CO2 26 22 - 32 mmol/L   Glucose, Bld 100 (H) 70 - 99 mg/dL   BUN 12 8 -  23 mg/dL   Creatinine, Ser 0.65 0.44 - 1.00 mg/dL   Calcium 9.1 8.9 - 10.3 mg/dL   GFR calc non Af Amer >60 >60 mL/min   GFR calc Af Amer >60 >60 mL/min    Comment: (NOTE) The eGFR has been calculated using the CKD EPI equation. This calculation has not been validated in all clinical situations. eGFR's persistently <60 mL/min signify possible Chronic Kidney Disease.    Anion gap 8 5 - 15    Comment: Performed at [facility] [address], [place], [place] [address]  TSH     Status: None   Collection Time: 03/15/18 11:16 AM  Result Value Ref Range   TSH 0.370 0.350 - 4.500 uIU/mL    Comment: Performed by a 3rd Generation assay with a functional sensitivity of <=0.01 uIU/mL. Performed at [facility], [address], [place],  [address]     Ct Head Wo Contrast  Result Date: 03/15/2018 CLINICAL DATA:  Headache for several days EXAM: CT HEAD WITHOUT CONTRAST TECHNIQUE: Contiguous axial images were obtained from the base of the skull through the vertex without intravenous contrast. COMPARISON:   None. FINDINGS: Brain: There is extensive vasogenic edema seen throughout much of the right temporal lobe with extension to the junctions of the posterior right frontal and anterior right temporal lobe superiorly and posteriorly to the right temporal occipital junction. There is loss of sulcal effacement in this area. There is slight impression on the right lateral ventricle with approximately 2 mm of midline shift toward the left. Ventricles overall are not enlarged. There is minimal midline shift to the left involving the third ventricle. The fourth ventricle is midline. There is no well-defined mass or hemorrhage. There are no subdural or epidural fluid collections. Note that the cisterna magna is prominent, a presumed anatomic variant. Vascular: No vascular lesions evident. There is edema impressing upon the posterior aspect of the right middle cerebral artery. There are foci of calcification in the carotid siphon regions bilaterally. Skull: The bony calvarium appears intact. Sinuses/Orbits: Visualized paranasal sinuses are clear. Patient has had cataract removals bilaterally. Orbits appear symmetric bilaterally. Other: Mastoid air cells are clear. IMPRESSION: Extensive right temporal lobe vasogenic edema with moderate mass effect and minimal midline shift toward the left. This appearance is not typical of infarct and is more concerning for potential mass or possibly abscess. MRI pre and post-contrast advised to further evaluate. No intracranial hemorrhage evident. No extra-axial fluid collections. There is arterial vascular calcification. These results were called by telephone at the time of interpretation on 03/15/2018 at 9:21 am to Dr. Kathlene November , who verbally acknowledged these results. Electronically Signed   By: Lowella Grip III M.D.   On: 03/15/2018 09:21   Mr Marie Haynes And Wo Contrast  Result Date: 03/15/2018 CLINICAL DATA:  84 year old female with abnormal right cerebral hemisphere on head CT  this morning for headache. Vasogenic edema suspected. EXAM: MRI HEAD WITHOUT AND WITH CONTRAST TECHNIQUE: Multiplanar, multiecho pulse sequences of the brain and surrounding structures were obtained without and with intravenous contrast. CONTRAST:  5.5 milliliters Gadavist COMPARISON:  Head CT without contrast 0903 hours. Brain MRI 05/17/2015. FINDINGS: Brain: There is a large intra-axial heterogeneously enhancing mass in the right temporal lobe, lobulated with thick irregular peripheral enhancement and areas of central necrosis or cystic change. The lesion is 47 x 44 x 50 millimeters (AP by transverse by CC). The enhancing component extends to the cortical surface of the right  temporal tip and is inseparable from the overlying dura which appears mildly thickened (series 14, image 67). The underlying parenchyma is heterogeneously T2 and FLAIR hyperintense. Confluent regional T2 and FLAIR hyperintensity does resemble vasogenic edema in some areas. The nonenhancing components of the mass are not restricted on diffusion, but diffusion is heterogeneous within the lesion. There is some hemosiderin/petechial hemorrhage within the mass. This lesion was not present in 2016. No other enhancing brain lesion. Mass effect results in partial effacement of the right lateral ventricle with leftward midline shift of 5-6 millimeters. The the right temporal horn is displaced medially and the right ambient cistern is partially effaced, but other basilar cisterns remain normal. No ventriculomegaly. No ependymal enhancement. Elsewhere gray and white matter signal is stable since the 2016 MRI. No restricted diffusion to suggest acute infarction. No extra-axial collection or acute intracranial hemorrhage. Cervicomedullary junction and pituitary are within normal limits. Vascular: Major intracranial vascular flow voids are stable since 2016. There is mass effect on the right MCA branches. Skull and upper cervical spine: Negative visible  cervical spine and spinal cord. Visualized bone marrow signal is within normal limits. Sinuses/Orbits: Stable and negative. Other: Mastoids remain clear. Visible internal auditory structures appear normal. Scalp and face soft tissues appear negative. IMPRESSION: 1. Solitary large 5 cm heterogeneously enhancing, partially necrotic intra-axial mass in the right temporal lobe. The mass extends to the surface of the anterior temporal tip and is inseparable from the overlying dura which appears mildly thickened. Regional T2 and FLAIR hyperintensity tracking in the white matter could be vasogenic edema or nonenhancing tumor. Regional mass effect including effaced and medially displaced right temporal horn, mild leftward midline shift of 6 millimeters. 2. Burtis Junes a primary tumor such as Glioblastoma over a solitary metastasis. CNS lymphoma is also less likely. Electronically Signed   By: Genevie Ann M.D.   On: 03/15/2018 12:59    Review of Systems  Constitutional: Negative.   HENT: Negative.   Eyes: Negative.   Respiratory: Negative.   Cardiovascular: Negative.   Gastrointestinal: Negative.   Genitourinary: Negative.   Musculoskeletal: Negative.   Skin: Negative.   Neurological: Positive for headaches.  Endo/Heme/Allergies: Negative.   Psychiatric/Behavioral: Negative.    Blood pressure (!) 109/55, pulse 72, temperature 98 F (36.7 C), temperature source Oral, resp. rate 18, SpO2 98 %. Physical Exam  Constitutional: She is oriented to person, place, and time. She appears well-developed and well-nourished. No distress.  HENT:  Head: Normocephalic and atraumatic.  Mouth/Throat: Oropharynx is clear and moist.  Eyes: Pupils are equal, round, and reactive to light. Conjunctivae and EOM are normal.  Neck: Normal range of motion. Neck supple.  Cardiovascular: Normal rate, regular rhythm, normal heart sounds and intact distal pulses.  Respiratory: Effort normal and breath sounds normal.  GI: Soft.   Musculoskeletal: Normal range of motion.  Neurological: She is alert and oriented to person, place, and time. She has normal reflexes. She displays normal reflexes. No cranial nerve deficit. She exhibits normal muscle tone. Coordination normal.  Skin: Skin is warm and dry.  Psychiatric: She has a normal mood and affect. Her behavior is normal. Judgment and thought content normal.    Assessment/Plan: Marie Haynes is a 84 y.o. 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 She would like to proceed with a craniotomy for resection. She will go home as she is asymptomatic currently. She will receive decadron and keppra. I will plan on surgery next week.  Kyro Joswick L 03/15/2018, 4:00 PM   There is no change in the physical exam. We plan on the temporal craniotomy for tumor resection. She understands the risks and benefits including but not limited to stroke, coma, death, change in personality, inability to resect the mass, brain damage, bleeding, infection, need for further surgery

## 2018-03-15 NOTE — ED Provider Notes (Signed)
Assumed care from Dr. Lacinda Axon at 4 PM.  Patient with MRI today that showed new mass.  Dr. Christella Noa with neurosurgery has seen the patient and has scheduled her for surgery on Monday.  He states that she is stable for discharge at this time.  Recommends prescription for Keppra and Decadron.  Given return precautions and discharged from in ED in good condition.   Lennice Sites, DO 03/15/18 1640

## 2018-03-19 ENCOUNTER — Other Ambulatory Visit: Payer: Self-pay | Admitting: Neurosurgery

## 2018-03-19 ENCOUNTER — Encounter (HOSPITAL_COMMUNITY): Payer: Self-pay | Admitting: *Deleted

## 2018-03-19 ENCOUNTER — Other Ambulatory Visit: Payer: Self-pay

## 2018-03-19 NOTE — Patient Instructions (Signed)
Mailbox full

## 2018-03-19 NOTE — Anesthesia Preprocedure Evaluation (Addendum)
Anesthesia Evaluation  Patient identified by MRN, date of birth, ID band Patient awake    Reviewed: Allergy & Precautions, NPO status , Patient's Chart, lab work & pertinent test results  History of Anesthesia Complications Negative for: history of anesthetic complications  Airway Mallampati: II  TM Distance: >3 FB Neck ROM: Full    Dental no notable dental hx. (+) Dental Advisory Given   Pulmonary neg pulmonary ROS,    Pulmonary exam normal        Cardiovascular negative cardio ROS Normal cardiovascular exam     Neuro/Psych  Headaches, PSYCHIATRIC DISORDERS Anxiety Depression    GI/Hepatic negative GI ROS, Neg liver ROS,   Endo/Other  Hypothyroidism   Renal/GU negative Renal ROS     Musculoskeletal negative musculoskeletal ROS (+)   Abdominal   Peds  Hematology negative hematology ROS (+)   Anesthesia Other Findings Day of surgery medications reviewed with the patient.  Reproductive/Obstetrics                            Anesthesia Physical Anesthesia Plan  ASA: III  Anesthesia Plan: General   Post-op Pain Management:    Induction: Intravenous  PONV Risk Score and Plan: 3 and Ondansetron, Dexamethasone and Treatment may vary due to age or medical condition  Airway Management Planned: Oral ETT  Additional Equipment: Arterial line, CVP and Ultrasound Guidance Line Placement  Intra-op Plan:   Post-operative Plan: Possible Post-op intubation/ventilation  Informed Consent: I have reviewed the patients History and Physical, chart, labs and discussed the procedure including the risks, benefits and alternatives for the proposed anesthesia with the patient or authorized representative who has indicated his/her understanding and acceptance.   Dental advisory given  Plan Discussed with: CRNA, Anesthesiologist and Surgeon  Anesthesia Plan Comments:        Anesthesia Quick  Evaluation

## 2018-03-20 ENCOUNTER — Inpatient Hospital Stay (HOSPITAL_COMMUNITY)
Admission: RE | Admit: 2018-03-20 | Discharge: 2018-03-23 | DRG: 027 | Disposition: A | Discharge status: Home / Self Care | Payer: Medicare Other | Source: Ambulatory Visit | Attending: Neurosurgery | Admitting: Neurosurgery

## 2018-03-20 ENCOUNTER — Inpatient Hospital Stay (HOSPITAL_COMMUNITY): Payer: Medicare Other

## 2018-03-20 ENCOUNTER — Encounter (HOSPITAL_COMMUNITY): Payer: Self-pay

## 2018-03-20 ENCOUNTER — Other Ambulatory Visit: Payer: Self-pay | Admitting: Neurosurgery

## 2018-03-20 ENCOUNTER — Encounter (HOSPITAL_COMMUNITY)
Admission: RE | Disposition: A | Discharge status: Home / Self Care | Payer: Self-pay | Source: Ambulatory Visit | Attending: Neurosurgery

## 2018-03-20 DIAGNOSIS — C719 Malignant neoplasm of brain, unspecified: Secondary | ICD-10-CM | POA: Diagnosis present

## 2018-03-20 DIAGNOSIS — C712 Malignant neoplasm of temporal lobe: Secondary | ICD-10-CM | POA: Diagnosis not present

## 2018-03-20 DIAGNOSIS — D496 Neoplasm of unspecified behavior of brain: Secondary | ICD-10-CM | POA: Diagnosis present

## 2018-03-20 DIAGNOSIS — E039 Hypothyroidism, unspecified: Secondary | ICD-10-CM | POA: Diagnosis not present

## 2018-03-20 DIAGNOSIS — E785 Hyperlipidemia, unspecified: Secondary | ICD-10-CM | POA: Diagnosis not present

## 2018-03-20 DIAGNOSIS — G9389 Other specified disorders of brain: Secondary | ICD-10-CM | POA: Diagnosis not present

## 2018-03-20 HISTORY — PX: APPLICATION OF CRANIAL NAVIGATION: SHX6578

## 2018-03-20 HISTORY — DX: Constipation, unspecified: K59.00

## 2018-03-20 HISTORY — DX: Family history of other specified conditions: Z84.89

## 2018-03-20 HISTORY — PX: CRANIOTOMY: SHX93

## 2018-03-20 LAB — BASIC METABOLIC PANEL
ANION GAP: 8 (ref 5–15)
BUN: 14 mg/dL (ref 8–23)
CHLORIDE: 109 mmol/L (ref 98–111)
CO2: 23 mmol/L (ref 22–32)
Calcium: 9.1 mg/dL (ref 8.9–10.3)
Creatinine, Ser: 0.72 mg/dL (ref 0.44–1.00)
GFR calc Af Amer: >60 mL/min (ref >60)
GLUCOSE: 99 mg/dL (ref 70–99)
POTASSIUM: 3.7 mmol/L (ref 3.5–5.1)
Sodium: 140 mmol/L (ref 135–145)

## 2018-03-20 LAB — CBC
HCT: 38.8 % (ref 36.0–46.0)
HEMOGLOBIN: 11.7 g/dL — AB (ref 12.0–15.0)
MCH: 29 pg (ref 26.0–34.0)
MCHC: 30.2 g/dL (ref 30.0–36.0)
MCV: 96.3 fL (ref 80.0–100.0)
NRBC: 0 % (ref 0.0–0.2)
PLATELETS: 254 10*3/uL (ref 150–400)
RBC: 4.03 MIL/uL (ref 3.87–5.11)
RDW: 13.2 % (ref 11.5–15.5)
WBC: 6.6 10*3/uL (ref 4.0–10.5)

## 2018-03-20 LAB — TYPE AND SCREEN
ABO/RH(D): O POS
Antibody Screen: NEGATIVE

## 2018-03-20 LAB — MRSA PCR SCREENING: MRSA BY PCR: NEGATIVE

## 2018-03-20 LAB — ABO/RH: ABO/RH(D): O POS

## 2018-03-20 SURGERY — CRANIOTOMY TUMOR EXCISION
Anesthesia: General | Site: Head | Laterality: Right

## 2018-03-20 MED ORDER — MIDAZOLAM HCL 2 MG/2ML IJ SOLN
INTRAMUSCULAR | Status: AC
  Filled 2018-03-20: qty 2

## 2018-03-20 MED ORDER — LIDOCAINE 2% (20 MG/ML) 5 ML SYRINGE
INTRAMUSCULAR | Status: AC
  Filled 2018-03-20: qty 5

## 2018-03-20 MED ORDER — FENTANYL CITRATE (PF) 250 MCG/5ML IJ SOLN
INTRAMUSCULAR | Status: AC
  Filled 2018-03-20: qty 5

## 2018-03-20 MED ORDER — PHENYLEPHRINE 40 MCG/ML (10ML) SYRINGE FOR IV PUSH (FOR BLOOD PRESSURE SUPPORT)
PREFILLED_SYRINGE | INTRAVENOUS | Status: DC | PRN
  Administered 2018-03-20 (×3): 80 ug via INTRAVENOUS

## 2018-03-20 MED ORDER — PHENYLEPHRINE 40 MCG/ML (10ML) SYRINGE FOR IV PUSH (FOR BLOOD PRESSURE SUPPORT)
PREFILLED_SYRINGE | INTRAVENOUS | Status: AC
  Filled 2018-03-20: qty 10

## 2018-03-20 MED ORDER — ALBUMIN HUMAN 5 % IV SOLN
INTRAVENOUS | Status: DC | PRN
  Administered 2018-03-20: 12:00:00 via INTRAVENOUS

## 2018-03-20 MED ORDER — NALOXONE HCL 0.4 MG/ML IJ SOLN: 0.0800 mg | INTRAMUSCULAR | Status: DC | PRN

## 2018-03-20 MED ORDER — ONDANSETRON HCL 4 MG PO TABS: 4.0000 mg | ORAL_TABLET | ORAL | Status: DC | PRN

## 2018-03-20 MED ORDER — VITAMIN E 180 MG (400 UNIT) PO CAPS: 400.0000 [IU] | ORAL_CAPSULE | ORAL | Status: DC

## 2018-03-20 MED ORDER — VITAMIN A 7500 UNITS PO CAPS: 7500.0000 [IU] | ORAL_CAPSULE | Freq: Every day | ORAL | Status: DC

## 2018-03-20 MED ORDER — MAGNESIUM 30 MG PO TABS: 90.0000 mg | ORAL_TABLET | Freq: Every evening | ORAL | Status: DC

## 2018-03-20 MED ORDER — DEXAMETHASONE SODIUM PHOSPHATE 4 MG/ML IJ SOLN
INTRAMUSCULAR | Status: DC | PRN
  Administered 2018-03-20: 10 mg via INTRAVENOUS

## 2018-03-20 MED ORDER — DEXAMETHASONE 4 MG PO TABS
4.0000 mg | ORAL_TABLET | Freq: Three times a day (TID) | ORAL | Status: DC
  Administered 2018-03-22 – 2018-03-23 (×3): 4 mg via ORAL
  Filled 2018-03-20 (×3): qty 1

## 2018-03-20 MED ORDER — CHLORHEXIDINE GLUCONATE CLOTH 2 % EX PADS: 6.0000 | MEDICATED_PAD | Freq: Once | CUTANEOUS | Status: DC

## 2018-03-20 MED ORDER — HEPARIN SODIUM (PORCINE) 5000 UNIT/ML IJ SOLN
5000.0000 [IU] | Freq: Three times a day (TID) | INTRAMUSCULAR | Status: DC
  Administered 2018-03-21 – 2018-03-23 (×7): 5000 [IU] via SUBCUTANEOUS
  Filled 2018-03-20 (×7): qty 1

## 2018-03-20 MED ORDER — LEVETIRACETAM 500 MG PO TABS: 500.0000 mg | ORAL_TABLET | Freq: Two times a day (BID) | ORAL | Status: DC

## 2018-03-20 MED ORDER — SUGAMMADEX SODIUM 200 MG/2ML IV SOLN
INTRAVENOUS | Status: DC | PRN
  Administered 2018-03-20: 200 mg via INTRAVENOUS

## 2018-03-20 MED ORDER — THROMBIN (RECOMBINANT) 20000 UNITS EX SOLR
CUTANEOUS | Status: AC
  Filled 2018-03-20: qty 20000

## 2018-03-20 MED ORDER — CEFAZOLIN SODIUM-DEXTROSE 2-4 GM/100ML-% IV SOLN
2.0000 g | INTRAVENOUS | Status: AC
  Administered 2018-03-20: 2 g via INTRAVENOUS
  Filled 2018-03-20: qty 100

## 2018-03-20 MED ORDER — SENNOSIDES-DOCUSATE SODIUM 8.6-50 MG PO TABS: 1.0000 | ORAL_TABLET | Freq: Every evening | ORAL | Status: DC | PRN

## 2018-03-20 MED ORDER — DEXAMETHASONE SODIUM PHOSPHATE 10 MG/ML IJ SOLN
INTRAMUSCULAR | Status: AC
  Filled 2018-03-20: qty 1

## 2018-03-20 MED ORDER — HYDROMORPHONE HCL 2 MG PO TABS
2.0000 mg | ORAL_TABLET | ORAL | Status: DC | PRN
  Administered 2018-03-20 – 2018-03-22 (×5): 2 mg via ORAL
  Filled 2018-03-20 (×5): qty 1

## 2018-03-20 MED ORDER — EPHEDRINE 5 MG/ML INJ
INTRAVENOUS | Status: AC
  Filled 2018-03-20: qty 10

## 2018-03-20 MED ORDER — DEXAMETHASONE 6 MG PO TABS
6.0000 mg | ORAL_TABLET | Freq: Four times a day (QID) | ORAL | Status: AC
  Administered 2018-03-20 – 2018-03-21 (×4): 6 mg via ORAL
  Filled 2018-03-20 (×4): qty 1

## 2018-03-20 MED ORDER — BACITRACIN ZINC 500 UNIT/GM EX OINT
TOPICAL_OINTMENT | CUTANEOUS | Status: AC
  Filled 2018-03-20: qty 28.35

## 2018-03-20 MED ORDER — LABETALOL HCL 5 MG/ML IV SOLN
10.0000 mg | INTRAVENOUS | Status: DC | PRN
  Administered 2018-03-20: 20 mg via INTRAVENOUS
  Filled 2018-03-20: qty 4

## 2018-03-20 MED ORDER — VITAMIN C 500 MG PO TABS
500.0000 mg | ORAL_TABLET | Freq: Two times a day (BID) | ORAL | Status: DC
  Administered 2018-03-21 – 2018-03-23 (×5): 500 mg via ORAL
  Filled 2018-03-20 (×5): qty 1

## 2018-03-20 MED ORDER — LEVOTHYROXINE SODIUM 75 MCG PO TABS
75.0000 ug | ORAL_TABLET | Freq: Every day | ORAL | Status: DC
  Administered 2018-03-21 – 2018-03-23 (×3): 75 ug via ORAL
  Filled 2018-03-20 (×3): qty 1

## 2018-03-20 MED ORDER — SODIUM CHLORIDE 0.9 % IV SOLN
INTRAVENOUS | Status: DC | PRN
  Administered 2018-03-20: 20 ug/min via INTRAVENOUS

## 2018-03-20 MED ORDER — LACTATED RINGERS IV SOLN: INTRAVENOUS | Status: DC | PRN

## 2018-03-20 MED ORDER — DEXAMETHASONE 4 MG PO TABS
4.0000 mg | ORAL_TABLET | Freq: Four times a day (QID) | ORAL | Status: AC
  Administered 2018-03-21 – 2018-03-22 (×4): 4 mg via ORAL
  Filled 2018-03-20 (×4): qty 1

## 2018-03-20 MED ORDER — FENTANYL CITRATE (PF) 100 MCG/2ML IJ SOLN
25.0000 ug | INTRAMUSCULAR | Status: DC | PRN
  Administered 2018-03-20: 50 ug via INTRAVENOUS

## 2018-03-20 MED ORDER — MORPHINE SULFATE (PF) 2 MG/ML IV SOLN
1.0000 mg | INTRAVENOUS | Status: DC | PRN
  Administered 2018-03-20: 2 mg via INTRAVENOUS
  Administered 2018-03-20: 1 mg via INTRAVENOUS
  Administered 2018-03-20 – 2018-03-21 (×2): 2 mg via INTRAVENOUS
  Filled 2018-03-20 (×4): qty 1

## 2018-03-20 MED ORDER — ACETAMINOPHEN 325 MG PO TABS
650.0000 mg | ORAL_TABLET | ORAL | Status: DC | PRN
  Administered 2018-03-22 – 2018-03-23 (×2): 650 mg via ORAL
  Filled 2018-03-20 (×2): qty 2

## 2018-03-20 MED ORDER — DOCUSATE SODIUM 100 MG PO CAPS
100.0000 mg | ORAL_CAPSULE | Freq: Two times a day (BID) | ORAL | Status: DC | PRN
  Administered 2018-03-23: 100 mg via ORAL
  Filled 2018-03-20: qty 1

## 2018-03-20 MED ORDER — 0.9 % SODIUM CHLORIDE (POUR BTL) OPTIME
TOPICAL | Status: DC | PRN
  Administered 2018-03-20: 4000 mL

## 2018-03-20 MED ORDER — PROPOFOL 10 MG/ML IV BOLUS
INTRAVENOUS | Status: DC | PRN
  Administered 2018-03-20 (×2): 30 mg via INTRAVENOUS
  Administered 2018-03-20: 20 mg via INTRAVENOUS
  Administered 2018-03-20: 150 mg via INTRAVENOUS
  Administered 2018-03-20: 70 mg via INTRAVENOUS

## 2018-03-20 MED ORDER — FENTANYL CITRATE (PF) 100 MCG/2ML IJ SOLN
INTRAMUSCULAR | Status: AC
  Filled 2018-03-20: qty 2

## 2018-03-20 MED ORDER — ONDANSETRON HCL 4 MG/2ML IJ SOLN
INTRAMUSCULAR | Status: DC | PRN
  Administered 2018-03-20: 4 mg via INTRAVENOUS

## 2018-03-20 MED ORDER — ONDANSETRON HCL 4 MG/2ML IJ SOLN: 4.0000 mg | INTRAMUSCULAR | Status: DC | PRN

## 2018-03-20 MED ORDER — LIDOCAINE-EPINEPHRINE 0.5 %-1:200000 IJ SOLN
INTRAMUSCULAR | Status: DC | PRN
  Administered 2018-03-20: 15 mL

## 2018-03-20 MED ORDER — ESMOLOL HCL 100 MG/10ML IV SOLN
INTRAVENOUS | Status: DC | PRN
  Administered 2018-03-20: 30 mg via INTRAVENOUS
  Administered 2018-03-20: 20 mg via INTRAVENOUS

## 2018-03-20 MED ORDER — PROMETHAZINE HCL 25 MG PO TABS: 12.5000 mg | ORAL_TABLET | ORAL | Status: DC | PRN

## 2018-03-20 MED ORDER — POTASSIUM CHLORIDE IN NACL 20-0.9 MEQ/L-% IV SOLN
INTRAVENOUS | Status: DC
  Administered 2018-03-20 – 2018-03-21 (×2): via INTRAVENOUS
  Filled 2018-03-20 (×2): qty 1000

## 2018-03-20 MED ORDER — SODIUM CHLORIDE 0.9 % IV SOLN
INTRAVENOUS | Status: DC
  Administered 2018-03-20 (×2): via INTRAVENOUS

## 2018-03-20 MED ORDER — ACETAMINOPHEN 650 MG RE SUPP: 650.0000 mg | RECTAL | Status: DC | PRN

## 2018-03-20 MED ORDER — EPHEDRINE SULFATE-NACL 50-0.9 MG/10ML-% IV SOSY
PREFILLED_SYRINGE | INTRAVENOUS | Status: DC | PRN
  Administered 2018-03-20: 5 mg via INTRAVENOUS

## 2018-03-20 MED ORDER — VITAMIN K 100 MCG PO TABS: 100.0000 ug | ORAL_TABLET | Freq: Every day | ORAL | Status: DC

## 2018-03-20 MED ORDER — LIDOCAINE-EPINEPHRINE 0.5 %-1:200000 IJ SOLN
INTRAMUSCULAR | Status: AC
  Filled 2018-03-20: qty 1

## 2018-03-20 MED ORDER — ROCURONIUM BROMIDE 10 MG/ML (PF) SYRINGE
PREFILLED_SYRINGE | INTRAVENOUS | Status: DC | PRN
  Administered 2018-03-20: 20 mg via INTRAVENOUS
  Administered 2018-03-20: 80 mg via INTRAVENOUS

## 2018-03-20 MED ORDER — BACITRACIN ZINC 500 UNIT/GM EX OINT
TOPICAL_OINTMENT | CUTANEOUS | Status: DC | PRN
  Administered 2018-03-20: 1 via TOPICAL

## 2018-03-20 MED ORDER — THROMBIN 20000 UNITS EX SOLR
CUTANEOUS | Status: DC | PRN
  Administered 2018-03-20: 11:00:00

## 2018-03-20 MED ORDER — LEVETIRACETAM IN NACL 500 MG/100ML IV SOLN
500.0000 mg | Freq: Two times a day (BID) | INTRAVENOUS | Status: DC
  Administered 2018-03-20 – 2018-03-22 (×4): 500 mg via INTRAVENOUS
  Filled 2018-03-20 (×4): qty 100

## 2018-03-20 MED ORDER — CALCIUM CITRATE-VITAMIN D 315-200 MG-UNIT PO TABS: 2.0000 | ORAL_TABLET | Freq: Two times a day (BID) | ORAL | Status: DC

## 2018-03-20 MED ORDER — DIAZEPAM 2 MG PO TABS: 2.0000 mg | ORAL_TABLET | Freq: Every evening | ORAL | Status: DC | PRN

## 2018-03-20 MED ORDER — FAMOTIDINE IN NACL 20-0.9 MG/50ML-% IV SOLN
20.0000 mg | Freq: Every day | INTRAVENOUS | Status: DC
  Administered 2018-03-20 – 2018-03-21 (×2): 20 mg via INTRAVENOUS
  Filled 2018-03-20 (×2): qty 50

## 2018-03-20 MED ORDER — PROPOFOL 10 MG/ML IV BOLUS
INTRAVENOUS | Status: AC
  Filled 2018-03-20: qty 20

## 2018-03-20 MED ORDER — ONDANSETRON HCL 4 MG/2ML IJ SOLN
INTRAMUSCULAR | Status: AC
  Filled 2018-03-20: qty 2

## 2018-03-20 MED ORDER — ROCURONIUM BROMIDE 50 MG/5ML IV SOSY
PREFILLED_SYRINGE | INTRAVENOUS | Status: AC
  Filled 2018-03-20: qty 10

## 2018-03-20 MED ORDER — VITAMIN D 1000 UNITS PO TABS
4000.0000 [IU] | ORAL_TABLET | Freq: Every day | ORAL | Status: DC
  Administered 2018-03-21 – 2018-03-23 (×3): 4000 [IU] via ORAL
  Filled 2018-03-20 (×3): qty 4

## 2018-03-20 MED ORDER — SODIUM CHLORIDE 0.9 % IV SOLN
INTRAVENOUS | Status: DC | PRN
  Administered 2018-03-20: 10:00:00 via INTRAVENOUS

## 2018-03-20 MED ORDER — FENTANYL CITRATE (PF) 100 MCG/2ML IJ SOLN
INTRAMUSCULAR | Status: DC | PRN
  Administered 2018-03-20: 50 ug via INTRAVENOUS
  Administered 2018-03-20: 100 ug via INTRAVENOUS
  Administered 2018-03-20 (×2): 50 ug via INTRAVENOUS

## 2018-03-20 MED ORDER — TRAMADOL HCL 50 MG PO TABS
50.0000 mg | ORAL_TABLET | Freq: Four times a day (QID) | ORAL | Status: DC | PRN
  Administered 2018-03-20 – 2018-03-22 (×3): 50 mg via ORAL
  Filled 2018-03-20 (×3): qty 1

## 2018-03-20 MED ORDER — LIDOCAINE HCL (CARDIAC) PF 100 MG/5ML IV SOSY
PREFILLED_SYRINGE | INTRAVENOUS | Status: DC | PRN
  Administered 2018-03-20: 80 mg via INTRAVENOUS

## 2018-03-20 MED ORDER — PROMETHAZINE HCL 25 MG/ML IJ SOLN: 6.2500 mg | INTRAMUSCULAR | Status: DC | PRN

## 2018-03-20 SURGICAL SUPPLY — 92 items
APL SKNCLS STERI-STRIP NONHPOA (GAUZE/BANDAGES/DRESSINGS)
BENZOIN TINCTURE PRP APPL 2/3 (GAUZE/BANDAGES/DRESSINGS) IMPLANT
BLADE CLIPPER SURG (BLADE) ×4 IMPLANT
BLADE SAW GIGLI 16 STRL (MISCELLANEOUS) IMPLANT
BLADE SURG 15 STRL LF DISP TIS (BLADE) IMPLANT
BLADE SURG 15 STRL SS (BLADE)
BLADE ULTRA TIP 2M (BLADE) IMPLANT
BNDG GAUZE ELAST 4 BULKY (GAUZE/BANDAGES/DRESSINGS) ×4 IMPLANT
BUR ACORN 6.0 PRECISION (BURR) ×3 IMPLANT
BUR ACORN 6.0MM PRECISION (BURR) ×1
BUR MATCHSTICK NEURO 3.0 LAGG (BURR) IMPLANT
BUR SPIRAL ROUTER 2.3 (BUR) ×1 IMPLANT
BUR SPIRAL ROUTER 2.3MM (BUR) ×1
CANISTER SUCT 3000ML PPV (MISCELLANEOUS) ×4 IMPLANT
CARTRIDGE OIL MAESTRO DRILL (MISCELLANEOUS) ×4 IMPLANT
CATH VENTRIC 35X38 W/TROCAR LG (CATHETERS) IMPLANT
CLIP RANEY DISP (INSTRUMENTS) ×2 IMPLANT
CLIP VESOCCLUDE MED 6/CT (CLIP) IMPLANT
CONT SPEC 4OZ CLIKSEAL STRL BL (MISCELLANEOUS) ×6 IMPLANT
COVER WAND RF STERILE (DRAPES) ×8 IMPLANT
DECANTER SPIKE VIAL GLASS SM (MISCELLANEOUS) ×4 IMPLANT
DIFFUSER DRILL AIR PNEUMATIC (MISCELLANEOUS) ×6 IMPLANT
DRAIN SUBARACHNOID (WOUND CARE) IMPLANT
DRAPE CAMERA VIDEO/LASER (DRAPES) IMPLANT
DRAPE MICROSCOPE LEICA (MISCELLANEOUS) IMPLANT
DRAPE NEUROLOGICAL W/INCISE (DRAPES) ×4 IMPLANT
DRAPE ORTHO SPLIT 77X108 STRL (DRAPES)
DRAPE SURG 17X23 STRL (DRAPES) IMPLANT
DRAPE SURG ORHT 6 SPLT 77X108 (DRAPES) IMPLANT
DRAPE WARM FLUID 44X44 (DRAPE) ×4 IMPLANT
DURAMATRIX ONLAY 3X3 (Plate) ×2 IMPLANT
DURAPREP 6ML APPLICATOR 50/CS (WOUND CARE) ×4 IMPLANT
ELECT REM PT RETURN 9FT ADLT (ELECTROSURGICAL) ×4
ELECTRODE REM PT RTRN 9FT ADLT (ELECTROSURGICAL) ×2 IMPLANT
EVACUATOR 1/8 PVC DRAIN (DRAIN) IMPLANT
EVACUATOR SILICONE 100CC (DRAIN) IMPLANT
FORCEPS BIPOLAR SPETZLER 8 1.0 (NEUROSURGERY SUPPLIES) ×2 IMPLANT
GAUZE 4X4 16PLY RFD (DISPOSABLE) IMPLANT
GAUZE SPONGE 4X4 12PLY STRL (GAUZE/BANDAGES/DRESSINGS) ×4 IMPLANT
GLOVE BIO SURGEON STRL SZ8 (GLOVE) ×2 IMPLANT
GLOVE BIO SURGEON STRL SZ8.5 (GLOVE) ×2 IMPLANT
GLOVE BIOGEL PI IND STRL 6.5 (GLOVE) IMPLANT
GLOVE BIOGEL PI INDICATOR 6.5 (GLOVE) ×4
GLOVE ECLIPSE 6.5 STRL STRAW (GLOVE) ×6 IMPLANT
GLOVE SURG SS PI 6.5 STRL IVOR (GLOVE) ×4 IMPLANT
GLOVE SURG SS PI 7.5 STRL IVOR (GLOVE) ×12 IMPLANT
GOWN STRL REUS W/ TWL LRG LVL3 (GOWN DISPOSABLE) ×4 IMPLANT
GOWN STRL REUS W/ TWL XL LVL3 (GOWN DISPOSABLE) IMPLANT
GOWN STRL REUS W/TWL 2XL LVL3 (GOWN DISPOSABLE) IMPLANT
GOWN STRL REUS W/TWL LRG LVL3 (GOWN DISPOSABLE) ×12
GOWN STRL REUS W/TWL XL LVL3 (GOWN DISPOSABLE) ×4
HEMOSTAT SURGICEL 2X14 (HEMOSTASIS) IMPLANT
HOOK DURA 1/2IN (MISCELLANEOUS) ×4 IMPLANT
KIT BASIN OR (CUSTOM PROCEDURE TRAY) ×4 IMPLANT
KIT DRAIN CSF ACCUDRAIN (MISCELLANEOUS) IMPLANT
KIT TURNOVER KIT B (KITS) ×4 IMPLANT
MARKER SPHERE PSV REFLC 13MM (MARKER) ×8 IMPLANT
NDL HYPO 25X1 1.5 SAFETY (NEEDLE) ×2 IMPLANT
NDL SPNL 18GX3.5 QUINCKE PK (NEEDLE) IMPLANT
NEEDLE HYPO 25X1 1.5 SAFETY (NEEDLE) ×4 IMPLANT
NEEDLE SPNL 18GX3.5 QUINCKE PK (NEEDLE) IMPLANT
NS IRRIG 1000ML POUR BTL (IV SOLUTION) ×10 IMPLANT
OIL CARTRIDGE MAESTRO DRILL (MISCELLANEOUS) ×4
PACK CRANIOTOMY CUSTOM (CUSTOM PROCEDURE TRAY) ×4 IMPLANT
PATTIES SURGICAL .25X.25 (GAUZE/BANDAGES/DRESSINGS) IMPLANT
PATTIES SURGICAL .5 X.5 (GAUZE/BANDAGES/DRESSINGS) IMPLANT
PATTIES SURGICAL .5 X3 (DISPOSABLE) IMPLANT
PATTIES SURGICAL 1/4 X 3 (GAUZE/BANDAGES/DRESSINGS) IMPLANT
PATTIES SURGICAL 1X1 (DISPOSABLE) ×2 IMPLANT
PLATE 1.5 2H 17 DOU T (Plate) ×2 IMPLANT
PLATE 1.5 5HOLE SQUARE (Plate) ×2 IMPLANT
PLATE 1.5/0.5 13MM BURR HOLE (Plate) ×2 IMPLANT
RUBBERBAND STERILE (MISCELLANEOUS) IMPLANT
SCREW SELF DRILL HT 1.5/4MM (Screw) ×26 IMPLANT
SPECIMEN JAR SMALL (MISCELLANEOUS) IMPLANT
SPONGE NEURO XRAY DETECT 1X3 (DISPOSABLE) ×2 IMPLANT
SPONGE SURGIFOAM ABS GEL 100 (HEMOSTASIS) ×4 IMPLANT
STAPLER VISISTAT 35W (STAPLE) ×4 IMPLANT
SUT ETHILON 3 0 FSL (SUTURE) IMPLANT
SUT ETHILON 3 0 PS 1 (SUTURE) IMPLANT
SUT NURALON 4 0 TR CR/8 (SUTURE) ×12 IMPLANT
SUT SILK 0 TIES 10X30 (SUTURE) IMPLANT
SUT STEEL 0 (SUTURE)
SUT STEEL 0 18XMFL TIE 17 (SUTURE) IMPLANT
SUT VIC AB 2-0 CT2 18 VCP726D (SUTURE) ×8 IMPLANT
TOWEL GREEN STERILE (TOWEL DISPOSABLE) ×4 IMPLANT
TOWEL GREEN STERILE FF (TOWEL DISPOSABLE) ×4 IMPLANT
TRAY FOLEY MTR SLVR 16FR STAT (SET/KITS/TRAYS/PACK) ×4 IMPLANT
TUBE CONNECTING 12'X1/4 (SUCTIONS) ×1
TUBE CONNECTING 12X1/4 (SUCTIONS) ×3 IMPLANT
UNDERPAD 30X30 (UNDERPADS AND DIAPERS) ×4 IMPLANT
WATER STERILE IRR 1000ML POUR (IV SOLUTION) ×4 IMPLANT

## 2018-03-20 NOTE — Anesthesia Procedure Notes (Signed)
Arterial Line Insertion Start/End10/16/2019 10:15 AM, 03/20/2018 10:18 AM Performed by: Orlie Dakin, CRNA, CRNA  Patient location: Pre-op. Preanesthetic checklist: patient identified, IV checked, site marked, risks and benefits discussed, surgical consent, monitors and equipment checked and pre-op evaluation Lidocaine 1% used for infiltration and patient sedated Right, radial was placed Catheter size: 20 G Hand hygiene performed  and maximum sterile barriers used   Attempts: 1 Procedure performed without using ultrasound guided technique. Following insertion, dressing applied and Biopatch. Post procedure assessment: normal  Patient tolerated the procedure well with no immediate complications. Additional procedure comments: Arterial line placed per S Melini SRNA with my supervision.Marland Kitchen

## 2018-03-20 NOTE — Transfer of Care (Signed)
Immediate Anesthesia Transfer of Care Note  Patient: Marie Haynes  Procedure(s) Performed: Right Temporal craniotomy for tumor resection with brainlab (Right Head) APPLICATION OF CRANIAL NAVIGATION (N/A )  Patient Location: PACU  Anesthesia Type:General  Level of Consciousness: awake and patient cooperative  Airway & Oxygen Therapy: Patient Spontanous Breathing and Patient connected to face mask oxygen  Post-op Assessment: Report given to RN, Post -op Vital signs reviewed and stable and Patient moving all extremities X 4  Post vital signs: Reviewed and stable  Last Vitals:  Vitals Value Taken Time  BP 174/78 03/20/2018  2:44 PM  Temp    Pulse 68 03/20/2018  2:48 PM  Resp 13 03/20/2018  2:48 PM  SpO2 100 % 03/20/2018  2:48 PM  Vitals shown include unvalidated device data.  Last Pain:  Vitals:   03/20/18 0831  PainSc: 0-No pain      Patients Stated Pain Goal: 3 (36/14/43 1540)  Complications: No apparent anesthesia complications

## 2018-03-20 NOTE — Anesthesia Postprocedure Evaluation (Signed)
Anesthesia Post Note  Patient: Marie Haynes  Procedure(s) Performed: Right Temporal craniotomy for tumor resection with brainlab (Right Head) APPLICATION OF CRANIAL NAVIGATION (N/A )     Patient location during evaluation: PACU Anesthesia Type: General Level of consciousness: sedated Pain management: pain level controlled Vital Signs Assessment: post-procedure vital signs reviewed and stable Respiratory status: spontaneous breathing and respiratory function stable Cardiovascular status: stable Postop Assessment: no apparent nausea or vomiting Anesthetic complications: no    Last Vitals:  Vitals:   03/20/18 1545 03/20/18 1600  BP: (!) 154/65 (!) 149/75  Pulse: (!) 54 65  Resp: 12 17  Temp:  36.4 C  SpO2: 100% 100%    Last Pain:  Vitals:   03/20/18 1530  PainSc: 7                  Isabel Ardila DANIEL

## 2018-03-20 NOTE — Anesthesia Procedure Notes (Signed)
Procedure Name: Intubation Date/Time: 03/20/2018 10:48 AM Performed by: Duane Boston, MD Pre-anesthesia Checklist: Patient identified, Emergency Drugs available, Suction available, Patient being monitored and Timeout performed Patient Re-evaluated:Patient Re-evaluated prior to induction Oxygen Delivery Method: Circle system utilized Preoxygenation: Pre-oxygenation with 100% oxygen Induction Type: IV induction Ventilation: Mask ventilation without difficulty Laryngoscope Size: Miller and 2 Grade View: Grade II Tube type: Oral Tube size: 7.0 mm Number of attempts: 3 Airway Equipment and Method: Stylet Placement Confirmation: ETT inserted through vocal cords under direct vision,  positive ETCO2,  CO2 detector and breath sounds checked- equal and bilateral Secured at: 22 cm Tube secured with: Tape Dental Injury: Teeth and Oropharynx as per pre-operative assessment  Difficulty Due To: Difficult Airway- due to anterior larynx Comments: Overbite and limited neck mobility seen preop.  Student CRNA made first attempt with Mac 3 but could not see anything except epiglottis.  I was able to obtain a grade 2 view with Sabra Heck 2, but no CO2, tube removed and intubated without difficulty on my second attempt.

## 2018-03-20 NOTE — Anesthesia Procedure Notes (Signed)
Central Venous Catheter Insertion Performed by: Duane Boston, MD, anesthesiologist Start/End10/16/2019 9:57 AM, 03/20/2018 10:07 AM Patient location: Pre-op. Preanesthetic checklist: patient identified, IV checked, site marked, risks and benefits discussed, surgical consent, monitors and equipment checked, pre-op evaluation, timeout performed and anesthesia consent Position: Trendelenburg Lidocaine 1% used for infiltration and patient sedated Hand hygiene performed , maximum sterile barriers used  and Seldinger technique used Catheter size: 8 Fr Total catheter length 16. Central line was placed.Double lumen Procedure performed using ultrasound guided technique. Ultrasound Notes:anatomy identified, needle tip was noted to be adjacent to the nerve/plexus identified, no ultrasound evidence of intravascular and/or intraneural injection and image(s) printed for medical record Attempts: 1 Following insertion, dressing applied, line sutured and Biopatch. Post procedure assessment: blood return through all ports, free fluid flow and no air  Patient tolerated the procedure well with no immediate complications.

## 2018-03-21 ENCOUNTER — Inpatient Hospital Stay (HOSPITAL_COMMUNITY): Payer: Medicare Other

## 2018-03-21 ENCOUNTER — Encounter (HOSPITAL_COMMUNITY): Payer: Self-pay | Admitting: Neurosurgery

## 2018-03-21 MED ORDER — GADOBUTROL 1 MMOL/ML IV SOLN
7.5000 mL | Freq: Once | INTRAVENOUS | Status: AC | PRN
  Administered 2018-03-21: 5.5 mL via INTRAVENOUS

## 2018-03-21 MED FILL — Thrombin (Recombinant) For Soln 20000 Unit: CUTANEOUS | Qty: 1 | Status: AC

## 2018-03-21 NOTE — Plan of Care (Signed)
Tolerating clear liquid diet

## 2018-03-21 NOTE — Progress Notes (Signed)
Subjective: The patient is alert and pleasant.    Her sons are at the bedside.  Objective: Vital signs in last 24 hours: Temp:  [97.6 F (36.4 C)-98.8 F (37.1 C)] 98.8 F (37.1 C) (10/17 0800) Pulse Rate:  [53-91] 69 (10/17 1300) Resp:  [0-25] 14 (10/17 1300) BP: (103-179)/(40-78) 126/56 (10/17 1300) SpO2:  [94 %-100 %] 94 % (10/17 1300) Arterial Line BP: (120-185)/(44-69) 129/55 (10/17 1000) Estimated body mass index is 21.61 kg/m as calculated from the following:   Height as of this encounter: 5\' 3"  (1.6 m).   Weight as of this encounter: 55.3 kg.   Intake/Output from previous day: 10/16 0701 - 10/17 0700 In: 3511.4 [I.V.:3011.1; IV Piggyback:500.4] Out: 1655 [Urine:1405; Blood:250] Intake/Output this shift: Total I/O In: -  Out: 400 [Urine:400]  Physical exam the patient is alert and oriented x3.  Her speech is normal.  She is moving all 4 extremities well.  Lab Results: Recent Labs    03/20/18 0823  WBC 6.6  HGB 11.7*  HCT 38.8  PLT 254   BMET Recent Labs    03/20/18 0823  NA 140  K 3.7  CL 109  CO2 23  GLUCOSE 99  BUN 14  CREATININE 0.72  CALCIUM 9.1    Studies/Results: Mr [name] IW Contrast  Result Date: 03/21/2018 CLINICAL DATA:  Status post right temporal mass resection. EXAM: MRI HEAD WITHOUT AND WITH CONTRAST TECHNIQUE: Multiplanar, multiecho pulse sequences of the brain and surrounding structures were obtained without and with intravenous contrast. CONTRAST:  5.5 mL Gadavist COMPARISON:  Brain MRI 03/15/2018 FINDINGS: BRAIN: Status post resection of tumor of the right anterior temporal lobe. There is magnetic susceptibility effects and abnormal diffusion within the cavity, consistent with postoperative blood products. There is a wedge-shaped area of diffusion restriction extending posteriorly and medially toward the atrium of the right lateral ventricle. There is a large amount of anterior hydrocephalus with mild mass effect on the frontal poles.  The extent of the surrounding hyperintense T2-weighted signal is unchanged. Mass effect on the ventricles has improved. There is a focus of chronic microhemorrhage adjacent to the occipital horn of the left lateral ventricle. At the resection site, there is somewhat nodular contrast enhancement contiguous with the dura at the most anterior margin friend image 53 of the axial postcontrast T1-weighted sequence.). There are other mildly nodular areas of enhancement along the medial superior margin (series 11, images 65 and 66). Just anterior to the distal M1 segment of the right middle cerebral artery is a small focus of contrast enhancement (series 11, image 63). No remote contrast-enhancement. VASCULAR: Major intracranial arterial and venous sinus flow voids are normal. SKULL AND UPPER CERVICAL SPINE: Recent right pterional craniotomy. Small overlying subgaleal hematoma. SINUSES/ORBITS: No fluid levels or advanced mucosal thickening. No mastoid or middle ear effusion. There are bilateral lens replacements. IMPRESSION: 1. Status post resection of right temporal lobe tumor. This will serve as a baseline for future studies. 2. Areas of somewhat nodular contrast enhancement along the anterior and medial resection margins are nonspecific at this time, but warrant close attention for the presence of residual tumor on the follow-up study. 3. Wedge-shaped area diffusion restriction extending dorsally and medially from the resection cavity, consistent with acute ischemia. Please note that on follow-up studies, this area may show contrast enhancement. 4. Postoperative findings including small subgaleal hematoma and moderate pneumocephalus. Electronically Signed   By: Ulyses Jarred M.D.   On: 03/21/2018 14:10    Assessment/Plan: Postop day #  1: The patient is doing well.  She may go home tomorrow.  I have answered all their questions.  LOS: 1 day     Ophelia Charter 03/21/2018, 2:40 PM

## 2018-03-21 NOTE — Progress Notes (Signed)
Some oozing noticed on head wrap over night, reinforced around midnight.

## 2018-03-22 ENCOUNTER — Other Ambulatory Visit: Payer: Self-pay | Admitting: Radiation Therapy

## 2018-03-22 MED ORDER — LEVETIRACETAM 500 MG PO TABS
500.0000 mg | ORAL_TABLET | Freq: Two times a day (BID) | ORAL | Status: DC
  Administered 2018-03-22 – 2018-03-23 (×3): 500 mg via ORAL
  Filled 2018-03-22 (×3): qty 1

## 2018-03-22 MED ORDER — HYDROMORPHONE HCL 2 MG PO TABS: 2.0000 mg | ORAL_TABLET | ORAL | 0 refills | Status: DC | PRN

## 2018-03-22 MED ORDER — FAMOTIDINE 20 MG PO TABS
20.0000 mg | ORAL_TABLET | Freq: Every day | ORAL | Status: DC
  Administered 2018-03-22: 20 mg via ORAL
  Filled 2018-03-22: qty 1

## 2018-03-22 NOTE — Progress Notes (Signed)
Pt arrived to the unit, oriented to room family at the bedside. Dressing in place, to Rt temporal area, CDI. No new drainage noted. Reminded pt to call for assistance with ambulation, call bell with in reach.

## 2018-03-22 NOTE — Discharge Summary (Signed)
Physician Discharge Summary  Patient ID: Marie Haynes MRN: [id number] DOB/AGE: [birth year] 82 y.o.  Admit date: 03/20/2018 Discharge date: 03/22/2018  Admission Diagnoses: Right temporal brain tumor  Discharge Diagnoses: The same Active Problems:   Brain tumor [facility])   Discharged Condition: good  Hospital Course: Dr. Christella Noa performed a right craniotomy for resection of tumor on the patient on 03/20/2018.  Patient's postoperative course was largely unremarkable.  She did have some bloody drainage from her scalp wound.  I put the staple in which stopped it.  On postoperative day #2 the patient requested discharge her home.  The patient, and her daughter, were given written and oral discharge instructions.  All their questions were answered.  Consults: None Significant Diagnostic Studies: Brain MRI Treatments: Craniotomy for resection of tumor Discharge Exam: Blood pressure (!) 118/58, pulse 66, temperature 99.4 F (37.4 C), temperature source Oral, resp. rate 12, height 5\' 3"  (1.6 m), weight 55.3 kg, SpO2 97 %. The patient is alert and pleasant.  She is oriented x3.  Her speech and strength is normal.  Her craniotomy incision had a small area of bleeding.  I placed a staple.  Disposition: Home  Discharge Instructions    Call MD for:  difficulty breathing, headache or visual disturbances   Complete by:  As directed    Call MD for:  extreme fatigue   Complete by:  As directed    Call MD for:  hives   Complete by:  As directed    Call MD for:  persistant dizziness or light-headedness   Complete by:  As directed    Call MD for:  persistant nausea and vomiting   Complete by:  As directed    Call MD for:  redness, tenderness, or signs of infection (pain, swelling, redness, odor or green/yellow discharge around incision site)   Complete by:  As directed    Call MD for:  severe uncontrolled pain   Complete by:  As directed    Call MD for:  temperature >100.4   Complete by:  As  directed    Diet - low sodium heart healthy   Complete by:  As directed    Discharge instructions   Complete by:  As directed    Call [phone] for a followup appointment. Take a stool softener while you are using pain medications.   Driving Restrictions   Complete by:  As directed    Do not drive for 2 weeks.   Increase activity slowly   Complete by:  As directed    Lifting restrictions   Complete by:  As directed    Do not lift more than 5 pounds. No excessive bending or twisting.   May shower / Bathe   Complete by:  As directed    Remove the dressing for 3 days after surgery.  You may shower, but leave the incision alone.   Remove dressing in 48 hours   Complete by:  As directed    Your stitches are under the scan and will dissolve by themselves. The Steri-Strips will fall off after you take a few showers. Do not rub back or pick at the wound, Leave the wound alone.     Allergies as of 03/22/2018      Reactions   Oxycodone-acetaminophen Anaphylaxis, Nausea And Vomiting   Codeine Other (See Comments)   constipation      Medication List    STOP taking these medications   diazepam 2 MG tablet Commonly known as:  VALIUM   fish oil-omega-3 fatty acids 1000 MG capsule     TAKE these medications   CALCIUM + D PO Take 3 tablets by mouth 2 (two) times daily.   cholecalciferol 400 units Tabs tablet Commonly known as:  VITAMIN D Take 4,000 Units by mouth daily.   dexamethasone 2 MG tablet Commonly known as:  DECADRON Take 1 tablet (2 mg total) by mouth 2 (two) times daily for 20 doses.   docusate sodium 100 MG capsule Commonly known as:  COLACE Take 100 mg by mouth 2 (two) times daily as needed for mild constipation.   GLUCOSAMINE 1500 COMPLEX PO Take 2 tablets by mouth 2 (two) times daily.   HYDROmorphone 2 MG tablet Commonly known as:  DILAUDID Take 1 tablet (2 mg total) by mouth every 4 (four) hours as needed for severe pain.   levETIRAcetam 500 MG  tablet Commonly known as:  KEPPRA Take 1 tablet (500 mg total) by mouth 2 (two) times daily.   levothyroxine 75 MCG tablet Commonly known as:  SYNTHROID, LEVOTHROID Take 1 tablet (75 mcg total) by mouth daily before breakfast.   magnesium 30 MG tablet Take 90 mg by mouth every evening.   RED YEAST RICE PO Take 1 tablet by mouth daily.   vitamin A 7500 UNIT capsule Take 7,500 Units by mouth daily.   vitamin C 500 MG tablet Commonly known as:  ASCORBIC ACID Take 500 mg by mouth 2 (two) times daily.   vitamin E 400 UNIT capsule Take 400 Units by mouth every other day.   vitamin k 100 MCG tablet Take 100 mcg by mouth daily.        Signed: Ophelia Charter 03/22/2018, 7:58 AM

## 2018-03-22 NOTE — Progress Notes (Signed)
MDT

## 2018-03-22 NOTE — Evaluation (Signed)
Physical Therapy Evaluation Patient Details Name: Marie Haynes MRN: [id number] DOB: 25-Nov-1931 Today's Date: 03/22/2018   History of Present Illness  pt is an 84 y/o female with pmh of DJD, osteoporosis, back surgery, admitted for surgery due to HA with CT showing right trmporal lobe tumor.  Pt s/p crani for tumor resection 10/16.  Clinical Impression  Pt admitted with/for tumor resection.  Pt currently limited functionally by balance deficits, but should be safe with family assist of mobility.  Pt knows to ask for OPPT services if balance issues continue to persist.  Will sign off at this time.     Follow Up Recommendations No PT follow up;Other (comment)(pt/family understand as MD for OPPT if balance deficits persist)    Equipment Recommendations  Rolling walker with 5" wheels    Recommendations for Other Services       Precautions / Restrictions Precautions Precautions: Fall      Mobility  Bed Mobility Overal bed mobility: Modified Independent                Transfers Overall transfer level: Needs assistance   Transfers: Sit to/from Stand Sit to Stand: Supervision         General transfer comment: steady statically. no hands needed  Ambulation/Gait Ambulation/Gait assistance: Min assist Gait Distance (Feet): 300 Feet Assistive device: None;Rolling walker (2 wheeled) Gait Pattern/deviations: Step-through pattern;Scissoring;Drifts right/left;Staggering left;Staggering right Gait velocity: moderate speed Gait velocity interpretation: <1.8 ft/sec, indicate of risk for recurrent falls General Gait Details: mild unsteadiness overall, more unsteady without RW.  With RW pt drifts right, closing in on obstacles on the right, but also drifts and mildly staggers with looking up, down, left or right..  Stairs Stairs: Yes Stairs assistance: Min assist Stair Management: One rail Right;Alternating pattern;Forwards Number of Stairs: 3    Wheelchair Mobility     Modified Rankin (Stroke Patients Only)       Balance Overall balance assessment: Needs assistance Sitting-balance support: No upper extremity supported Sitting balance-Leahy Scale: Fair     Standing balance support: No upper extremity supported;Bilateral upper extremity supported Standing balance-Leahy Scale: Fair Standing balance comment: statically steady, dynamically unsteady                             Pertinent Vitals/Pain Pain Assessment: Faces Faces Pain Scale: Hurts a little bit Pain Location: head Pain Descriptors / Indicators: Aching Pain Intervention(s): Monitored during session    Home Living Family/patient expects to be discharged to:: Private residence Living Arrangements: Spouse/significant other Available Help at Discharge: Family;Available 24 hours/day Type of Home: House Home Access: Stairs to enter Entrance Stairs-Rails: Psychiatric nurse of Steps: several Home Layout: One level Home Equipment: Cane - single point      Prior Function Level of Independence: Independent               Hand Dominance   Dominant Hand: Right    Extremity/Trunk Assessment        Lower Extremity Assessment Lower Extremity Assessment: Overall WFL for tasks assessed       Communication   Communication: No difficulties  Cognition Arousal/Alertness: Awake/alert Behavior During Therapy: WFL for tasks assessed/performed Overall Cognitive Status: Within Functional Limits for tasks assessed  General Comments      Exercises     Assessment/Plan    PT Assessment Patent does not need any further PT services(Family aware to call [facility] or 1*MD if balance poor in mont)  PT Problem List         PT Treatment Interventions      PT Goals (Current goals can be found in the Care Plan section)  Acute Rehab PT Goals PT Goal Formulation: All assessment and education complete, DC  therapy    Frequency     Barriers to discharge        Co-evaluation               AM-PAC PT "6 Clicks" Daily Activity  Outcome Measure Difficulty turning over in bed (including adjusting bedclothes, sheets and blankets)?: None Difficulty moving from lying on back to sitting on the side of the bed? : None Difficulty sitting down on and standing up from a chair with arms (e.g., wheelchair, bedside commode, etc,.)?: None Help needed moving to and from a bed to chair (including a wheelchair)?: A Little Help needed walking in hospital room?: A Little Help needed climbing 3-5 steps with a railing? : A Little 6 Click Score: 21    End of Session   Activity Tolerance: Patient tolerated treatment well Patient left: in bed;with call bell/phone within reach;with family/visitor present Nurse Communication: Mobility status PT Visit Diagnosis: Unsteadiness on feet (R26.81)    Time: 2025-4270 PT Time Calculation (min) (ACUTE ONLY): 29 min   Charges:   PT Evaluation $PT Eval Moderate Complexity: 1 Mod PT Treatments $Gait Training: 8-22 mins        03/22/2018  Donnella Sham, PT Acute Rehabilitation Services [phone]  (pager) [phone]  (office)  Tessie Fass Dorella Laster 03/22/2018, 12:19 PM

## 2018-03-23 NOTE — Progress Notes (Signed)
Patient ID: Marie Haynes, female   DOB: 1932/03/30, 84 y.o.   MRN: [id number] Doing well no new neuro complaints  Neurologically stable incision clean dry and intact to call for bandage  Discharge home per Dr. Arnoldo Morale

## 2018-03-25 NOTE — Consult Note (Addendum)
            [facility] CM Primary Care Navigator  03/25/2018  Marie Haynes Feb 17, 1932 [id number]   Attemptto seepatient at the bedside to identify possible discharge needsbut shewas alreadydischargedhomeover the weekend.  Patient was directed by primary care provider to the ER for sudden new onset of headache. CT result with extensive changes. Per MD note,patientwas admitted for right temporal brain tumor, underwent right craniotomy for resection of tumor and her postoperative course was largely unremarkable.  Primary care provider's office is listed as providing transition of care (TOC) follow-up.   Patient has discharge instruction to call for a follow-up appointment with neurosurgery post hospitalization.   For additional questions please contact:  Edwena Felty A. Shanira Tine, BSN, RN-BC [facility] PRIMARY CARE Navigator Cell: [phone]

## 2018-03-28 ENCOUNTER — Other Ambulatory Visit: Payer: Self-pay | Admitting: Radiation Therapy

## 2018-03-28 NOTE — Op Note (Signed)
03/20/2018  9:25 AM  PATIENT:  Marie Haynes  84 y.o. female with a newly diagnosed mass in the anterior temporal lobe on the right side. She is admitted for presumed tumor resection.   PRE-OPERATIVE DIAGNOSIS:  Brain tumor, right anterior temporal  POST-OPERATIVE DIAGNOSIS:  Brain tumor, right anterior temporal  PROCEDURE:  Procedure(s): Right Temporal craniotomy for tumor resection with brainlab APPLICATION OF CRANIAL NAVIGATION  SURGEON: Surgeon(s): Ashok Pall, MD Newman Pies, MD  ASSISTANTS:jenkins, Dellis Filbert  ANESTHESIA:   general  EBL:  No intake/output data recorded.  BLOOD ADMINISTERED:none    COUNT:per nursing  DRAINS: none   SPECIMEN:  Source of Specimen:  temporal lobe, right  DICTATION: Victory Dakin Hooper was taken to the operating room, intubated, and placed under a general anesthetic without difficulty. A foley catheter was placed under sterile conditions. She was positioned supine on the operating table. I placed her head in a three pin [place] head holder once adequate anesthesia was obtained. I registered her head to the brain lab navigation system, and validated the registration. Using the preoperative planning I traced out the planned incision. Her head was shaved, prepped,and draped in a sterile manner. I made an incision starting anterior to the tragus at the level of the zygoma extending posterior to the ear and extending anterior to the hairline to allow for exposure of the middle fossa on the right. I opened the scalp with a 10 blade and reflected the scalp rostrally. I used monopolar cautery to divide the temporalis and reflected a portion overlying the temporal bone with the scalp flap. The two were held in place with fish hooks. I then turned a craniotomy by creating burr holes, and connecting them with the router burr. I used the Le[place]ell rongeur to further expose the middle fossa and allow for complete access to the temporal tip. I opened the dura  and reflected it rostrally also. With the temporal lobe exposed I started the anterior temporal lobectomy and tumor resection. I used bipolars to cauterize the pia and with suction, and scissors started my lobectomy. I used the suction and bipolar cautery to systematically remove the anterior temporal lobe. The abnormal tissue was readily identified both by navigation and with the naked eye. I removed brain until I reached the temporal horn of the lateral ventricle, the anterior middle fossa, and to the sylvian fissure. I during the resection would routinely check the extent of resection with the navigation system.  Dr. Arnoldo Morale assisted with the resection. Once I reached what I believed to be tumor free brain, and bony margins laterally and rostrally, along with medially and superiorly I used navigation to check again.  I then used irrigation,and achieved hemostasis in the tumor bed with gelfoam, and surgicel, though not in immediate proximity to the ventricle. I approximated the dura with suture, the bone flap with plates and screws, the temporalis muscle with suture, the galea with suture, and finally the scalp with suture. I applied a sterile dressing. She was extubated and taken to the PACU moving all extremities.   PLAN OF CARE: Admit to inpatient   PATIENT DISPOSITION:  PACU - hemodynamically stable.   Delay start of Pharmacological VTE agent (>24hrs) due to surgical blood loss or risk of bleeding:  yes

## 2018-04-03 NOTE — Progress Notes (Signed)
Location/Histology of Brain Tumor:  03/20/18 Diagnosis 1. 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 SEE NOTE 2. 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 SEE NOTE  Patient presented with symptoms of:  She presented to her PCP on 03/15/18 with reports of an atypical headache for 2 days. Her PCP ordered a CT head, and she was sent to the ED based on the results.   Past or anticipated interventions, if any, per neurosurgery:  03/20/18 PROCEDURE:  Procedure(s): Right Temporal craniotomy for tumor resection with brainlab APPLICATION OF CRANIAL NAVIGATION  SURGEON: Surgeon(s): Ashok Pall, MD Newman Pies, MD  Past or anticipated interventions, if any, per medical oncology:  She had an apt with Dr. Mickeal Skinner earlier today.   Dose of Decadron, if applicable: N/A  Recent neurologic symptoms, if any:   Seizures: Her family reports that she may have had a surgery 04/01/18  Headaches: Yes.   Nausea: No  Dizziness/ataxia: No  Difficulty with hand coordination: No  Focal numbness/weakness: Generalized weakness  Visual deficits/changes: She has had some double vision since her surgery.   Confusion/Memory deficits: No  Painful bone metastases at present, if any: None  SAFETY ISSUES:  Prior radiation? No  Pacemaker/ICD? No  Possible current pregnancy? No  Is the patient on methotrexate? No  Additional Complaints / other details:   She had vital signs taken in Dr. Renda Rolls office an hour ago:  135/70, 100 HR, 17 R, 100 Room air, 97.6 temp.

## 2018-04-04 NOTE — Progress Notes (Signed)
Radiation Oncology         [phone] ________________________________  Initial outpatient Consultation  Name: Marie Haynes MRN: [id number]  Date: 04/05/2018  DOB: 17-Nov-1931  CC:Paz, Alda Berthold, MD  Mickeal Skinner Acey Lav, MD   REFERRING PHYSICIAN: Ventura Sellers, MD  DIAGNOSIS:    ICD-10-CM   1. Cancer of temporal lobe (HCC) C71.2     Glioblastoma, right temporal lobe  CHIEF COMPLAINT: Here to discuss management of brain cancer  HISTORY OF PRESENT ILLNESS::Marie Haynes is a 84 y.o. female who presented to her PCP, Dr. Larose Kells, on 03/15/2018 with reports of an atypical headache for 2 days. Dr. Larose Kells ordered a CT scan of the head which showed extensive right temporal lobe vasogenic edema with moderate mass effect and minimal midline shift toward the left. Based on these results, she was sent to the ED where a brain MRI was performed and demonstrated a solitary large 5 cm heterogeneously enhancing, partially necrotic intra-axial mass in the right temporal lobe. The mass extended to the surface of the anterior temporal tip and was inseparable from the overlying dura which appeared mildly thickened.   She subsequently underwent right temporal craniotomy for tumor resection with Dr. Christella Noa on 03/20/2018. 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  Post operative MRI of brain on 03-21-18 revealed baseline tumor cavity with nonspecific nodular contrast enhancement along anterior and medial resection margins.  Acute ischemia adjacent to cavity.  On review of systems, the patient and her family report a possible post op seizure on 04-01-18. She is on Keppra now and follows w/ Dr Mickeal Skinner.  She denies current HAs or nausea.  No ataxia. No coordination issues.  + generalized weakness and some double vision postoperatively.  No current memory issues or confusion.  Cognition improved after surgery.  She has fluent speech. See remaining ROS below.  PREVIOUS RADIATION THERAPY:  No  PAST MEDICAL HISTORY:  has a past medical history of Anxiety and depression, Constipation, DJD (degenerative joint disease), Family history of adverse reaction to anesthesia, Hypothyroidism, Increased urinary frequency, LEG CRAMPS, NOCTURNAL (02/22/2007), Osteoporosis, Spinal stenosis, Stress incontinence, Symptomatic cholelithiasis (2017), and Urge and stress incontinence.    PAST SURGICAL HISTORY: Past Surgical History:  Procedure Laterality Date  . APPLICATION OF CRANIAL NAVIGATION N/A 03/20/2018   Procedure: APPLICATION OF CRANIAL NAVIGATION;  Surgeon: Ashok Pall, MD;  Location: [place];  Service: Neurosurgery;  Laterality: N/A;  . BACK SURGERY  [phone]  . COLONOSCOPY    . CRANIOTOMY Right 03/20/2018   Procedure: Right Temporal craniotomy for tumor resection with brainlab;  Surgeon: Ashok Pall, MD;  Location: [place];  Service: Neurosurgery;  Laterality: Right;  Right Temporal craniotomy for tumor resection with brainlab  . DILATION AND CURETTAGE OF UTERUS    . EYE SURGERY Bilateral    cataract    FAMILY HISTORY: family history includes Heart attack (age of onset: 57) in her mother; Heart failure in her mother.  SOCIAL HISTORY:  reports that she has never smoked. She has never used smokeless tobacco. She reports that she drinks alcohol. She reports that she does not use drugs.  ALLERGIES: Oxycodone-acetaminophen and Codeine  MEDICATIONS:  Current Outpatient Medications  Medication Sig Dispense Refill  . Calcium Carbonate-Vitamin D (CALCIUM + D PO) Take 3 tablets by mouth 2 (two) times daily.     . cholecalciferol (VITAMIN D) 400 UNITS TABS Take 4,000 Units by mouth daily.     Marland Kitchen docusate sodium (COLACE) 100 MG capsule Take 100  mg by mouth 2 (two) times daily as needed for mild constipation.    . Glucosamine-Chondroit-Vit C-Mn (GLUCOSAMINE 1500 COMPLEX PO) Take 2 tablets by mouth 2 (two) times daily.      Marland Kitchen HYDROmorphone (DILAUDID) 2 MG tablet Take 1 tablet (2 mg total) by mouth  every 4 (four) hours as needed for severe pain. 30 tablet 0  . levETIRAcetam (KEPPRA) 250 MG tablet Take 1 tablet (250 mg total) by mouth 2 (two) times daily. 60 tablet 0  . levothyroxine (SYNTHROID, LEVOTHROID) 75 MCG tablet Take 1 tablet (75 mcg total) by mouth daily before breakfast. 30 tablet 3  . magnesium 30 MG tablet Take 90 mg by mouth every evening.    . Red Yeast Rice Extract (RED YEAST RICE PO) Take 1 tablet by mouth daily.    . vitamin A 7500 UNIT capsule Take 7,500 Units by mouth daily.    . vitamin C (ASCORBIC ACID) 500 MG tablet Take 500 mg by mouth 2 (two) times daily.     . vitamin E 400 UNIT capsule Take 400 Units by mouth every other day.     . vitamin k 100 MCG tablet Take 100 mcg by mouth daily.       No current facility-administered medications for this encounter.     REVIEW OF SYSTEMS:  A 10+ POINT REVIEW OF SYSTEMS WAS OBTAINED including neurology, dermatology, psychiatry, cardiac, respiratory, lymph, extremities, GI, GU, Musculoskeletal, constitutional, breasts, reproductive, HEENT.  All pertinent positives are noted in the HPI.  All others are negative.   PHYSICAL EXAM: per med/onc vitals today:  135/70, 100 HR, 17 R, 100 Room air, 97.6 temp.  General: Alert and oriented, in no acute distress HEENT: Head is normocephalic. Extraocular movements are intact. Oropharynx is clear. Neck: Neck is supple, no palpable cervical or supraclavicular lymphadenopathy. Heart: Regular in rate and rhythm with no murmurs, rubs, or gallops. Chest: Clear to auscultation bilaterally, with no rhonchi, wheezes, or rales. Abdomen: Soft, nontender, nondistended, with no rigidity or guarding. Extremities: No cyanosis or edema. Lymphatics: see Neck Exam Skin: No concerning lesions. Healing right temporal scalp scar Musculoskeletal: symmetric strength and muscle tone throughout. 4/5 strength throughout.  Neurologic: Cranial nerves II through XII are grossly intact. No obvious focalities.  Speech is fluent. Coordination is intact. Psychiatric: Judgment and insight are intact. Affect is appropriate.  KPS = 70  100 - Normal; no complaints; no evidence of disease. 90   - Able to carry on normal activity; minor signs or symptoms of disease. 80   - Normal activity with effort; some signs or symptoms of disease. 73   - Cares for self; unable to carry on normal activity or to do active work. 60   - Requires occasional assistance, but is able to care for most of his personal needs. 50   - Requires considerable assistance and frequent medical care. 57   - Disabled; requires special care and assistance. 42   - Severely disabled; hospital admission is indicated although death not imminent. 8   - Very sick; hospital admission necessary; active supportive treatment necessary. 10   - Moribund; fatal processes progressing rapidly. 0     - Dead  Karnofsky DA, Abelmann [place], Craver LS and Burchenal [facility] [phone]) The use of the nitrogen mustards in the palliative treatment of carcinoma: with particular reference to bronchogenic carcinoma Cancer 1 [id number]  LABORATORY DATA:  Lab Results  Component Value Date   WBC 6.6 03/20/2018   HGB 11.7 (L) 03/20/2018  HCT 38.8 03/20/2018   MCV 96.3 03/20/2018   PLT 254 03/20/2018   CMP     Component Value Date/Time   NA 140 03/20/2018 0823   K 3.7 03/20/2018 0823   CL 109 03/20/2018 0823   CO2 23 03/20/2018 0823   GLUCOSE 99 03/20/2018 0823   BUN 14 03/20/2018 0823   CREATININE 0.72 03/20/2018 0823   CALCIUM 9.1 03/20/2018 0823   PROT 7.2 02/28/2018 1016   ALBUMIN 4.1 02/28/2018 1016   AST 17 02/28/2018 1016   ALT 17 02/28/2018 1016   ALKPHOS 44 02/28/2018 1016   BILITOT 0.6 02/28/2018 1016   GFRNONAA >60 03/20/2018 0823   GFRAA >60 03/20/2018 0823         RADIOGRAPHY: Ct Head Wo Contrast  Result Date: 03/15/2018 CLINICAL DATA:  Headache for several days EXAM: CT HEAD WITHOUT CONTRAST TECHNIQUE: Contiguous axial images were obtained  from the base of the skull through the vertex without intravenous contrast. COMPARISON:  None. FINDINGS: Brain: There is extensive vasogenic edema seen throughout much of the right temporal lobe with extension to the junctions of the posterior right frontal and anterior right temporal lobe superiorly and posteriorly to the right temporal occipital junction. There is loss of sulcal effacement in this area. There is slight impression on the right lateral ventricle with approximately 2 mm of midline shift toward the left. Ventricles overall are not enlarged. There is minimal midline shift to the left involving the third ventricle. The fourth ventricle is midline. There is no well-defined mass or hemorrhage. There are no subdural or epidural fluid collections. Note that the cisterna magna is prominent, a presumed anatomic variant. Vascular: No vascular lesions evident. There is edema impressing upon the posterior aspect of the right middle cerebral artery. There are foci of calcification in the carotid siphon regions bilaterally. Skull: The bony calvarium appears intact. Sinuses/Orbits: Visualized paranasal sinuses are clear. Patient has had cataract removals bilaterally. Orbits appear symmetric bilaterally. Other: Mastoid air cells are clear. IMPRESSION: Extensive right temporal lobe vasogenic edema with moderate mass effect and minimal midline shift toward the left. This appearance is not typical of infarct and is more concerning for potential mass or possibly abscess. MRI pre and post-contrast advised to further evaluate. No intracranial hemorrhage evident. No extra-axial fluid collections. There is arterial vascular calcification. These results were called by telephone at the time of interpretation on 03/15/2018 at 9:21 am to Dr. Kathlene November , who verbally acknowledged these results. Electronically Signed   By: Lowella Grip III M.D.   On: 03/15/2018 09:21   Mr [name] LT Contrast  Result Date:  03/21/2018 CLINICAL DATA:  Status post right temporal mass resection. EXAM: MRI HEAD WITHOUT AND WITH CONTRAST TECHNIQUE: Multiplanar, multiecho pulse sequences of the brain and surrounding structures were obtained without and with intravenous contrast. CONTRAST:  5.5 mL Gadavist COMPARISON:  Brain MRI 03/15/2018 FINDINGS: BRAIN: Status post resection of tumor of the right anterior temporal lobe. There is magnetic susceptibility effects and abnormal diffusion within the cavity, consistent with postoperative blood products. There is a wedge-shaped area of diffusion restriction extending posteriorly and medially toward the atrium of the right lateral ventricle. There is a large amount of anterior hydrocephalus with mild mass effect on the frontal poles. The extent of the surrounding hyperintense T2-weighted signal is unchanged. Mass effect on the ventricles has improved. There is a focus of chronic microhemorrhage adjacent to the occipital horn of the left lateral ventricle. At the resection site, there  is somewhat nodular contrast enhancement contiguous with the dura at the most anterior margin friend image 53 of the axial postcontrast T1-weighted sequence.). There are other mildly nodular areas of enhancement along the medial superior margin (series 11, images 65 and 66). Just anterior to the distal M1 segment of the right middle cerebral artery is a small focus of contrast enhancement (series 11, image 63). No remote contrast-enhancement. VASCULAR: Major intracranial arterial and venous sinus flow voids are normal. SKULL AND UPPER CERVICAL SPINE: Recent right pterional craniotomy. Small overlying subgaleal hematoma. SINUSES/ORBITS: No fluid levels or advanced mucosal thickening. No mastoid or middle ear effusion. There are bilateral lens replacements. IMPRESSION: 1. Status post resection of right temporal lobe tumor. This will serve as a baseline for future studies. 2. Areas of somewhat nodular contrast  enhancement along the anterior and medial resection margins are nonspecific at this time, but warrant close attention for the presence of residual tumor on the follow-up study. 3. Wedge-shaped area diffusion restriction extending dorsally and medially from the resection cavity, consistent with acute ischemia. Please note that on follow-up studies, this area may show contrast enhancement. 4. Postoperative findings including small subgaleal hematoma and moderate pneumocephalus. Electronically Signed   By: Ulyses Jarred M.D.   On: 03/21/2018 14:10   Mr [name] And Wo Contrast  Result Date: 03/15/2018 CLINICAL DATA:  84 year old female with abnormal right cerebral hemisphere on head CT this morning for headache. Vasogenic edema suspected. EXAM: MRI HEAD WITHOUT AND WITH CONTRAST TECHNIQUE: Multiplanar, multiecho pulse sequences of the brain and surrounding structures were obtained without and with intravenous contrast. CONTRAST:  5.5 milliliters Gadavist COMPARISON:  Head CT without contrast 0903 hours. Brain MRI 05/17/2015. FINDINGS: Brain: There is a large intra-axial heterogeneously enhancing mass in the right temporal lobe, lobulated with thick irregular peripheral enhancement and areas of central necrosis or cystic change. The lesion is 47 x 44 x 50 millimeters (AP by transverse by CC). The enhancing component extends to the cortical surface of the right temporal tip and is inseparable from the overlying dura which appears mildly thickened (series 14, image 67). The underlying parenchyma is heterogeneously T2 and FLAIR hyperintense. Confluent regional T2 and FLAIR hyperintensity does resemble vasogenic edema in some areas. The nonenhancing components of the mass are not restricted on diffusion, but diffusion is heterogeneous within the lesion. There is some hemosiderin/petechial hemorrhage within the mass. This lesion was not present in 2016. No other enhancing brain lesion. Mass effect results in partial  effacement of the right lateral ventricle with leftward midline shift of 5-6 millimeters. The the right temporal horn is displaced medially and the right ambient cistern is partially effaced, but other basilar cisterns remain normal. No ventriculomegaly. No ependymal enhancement. Elsewhere gray and white matter signal is stable since the 2016 MRI. No restricted diffusion to suggest acute infarction. No extra-axial collection or acute intracranial hemorrhage. Cervicomedullary junction and pituitary are within normal limits. Vascular: Major intracranial vascular flow voids are stable since 2016. There is mass effect on the right MCA branches. Skull and upper cervical spine: Negative visible cervical spine and spinal cord. Visualized bone marrow signal is within normal limits. Sinuses/Orbits: Stable and negative. Other: Mastoids remain clear. Visible internal auditory structures appear normal. Scalp and face soft tissues appear negative. IMPRESSION: 1. Solitary large 5 cm heterogeneously enhancing, partially necrotic intra-axial mass in the right temporal lobe. The mass extends to the surface of the anterior temporal tip and is inseparable from the overlying dura which appears mildly  thickened. Regional T2 and FLAIR hyperintensity tracking in the white matter could be vasogenic edema or nonenhancing tumor. Regional mass effect including effaced and medially displaced right temporal horn, mild leftward midline shift of 6 millimeters. 2. Burtis Junes a primary tumor such as Glioblastoma over a solitary metastasis. CNS lymphoma is also less likely. Electronically Signed   By: Genevie Ann M.D.   On: 03/15/2018 12:59      IMPRESSION/PLAN: Today, I talked to the patient and her family about the findings and work-up thus far. We discussed the patient's diagnosis of glioblastoma and the prognosis associated with this.  We discussed and general treatment for this, highlighting the role of radiotherapy in the management. We discussed  the available radiation techniques (including standard and hypofractionation), and focused on the details of logistics and delivery.    We discussed the risks, benefits, and side effects of radiotherapy. Side effects may include but not necessarily be limited to: fatigue, hair loss, cognitive decline, HA, nausea, seizures, hearing loss.  No guarantees of treatment were given. A consent form was signed and placed in the patient's medical record.  The patient was encouraged to ask questions that I answered to the best of my ability.  We will plan to simulate her radiotherapy early next week and design a treatment plan of 6 weeks of IMRT to the right temporal lobe with concurrent Temodar.  I will also make a referral to physical therapy for rehabilitation.    __________________________________________   Eppie Gibson, MD  This document serves as a record of services personally performed by Eppie Gibson, MD. It was created on her behalf by Rae Lips, a trained medical scribe. The creation of this record is based on the scribe's personal observations and the provider's statements to them. This document has been checked and approved by the attending provider.

## 2018-04-05 ENCOUNTER — Encounter: Payer: Self-pay | Admitting: Radiation Oncology

## 2018-04-05 ENCOUNTER — Telehealth: Payer: Self-pay

## 2018-04-05 ENCOUNTER — Ambulatory Visit
Admission: RE | Admit: 2018-04-05 | Discharge: 2018-04-05 | Disposition: A | Discharge status: Home / Self Care | Payer: Medicare Other | Source: Ambulatory Visit | Attending: Radiation Oncology | Admitting: Radiation Oncology

## 2018-04-05 ENCOUNTER — Inpatient Hospital Stay: Payer: Medicare Other | Attending: Internal Medicine | Admitting: Internal Medicine

## 2018-04-05 ENCOUNTER — Other Ambulatory Visit: Payer: Self-pay

## 2018-04-05 ENCOUNTER — Encounter (HOSPITAL_COMMUNITY): Payer: Self-pay

## 2018-04-05 ENCOUNTER — Inpatient Hospital Stay: Payer: Medicare Other | Admitting: Internal Medicine

## 2018-04-05 VITALS — BP 135/70 | HR 100 | Temp 97.6°F | Resp 17 | Ht 63.0 in | Wt 116.4 lb

## 2018-04-05 DIAGNOSIS — Z7982 Long term (current) use of aspirin: Secondary | ICD-10-CM | POA: Diagnosis not present

## 2018-04-05 DIAGNOSIS — C719 Malignant neoplasm of brain, unspecified: Secondary | ICD-10-CM

## 2018-04-05 DIAGNOSIS — C712 Malignant neoplasm of temporal lobe: Secondary | ICD-10-CM | POA: Insufficient documentation

## 2018-04-05 DIAGNOSIS — Z9221 Personal history of antineoplastic chemotherapy: Secondary | ICD-10-CM | POA: Diagnosis not present

## 2018-04-05 DIAGNOSIS — Z9889 Other specified postprocedural states: Secondary | ICD-10-CM | POA: Diagnosis not present

## 2018-04-05 DIAGNOSIS — Z79899 Other long term (current) drug therapy: Secondary | ICD-10-CM

## 2018-04-05 DIAGNOSIS — D496 Neoplasm of unspecified behavior of brain: Secondary | ICD-10-CM

## 2018-04-05 DIAGNOSIS — Z510 Encounter for antineoplastic radiation therapy: Secondary | ICD-10-CM | POA: Insufficient documentation

## 2018-04-05 MED ORDER — LEVETIRACETAM 250 MG PO TABS: 250.0000 mg | ORAL_TABLET | Freq: Two times a day (BID) | ORAL | 0 refills | Status: DC

## 2018-04-05 NOTE — Telephone Encounter (Signed)
Per 11/1 no los

## 2018-04-05 NOTE — Progress Notes (Signed)
[facility] [place], [place] [address] [phone]   New Patient Evaluation  Date of Service: 04/05/18 Patient Name: Marie Haynes Patient MRN: [id number] Patient DOB: April 11, 1932 Provider: Ventura Sellers, MD  Identifying Statement:  Marie Haynes is a 84 y.o. female with right temporal glioblastoma who presents for initial consultation and evaluation.    Referring Provider: Ashok Pall, MD [address], [place] [address]  Oncologic History:   Glioblastoma with isocitrate dehydrogenase gene wildtype ([place])   03/20/2018 Surgery    Craniotomy, resection with Dr. Christella Noa     Biomarkers:  MGMT Unknown.  IDH 1/2 Wild type.  EGFR Unknown  TERT Unknown   History of Present Illness: The patient's records from the referring physician were obtained and reviewed and the patient interviewed to confirm this HPI.  Victory Dakin Haddon presented to medical attention several weeks ago with new onset severe headache, which was a new complaint for her.  CT demonstrated a brain mass, and follow up MRI confirmed likely neoplasm.  After review by neurosurgery, she underwent craniotomy and resection of the mass with Dr. Christella Noa on 03/20/18.  She tolerated surgery well without any complaints or post-op deficits.  Since surgery she has been at home recovering, no recurrence of headaches.  One day last week she woke up "very confused" and unable to use left hand/arm in particular.  This gradually improved during the day until she was back at baseline by the evening; this "spell" has not recurred since that time.  She is currently off Keppra and did feel "drowsy" while on 553m twice per day.  She presents today to review pathology, prognosis, treatment options moving forward.  Medications: Current Outpatient Medications on File Prior to Visit  Medication Sig Dispense Refill  . Calcium Carbonate-Vitamin D (CALCIUM + D PO)  Take 3 tablets by mouth 2 (two) times daily.     . cholecalciferol (VITAMIN D) 400 UNITS TABS Take 4,000 Units by mouth daily.     .Marland Kitchendocusate sodium (COLACE) 100 MG capsule Take 100 mg by mouth 2 (two) times daily as needed for mild constipation.    . Glucosamine-Chondroit-Vit C-Mn (GLUCOSAMINE 1500 COMPLEX PO) Take 2 tablets by mouth 2 (two) times daily.      .Marland KitchenHYDROmorphone (DILAUDID) 2 MG tablet Take 1 tablet (2 mg total) by mouth every 4 (four) hours as needed for severe pain. 30 tablet 0  . levETIRAcetam (KEPPRA) 500 MG tablet Take 1 tablet (500 mg total) by mouth 2 (two) times daily. 60 tablet 0  . levothyroxine (SYNTHROID, LEVOTHROID) 75 MCG tablet Take 1 tablet (75 mcg total) by mouth daily before breakfast. 30 tablet 3  . magnesium 30 MG tablet Take 90 mg by mouth every evening.    . Red Yeast Rice Extract (RED YEAST RICE PO) Take 1 tablet by mouth daily.    . vitamin A 7500 UNIT capsule Take 7,500 Units by mouth daily.    . vitamin C (ASCORBIC ACID) 500 MG tablet Take 500 mg by mouth 2 (two) times daily.     . vitamin E 400 UNIT capsule Take 400 Units by mouth every other day.     . vitamin k 100 MCG tablet Take 100 mcg by mouth daily.       No current facility-administered medications on file prior to visit.     Allergies:  Allergies  Allergen Reactions  . Oxycodone-Acetaminophen Anaphylaxis and  Nausea And Vomiting  . Codeine Other (See Comments)    constipation   Past Medical History:  Past Medical History:  Diagnosis Date  . Anxiety and depression   . Constipation   . DJD (degenerative joint disease)   . Family history of adverse reaction to anesthesia    Mother - "shock" - due to anesthesia-   . Hypothyroidism   . Increased urinary frequency    Dr. Matilde Sprang  . LEG CRAMPS, NOCTURNAL 02/22/2007   Qualifier: Diagnosis of  By: Larose Kells MD, [place] [facility]   . Spinal stenosis   . Stress incontinence    (saw Urology 5-09)    . Symptomatic cholelithiasis 2017    reluctant to have surgery  . Urge and stress incontinence    Dr. Matilde Sprang   Past Surgical History:  Past Surgical History:  Procedure Laterality Date  . APPLICATION OF CRANIAL NAVIGATION N/A 03/20/2018   Procedure: APPLICATION OF CRANIAL NAVIGATION;  Surgeon: Ashok Pall, MD;  Location: [facility];  Service: Neurosurgery;  Laterality: N/A;  . BACK SURGERY  [phone]  . COLONOSCOPY    . CRANIOTOMY Right 03/20/2018   Procedure: Right Temporal craniotomy for tumor resection with brainlab;  Surgeon: Ashok Pall, MD;  Location: [facility];  Service: Neurosurgery;  Laterality: Right;  Right Temporal craniotomy for tumor resection with brainlab  . DILATION AND CURETTAGE OF UTERUS    . EYE SURGERY Bilateral    cataract   Social History:  Social History   Socioeconomic History  . Marital status: Married    Spouse name: Not on file  . Number of children: 3  . Years of education: Not on file  . Highest education level: Not on file  Occupational History  . Occupation: retired Pensions consultant: RETIRED  Social Needs  . Financial resource strain: Not on file  . Food insecurity:    Worry: Not on file    Inability: Not on file  . Transportation needs:    Medical: Not on file    Non-medical: Not on file  Tobacco Use  . Smoking status: Never Smoker  . Smokeless tobacco: Never Used  Substance and Sexual Activity  . Alcohol use: Yes    Comment: Rare  . Drug use: No  . Sexual activity: Not on file  Lifestyle  . Physical activity:    Days per week: Not on file    Minutes per session: Not on file  . Stress: Not on file  Relationships  . Social connections:    Talks on phone: Not on file    Gets together: Not on file    Attends religious service: Not on file    Active member of club or organization: Not on file    Attends meetings of clubs or organizations: Not on file    Relationship status: Not on file  . Intimate partner violence:    Fear of current or ex partner: Not on  file    Emotionally abused: Not on file    Physically abused: Not on file    Forced sexual activity: Not on file  Other Topics Concern  . Not on file  Social History Narrative   Household- pt and husband   Daughter bipolar   Family History:  Family History  Problem Relation Age of Onset  . Heart failure Mother   . Heart attack Mother 38       dx in her 11s  . Colon cancer Neg  Hx   . Breast cancer Neg Hx   . Diabetes Neg Hx     Review of Systems: Constitutional: Denies fevers, chills or abnormal weight loss Eyes: Denies blurriness of vision Ears, nose, mouth, throat, and face: Denies mucositis or sore throat Respiratory: Denies cough, dyspnea or wheezes Cardiovascular: Denies palpitation, chest discomfort or lower extremity swelling Gastrointestinal:  Denies nausea, constipation, diarrhea GU: Denies dysuria or incontinence Skin: Denies abnormal skin rashes Neurological: Per HPI Musculoskeletal: Denies joint pain, back or neck discomfort. No decrease in ROM Behavioral/Psych: Denies anxiety, disturbance in thought content, and mood instability  Physical Exam: Vitals:   04/05/18 1054  BP: 135/70  Pulse: 100  Resp: 17  Temp: 97.6 F (36.4 C)  SpO2: 100%   KPS: 80. General: Alert, cooperative, pleasant, in no acute distress Head: Craniotomy scar noted, dry and intact. EENT: No conjunctival injection or scleral icterus. Oral mucosa moist Lungs: Resp effort normal Cardiac: Regular rate and rhythm Abdomen: Soft, non-distended abdomen Skin: No rashes cyanosis or petechiae. Extremities: No clubbing or edema  Neurologic Exam: Mental Status: Awake, alert, attentive to examiner. Oriented to self and environment. Language is fluent with intact comprehension.  Cranial Nerves: Visual acuity is grossly normal. Visual fields are full. Extra-ocular movements intact. No ptosis. Face is symmetric, tongue midline. Motor: Tone and bulk are normal. Power is full in both arms and legs.  Reflexes are symmetric, no pathologic reflexes present. Intact finger to nose bilaterally Sensory: Intact to light touch and temperature Gait: Independent  Labs: I have reviewed the data as listed    Component Value Date/Time   NA 140 03/20/2018 0823   K 3.7 03/20/2018 0823   CL 109 03/20/2018 0823   CO2 23 03/20/2018 0823   GLUCOSE 99 03/20/2018 0823   BUN 14 03/20/2018 0823   CREATININE 0.72 03/20/2018 0823   CALCIUM 9.1 03/20/2018 0823   PROT 7.2 02/28/2018 1016   ALBUMIN 4.1 02/28/2018 1016   AST 17 02/28/2018 1016   ALT 17 02/28/2018 1016   ALKPHOS 44 02/28/2018 1016   BILITOT 0.6 02/28/2018 1016   GFRNONAA >60 03/20/2018 0823   GFRAA >60 03/20/2018 0823   Lab Results  Component Value Date   WBC 6.6 03/20/2018   NEUTROABS 3.5 03/15/2018   HGB 11.7 (L) 03/20/2018   HCT 38.8 03/20/2018   MCV 96.3 03/20/2018   PLT 254 03/20/2018    Imaging: Ct Head Wo Contrast  Result Date: 03/15/2018 CLINICAL DATA:  Headache for several days EXAM: CT HEAD WITHOUT CONTRAST TECHNIQUE: Contiguous axial images were obtained from the base of the skull through the vertex without intravenous contrast. COMPARISON:  None. FINDINGS: Brain: There is extensive vasogenic edema seen throughout much of the right temporal lobe with extension to the junctions of the posterior right frontal and anterior right temporal lobe superiorly and posteriorly to the right temporal occipital junction. There is loss of sulcal effacement in this area. There is slight impression on the right lateral ventricle with approximately 2 mm of midline shift toward the left. Ventricles overall are not enlarged. There is minimal midline shift to the left involving the third ventricle. The fourth ventricle is midline. There is no well-defined mass or hemorrhage. There are no subdural or epidural fluid collections. Note that the cisterna magna is prominent, a presumed anatomic variant. Vascular: No vascular lesions evident. There is  edema impressing upon the posterior aspect of the right middle cerebral artery. There are foci of calcification in the carotid siphon regions bilaterally.  Skull: The bony calvarium appears intact. Sinuses/Orbits: Visualized paranasal sinuses are clear. Patient has had cataract removals bilaterally. Orbits appear symmetric bilaterally. Other: Mastoid air cells are clear. IMPRESSION: Extensive right temporal lobe vasogenic edema with moderate mass effect and minimal midline shift toward the left. This appearance is not typical of infarct and is more concerning for potential mass or possibly abscess. MRI pre and post-contrast advised to further evaluate. No intracranial hemorrhage evident. No extra-axial fluid collections. There is arterial vascular calcification. These results were called by telephone at the time of interpretation on 03/15/2018 at 9:21 am to Dr. Kathlene November , who verbally acknowledged these results. Electronically Signed   By: Lowella Grip III M.D.   On: 03/15/2018 09:21   Mr [name] GU Contrast  Result Date: 03/21/2018 CLINICAL DATA:  Status post right temporal mass resection. EXAM: MRI HEAD WITHOUT AND WITH CONTRAST TECHNIQUE: Multiplanar, multiecho pulse sequences of the brain and surrounding structures were obtained without and with intravenous contrast. CONTRAST:  5.5 mL Gadavist COMPARISON:  Brain MRI 03/15/2018 FINDINGS: BRAIN: Status post resection of tumor of the right anterior temporal lobe. There is magnetic susceptibility effects and abnormal diffusion within the cavity, consistent with postoperative blood products. There is a wedge-shaped area of diffusion restriction extending posteriorly and medially toward the atrium of the right lateral ventricle. There is a large amount of anterior hydrocephalus with mild mass effect on the frontal poles. The extent of the surrounding hyperintense T2-weighted signal is unchanged. Mass effect on the ventricles has improved. There is a focus of  chronic microhemorrhage adjacent to the occipital horn of the left lateral ventricle. At the resection site, there is somewhat nodular contrast enhancement contiguous with the dura at the most anterior margin friend image 53 of the axial postcontrast T1-weighted sequence.). There are other mildly nodular areas of enhancement along the medial superior margin (series 11, images 65 and 66). Just anterior to the distal M1 segment of the right middle cerebral artery is a small focus of contrast enhancement (series 11, image 63). No remote contrast-enhancement. VASCULAR: Major intracranial arterial and venous sinus flow voids are normal. SKULL AND UPPER CERVICAL SPINE: Recent right pterional craniotomy. Small overlying subgaleal hematoma. SINUSES/ORBITS: No fluid levels or advanced mucosal thickening. No mastoid or middle ear effusion. There are bilateral lens replacements. IMPRESSION: 1. Status post resection of right temporal lobe tumor. This will serve as a baseline for future studies. 2. Areas of somewhat nodular contrast enhancement along the anterior and medial resection margins are nonspecific at this time, but warrant close attention for the presence of residual tumor on the follow-up study. 3. Wedge-shaped area diffusion restriction extending dorsally and medially from the resection cavity, consistent with acute ischemia. Please note that on follow-up studies, this area may show contrast enhancement. 4. Postoperative findings including small subgaleal hematoma and moderate pneumocephalus. Electronically Signed   By: Ulyses Jarred M.D.   On: 03/21/2018 14:10   Mr [name] And Wo Contrast  Result Date: 03/15/2018 CLINICAL DATA:  84 year old female with abnormal right cerebral hemisphere on head CT this morning for headache. Vasogenic edema suspected. EXAM: MRI HEAD WITHOUT AND WITH CONTRAST TECHNIQUE: Multiplanar, multiecho pulse sequences of the brain and surrounding structures were obtained without and with  intravenous contrast. CONTRAST:  5.5 milliliters Gadavist COMPARISON:  Head CT without contrast 0903 hours. Brain MRI 05/17/2015. FINDINGS: Brain: There is a large intra-axial heterogeneously enhancing mass in the right temporal lobe, lobulated with thick irregular peripheral enhancement and areas of  central necrosis or cystic change. The lesion is 47 x 44 x 50 millimeters (AP by transverse by CC). The enhancing component extends to the cortical surface of the right temporal tip and is inseparable from the overlying dura which appears mildly thickened (series 14, image 67). The underlying parenchyma is heterogeneously T2 and FLAIR hyperintense. Confluent regional T2 and FLAIR hyperintensity does resemble vasogenic edema in some areas. The nonenhancing components of the mass are not restricted on diffusion, but diffusion is heterogeneous within the lesion. There is some hemosiderin/petechial hemorrhage within the mass. This lesion was not present in 2016. No other enhancing brain lesion. Mass effect results in partial effacement of the right lateral ventricle with leftward midline shift of 5-6 millimeters. The the right temporal horn is displaced medially and the right ambient cistern is partially effaced, but other basilar cisterns remain normal. No ventriculomegaly. No ependymal enhancement. Elsewhere gray and white matter signal is stable since the 2016 MRI. No restricted diffusion to suggest acute infarction. No extra-axial collection or acute intracranial hemorrhage. Cervicomedullary junction and pituitary are within normal limits. Vascular: Major intracranial vascular flow voids are stable since 2016. There is mass effect on the right MCA branches. Skull and upper cervical spine: Negative visible cervical spine and spinal cord. Visualized bone marrow signal is within normal limits. Sinuses/Orbits: Stable and negative. Other: Mastoids remain clear. Visible internal auditory structures appear normal. Scalp and  face soft tissues appear negative. IMPRESSION: 1. Solitary large 5 cm heterogeneously enhancing, partially necrotic intra-axial mass in the right temporal lobe. The mass extends to the surface of the anterior temporal tip and is inseparable from the overlying dura which appears mildly thickened. Regional T2 and FLAIR hyperintensity tracking in the white matter could be vasogenic edema or nonenhancing tumor. Regional mass effect including effaced and medially displaced right temporal horn, mild leftward midline shift of 6 millimeters. 2. Burtis Junes a primary tumor such as Glioblastoma over a solitary metastasis. CNS lymphoma is also less likely. Electronically Signed   By: Genevie Ann M.D.   On: 03/15/2018 12:59    Pathology:    Assessment/Plan Glioblastoma with isocitrate dehydrogenase gene wildtype ([place])   Ms. Alling is clinically stable, still recovering from her craniotomy and resection.  We reviewed her pathology today in detail with family present.  She has good functional status, relative lack of comorbidity, good quality surgery, and non-focal exam.  Because of favorable profile she would likely stratify into full 6 weeks of radiation and temozolomide as the next step in treatment.  We recommended initiating radiation therapy with concurrent temozolomide dosed at 51m/m2 for 42 days.  Radiation is likely to start in ~2 weeks.  We reviewed side effects of temozolomide, including fatigue, constipation, cytopenias.  Chemotherapy should be held for the following:  ANC less than 1,000  Platelets less than 100,000  LFT or creatinine greater than 2x ULN  If clinical concerns/contraindications develop  We will continue to follow every 2 weeks during radiation with labs for evaluation.    We also recommended resuming Keppra at 2574mBID for seizure prevention, given lack of clarity surrounding her sudden decline/event this past week, which could have been nocturnal seizure(s).  We appreciate the  opportunity to participate in the care of [facility]  Screening for potential clinical trials was performed and discussed using eligibility criteria for active protocols [facility], as well as national database available on [email or web address]   We discussed her candidacy for the [id number]:  Testing Ramipril to Prevent Memory Loss in People with Glioblastoma. The patient is a candidate and is interested in pursuing this research protocol.    Referral will be made to this study, available here at [facility], through research nurse, Morene Crocker.    We spent twenty additional minutes teaching regarding the natural history, biology, and historical experience in the treatment of brain tumors. We then discussed in detail the current recommendations for therapy focusing on the mode of administration, mechanism of action, anticipated toxicities, and quality of life issues associated with this plan. We also provided teaching sheets for the patient to take home as an additional resource.  All questions were answered. The patient knows to call the clinic with any problems, questions or concerns. No barriers to learning were detected.  The total time spent in the encounter was 60 minutes and more than 50% was on counseling and review of test results   Ventura Sellers, MD Medical Director of Neuro-Oncology [facility] [place] 04/05/18 10:28 AM

## 2018-04-08 ENCOUNTER — Encounter: Payer: Self-pay | Admitting: Radiation Oncology

## 2018-04-08 ENCOUNTER — Other Ambulatory Visit: Payer: Self-pay | Admitting: Radiation Oncology

## 2018-04-08 ENCOUNTER — Ambulatory Visit
Admission: RE | Admit: 2018-04-08 | Discharge: 2018-04-08 | Disposition: A | Discharge status: Home / Self Care | Payer: Medicare Other | Source: Ambulatory Visit | Attending: Radiation Oncology | Admitting: Radiation Oncology

## 2018-04-08 ENCOUNTER — Other Ambulatory Visit: Payer: Self-pay | Admitting: Internal Medicine

## 2018-04-08 DIAGNOSIS — Z510 Encounter for antineoplastic radiation therapy: Secondary | ICD-10-CM | POA: Diagnosis not present

## 2018-04-08 DIAGNOSIS — C712 Malignant neoplasm of temporal lobe: Secondary | ICD-10-CM | POA: Insufficient documentation

## 2018-04-08 DIAGNOSIS — C719 Malignant neoplasm of brain, unspecified: Secondary | ICD-10-CM

## 2018-04-08 MED ORDER — ONDANSETRON HCL 8 MG PO TABS: 8.0000 mg | ORAL_TABLET | Freq: Two times a day (BID) | ORAL | 1 refills | Status: DC | PRN

## 2018-04-08 MED ORDER — LEVETIRACETAM 250 MG PO TABS: 250.0000 mg | ORAL_TABLET | Freq: Two times a day (BID) | ORAL | 3 refills | Status: DC

## 2018-04-08 MED ORDER — TEMOZOLOMIDE 100 MG PO CAPS: 100.0000 mg | ORAL_CAPSULE | Freq: Every day | ORAL | 0 refills | Status: DC

## 2018-04-08 NOTE — Progress Notes (Signed)
START ON PATHWAY REGIMEN - Neuro     One cycle, daily for 42 days concurrent with RT:     Temozolomide   **Always confirm dose/schedule in your pharmacy ordering system**  Patient Characteristics: Glioblastoma, Newly Diagnosed / Treatment Naive, Good Performance Status and/or Younger Patient, MGMT Promoter Unmethylated/Unknown Disease Classification: Glioma Disease Classification: Glioblastoma Disease Status: Newly Diagnosed / Treatment Naive Performance Status: Good Performance Status and/or Younger Patient MGMT Promoter Methylation Status: Awaiting Test Results Intent of Therapy: Non-Curative / Palliative Intent, Discussed with Patient 

## 2018-04-09 ENCOUNTER — Ambulatory Visit: Payer: Medicare Other | Admitting: Radiation Oncology

## 2018-04-09 ENCOUNTER — Other Ambulatory Visit: Payer: Self-pay | Admitting: Radiation Oncology

## 2018-04-09 ENCOUNTER — Telehealth: Payer: Self-pay | Admitting: *Deleted

## 2018-04-09 ENCOUNTER — Telehealth: Payer: Self-pay | Admitting: Medical Oncology

## 2018-04-09 DIAGNOSIS — C712 Malignant neoplasm of temporal lobe: Secondary | ICD-10-CM

## 2018-04-09 NOTE — Telephone Encounter (Signed)
[id number] Dr. Mickeal Skinner spoke to patient about study at her last clinic visit and patient had expressed interest in study. I called patient x2 today in an attempt to introduce myself and talk to her about the study more as well as answer any questions she may. I called patient's cell phone number (x2) and home number (x2) and was unable to leave vm due to mailbox full or no option to leave a vm. Will try again tomorrow.  Maxwell Marion, RN, BSN, [facility] Clinical Research 04/09/2018 4:35 PM

## 2018-04-09 NOTE — Telephone Encounter (Signed)
Per Dr Mickeal Skinner patient needs to have appointments for lab week 2, 4,6 during chemo/radiation.  Created appointments and called and left message on husbands phone to advise

## 2018-04-10 ENCOUNTER — Telehealth: Payer: Self-pay | Admitting: Medical Oncology

## 2018-04-10 ENCOUNTER — Telehealth: Payer: Self-pay | Admitting: *Deleted

## 2018-04-10 ENCOUNTER — Telehealth: Payer: Self-pay | Admitting: Pharmacist

## 2018-04-10 DIAGNOSIS — Z510 Encounter for antineoplastic radiation therapy: Secondary | ICD-10-CM | POA: Diagnosis not present

## 2018-04-10 DIAGNOSIS — C712 Malignant neoplasm of temporal lobe: Secondary | ICD-10-CM | POA: Diagnosis not present

## 2018-04-10 NOTE — Telephone Encounter (Signed)
CALLED PATIENT TO INFORM OF AUDIOLOGIST ON 04-18-18 - WITH JOANNA MEREDITH- ARRIVAL TIME - 2 PM, ADDRESS- [place] UNABLE TO LEAVE MESSAGE, MAILED APPT. CARD

## 2018-04-10 NOTE — Telephone Encounter (Signed)
Oral Oncology Pharmacist Encounter  Received new prescription for Temodar (temozolomide) for the treatment of glioblastoma multiforme, IDH-WT in conjunction with radiation, planned duration 6 weeks of concurrent chemoradiation. Patient may continue on adjuvant treatment with Temodar monotherapy for an additional 6-12 months.  Labs from Epic assessed, [place] for treatment.  Temodar will be dosed at ~75 mg/m2 once daily for 42 days straight of concurrent chemoradiation phase.  Current medication list in Epic reviewed, no DDIs with Temodar identified.  Prescription has been e-scribed to the [facility] for benefits analysis and approval.  Oral Oncology Clinic will continue to follow for insurance authorization, copayment issues, initial counseling and start date.  Johny Drilling, PharmD, BCPS, BCOP  04/10/2018 2:09 PM Oral Oncology Clinic [phone]

## 2018-04-10 NOTE — Telephone Encounter (Signed)
LVMOM with spouse cell phone, regarding Dr. Renda Rolls referral of patient to the [id number] study Testing Ramipril to prevent memory loss in people with glioblastoma. Introduced myself and asked for patient or spouse to return call to follow-up with any questions they may have or interest in study. Informed them that if patient is interested in study, we would need to start the screening/eligibility process soon because patient would need to start study drug on day of first radiation treatment (scheduled for 04/15/18). Eligibility is pending at this time. Spouse thanked and my contact information was provided.  I called patient's cell phone and home phone listed, again, but there was no option to leave a message.  Maxwell Marion, RN, BSN, [facility] Clinical Research 04/10/2018 11:29 AM

## 2018-04-10 NOTE — Telephone Encounter (Signed)
[id number]: Testing Ramipril to Prevent Memory Loss in People with glioblastoma I was able to speak with patient by calling her husbands cell phone number. I introduced myself and inquired with her if she had any questions regarding the study after speaking to Dr. Mickeal Skinner about it. Patient asked what the study drug was and what the side effects were related to study drug. I gave patient the name of the study drug, Ramipril, and informed her of the side effects, as are listed in the study consent form. I also provided patient with information with the doses and the scheduling of when the study drug is taken. Patient was also informed that her full eligibility is not know at this time and if she is interested in study and is available to meet for a consent review and continues to be interested after the consent review and signing, we would need to draw baseline labs for confirmation of eligibility. Patient gave verbal understanding and stated she could come in tomorrow to review consent. Patient and I made arrangements to meet at 1:30 for review of consent. All patient's questions answered to her satisfaction, patient was thanked for her time and my contact information was provided to her should she have any questions before we meet tomorrow.  Maxwell Marion, RN, BSN, [facility] Clinical Research 04/10/2018 1:47 PM

## 2018-04-11 ENCOUNTER — Other Ambulatory Visit: Payer: Self-pay | Admitting: Medical Oncology

## 2018-04-11 ENCOUNTER — Other Ambulatory Visit: Payer: Self-pay

## 2018-04-11 ENCOUNTER — Inpatient Hospital Stay: Payer: Medicare Other

## 2018-04-11 ENCOUNTER — Encounter: Payer: Self-pay | Admitting: Medical Oncology

## 2018-04-11 ENCOUNTER — Inpatient Hospital Stay: Payer: Medicare Other | Admitting: Medical Oncology

## 2018-04-11 ENCOUNTER — Ambulatory Visit: Payer: Medicare Other | Attending: Radiation Oncology | Admitting: Physical Therapy

## 2018-04-11 DIAGNOSIS — R262 Difficulty in walking, not elsewhere classified: Secondary | ICD-10-CM | POA: Diagnosis not present

## 2018-04-11 DIAGNOSIS — C719 Malignant neoplasm of brain, unspecified: Secondary | ICD-10-CM | POA: Diagnosis not present

## 2018-04-11 DIAGNOSIS — R293 Abnormal posture: Secondary | ICD-10-CM | POA: Insufficient documentation

## 2018-04-11 DIAGNOSIS — M6281 Muscle weakness (generalized): Secondary | ICD-10-CM | POA: Diagnosis not present

## 2018-04-11 DIAGNOSIS — Z9221 Personal history of antineoplastic chemotherapy: Secondary | ICD-10-CM | POA: Diagnosis not present

## 2018-04-11 DIAGNOSIS — Z483 Aftercare following surgery for neoplasm: Secondary | ICD-10-CM | POA: Insufficient documentation

## 2018-04-11 DIAGNOSIS — Z79899 Other long term (current) drug therapy: Secondary | ICD-10-CM | POA: Diagnosis not present

## 2018-04-11 DIAGNOSIS — Z7982 Long term (current) use of aspirin: Secondary | ICD-10-CM | POA: Diagnosis not present

## 2018-04-11 LAB — CBC WITH DIFFERENTIAL (CANCER CENTER ONLY)
Abs Immature Granulocytes: 0.02 10*3/uL (ref 0.00–0.07)
Basophils Absolute: 0 10*3/uL (ref 0.0–0.1)
Basophils Relative: 0 %
Eosinophils Absolute: 0.2 10*3/uL (ref 0.0–0.5)
Eosinophils Relative: 4 %
HCT: 32.3 % — ABNORMAL LOW (ref 36.0–46.0)
HEMOGLOBIN: 10.1 g/dL — AB (ref 12.0–15.0)
Immature Granulocytes: 0 %
LYMPHS PCT: 23 %
Lymphs Abs: 1.2 10*3/uL (ref 0.7–4.0)
MCH: 30.2 pg (ref 26.0–34.0)
MCHC: 31.3 g/dL (ref 30.0–36.0)
MCV: 96.7 fL (ref 80.0–100.0)
MONO ABS: 0.5 10*3/uL (ref 0.1–1.0)
MONOS PCT: 10 %
Neutro Abs: 3.2 10*3/uL (ref 1.7–7.7)
Neutrophils Relative %: 63 %
Platelet Count: 313 10*3/uL (ref 150–400)
RBC: 3.34 MIL/uL — AB (ref 3.87–5.11)
RDW: 13.7 % (ref 11.5–15.5)
WBC: 5.1 10*3/uL (ref 4.0–10.5)
nRBC: 0 % (ref 0.0–0.2)

## 2018-04-11 LAB — RESEARCH LABS

## 2018-04-11 NOTE — Therapy (Signed)
[place], [place], [address] Phone: [phone]   Fax:  [phone]  Physical Therapy Evaluation  Patient Details  Name: Marie Haynes MRN: [id number] Date of Birth: 1932/05/20 Referring Provider (PT): Dr. Isidore Moos   Encounter Date: 04/11/2018  PT End of Session - 04/11/18 1754    Visit Number  1    Number of Visits  9    Date for PT Re-Evaluation  05/11/18    PT Start Time  0160    PT Stop Time  1600    PT Time Calculation (min)  45 min    Activity Tolerance  Patient tolerated treatment well    Behavior During Therapy  [facility] for tasks assessed/performed       Past Medical History:  Diagnosis Date  . Anxiety and depression   . Constipation   . DJD (degenerative joint disease)   . Family history of adverse reaction to anesthesia    Mother - "shock" - due to anesthesia-   . Hypothyroidism   . Increased urinary frequency    Dr. Matilde Sprang  . LEG CRAMPS, NOCTURNAL 02/22/2007   Qualifier: Diagnosis of  By: Larose Kells MD, [place] [facility]oporosis   . Spinal stenosis   . Stress incontinence    (saw Urology 5-09)    . Symptomatic cholelithiasis 2017   reluctant to have surgery  . Urge and stress incontinence    Dr. Matilde Sprang    Past Surgical History:  Procedure Laterality Date  . APPLICATION OF CRANIAL NAVIGATION N/A 03/20/2018   Procedure: APPLICATION OF CRANIAL NAVIGATION;  Surgeon: Ashok Pall, MD;  Location: [facility];  Service: Neurosurgery;  Laterality: N/A;  . BACK SURGERY  [phone]  . COLONOSCOPY    . CRANIOTOMY Right 03/20/2018   Procedure: Right Temporal craniotomy for tumor resection with brainlab;  Surgeon: Ashok Pall, MD;  Location: [facility];  Service: Neurosurgery;  Laterality: Right;  Right Temporal craniotomy for tumor resection with brainlab  . DILATION AND CURETTAGE OF UTERUS    . EYE SURGERY Bilateral    cataract    There were no vitals filed for this visit.   Subjective  Assessment - 04/11/18 1742    Subjective  pt says she wants to make sure she is walking ok  she has not been using the 2 wheeled walker for the past 3 days     Pertinent History  pt with right temporal glioblasatoma with craniotomy  03/20/2018.  she plans to start radiation and chemo next week Past history includes osteoporosis and spinal stenosis     Patient Stated Goals  to make sure she is walking ok     Currently in Pain?  No/denies         [facility] PT Assessment - 04/11/18 0001      Assessment   Medical Diagnosis  right temporal glioblastoma s/p craniotomy    Referring Provider (PT)  Dr. Isidore Moos    Onset Date/Surgical Date  03/20/18      Precautions   Precautions  Fall      Restrictions   Weight Bearing Restrictions  No      Balance Screen   Has the patient fallen in the past 6 months  No    Has the patient had a decrease in activity level because of a fear of falling?   No    Is the patient reluctant to leave their home because of a fear of falling?   No  Home Environment   Living Environment  Private residence    Living Arrangements  Spouse/significant other    Available Help at Discharge  Family   currently staying with their oldest son and daughter in law    Type of [place] to enter    [place]  One level    [place] - 2 wheels;Shower seat    Additional Comments  --      Prior Function   Level of Independence  Needs assistance with ADLs   assist with showering    Vocation  Retired    Leisure  has exercised in a regular basis in the past  but is not now  her granddaughter wants her to walk outside more    Comments  pt states she wants to go with her husband  at [employer]   Overall Cognitive Status  Within Functional Limits for tasks assessed      Observation/Other Assessments   Observations  pt comes in with her husband walking with no assitive device  Pt is thin  and appears frail, but walks at a quick pace. Her husband accompanies her       Sensation   Light Touch  Appears Intact      Coordination   Gross Motor Movements are Fluid and Coordinated  Yes      Functional Tests   Functional tests  Sit to Stand      Sit to Stand   Comments  7 in 30 sec  Pt did not push up with arms    pt appears fatigued at end     Posture/Postural Control   Posture/Postural Control  Postural limitations    Postural Limitations  Rounded Shoulders;Forward head;Increased thoracic kyphosis      ROM / Strength   AROM / PROM / Strength  AROM;Strength      AROM   Overall AROM   Within functional limits for tasks performed      Strength   Overall Strength  Deficits    Overall Strength Comments  Pt appears to have generalized weakness.  She reports fatigue during the day and is happy to have 2 supine rest breaks during our session today     Right Hand Grip (lbs)  15   average   Left Hand Grip (lbs)  25   average     Bed Mobility   Bed Mobility  Rolling Right;Right Sidelying to Sit    Rolling Right  Independent    Right Sidelying to Sit  Independent      Transfers   Transfers  Sit to Stand;Stand to Sit;Supine to Sit;Sit to Supine    Sit to Stand  7: Independent    Stand to Sit  7: Independent    Supine to Sit  7: Independent    Sit to Supine  7: Independent      Ambulation/Gait   Gait velocity  5 meters in 5.34 seconds or .93 m/s which is a Hydrographic surveyor but not sure how long pt could keep that pace up as she fatigues easily       Standardized Balance Assessment   Standardized Balance Assessment  Timed Up and Go Test      Timed Up and Go Test   Normal TUG (seconds)  17    TUG Comments  pt did not use  device but did push up from chair arms. pt should be able to walk without a gait aid according to normative data interpretation       High Level Balance   High Level Balance Comments  gait velocity for 10 meters 5.34 sec                  Objective measurements completed on examination: See above findings.                   PT Long Term Goals - 04/11/18 1550      PT LONG TERM GOAL #1   Title  pt wants to be able to stand for 5 minutes unsupported so that she can help  prepare a small meal     Time  4    Period  Weeks    Status  New      PT LONG TERM GOAL #2   Title  Pt will be able to do 10 sit to stands in 30 seconds as an indication of increased general strength     Time  4    Period  Weeks    Status  New      PT LONG TERM GOAL #3   Title  pt will decrease TUG score to < 14 indicating an improvment in functional mobility     Baseline  17    Time  4    Period  Weeks    Status  New      PT LONG TERM GOAL #4   Title  Pt verbalize a strategy for managing treatment related fatigue     Time  4    Period  Weeks    Status  New      PT LONG TERM GOAL #5   Title  Pt will be independent in a home exercise program for general strength and endurance     Time  4    Period  Weeks    Status  New             Plan - 04/11/18 1754    Clinical Impression Statement  84 yo female s/p craniotomy who will be starting chemo and radiation treatment for glioblastoma.  She is thin and appears frail, but has good functional mobility. She has limited endurance. She will benefit from PT for a general strengthening program and instruction in a home exercise progam.     History and Personal Factors relevant to plan of care:  recent surgery. advanced age.    Clinical Presentation  Evolving    Clinical Presentation due to:  will have chemo and radiation     Clinical Decision Making  Moderate    Rehab Potential  Good    PT Frequency  2x / week    PT Duration  4 weeks    PT Treatment/Interventions  ADLs/Self Care Home Management;DME Instruction;Balance training;Therapeutic exercise;Therapeutic activities;Functional mobility training;Stair training;Gait training;Patient/family education    PT  Next Visit Plan  Do 6 minute walk test and Berg Balance Scale for more specific areas to focus exercise on.  Teach multicomponent HEP. for strength and endurance     Consulted and Agree with Plan of Care  Patient       Patient will benefit from skilled therapeutic intervention in order to improve the following deficits and impairments:  Abnormal gait, Postural dysfunction, Decreased endurance, Decreased activity tolerance, Decreased strength, Difficulty walking, Decreased knowledge of precautions, Decreased balance  Visit Diagnosis: Aftercare following  surgery for neoplasm - Plan: PT plan of care cert/re-cert  Muscle weakness (generalized) - Plan: PT plan of care cert/re-cert  Abnormal posture - Plan: PT plan of care cert/re-cert  Difficulty in walking - Plan: PT plan of care cert/re-cert     Problem List Patient Active Problem List   Diagnosis Date Noted  . Cancer of temporal lobe ([place]) 04/08/2018  . 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 Follow-up -------------PCP NOTES 02/10/2015  . Annual physical exam 09/04/2012  . Hyperlipidemia, mild 09/03/2012  . Anxiety and depression 06/23/2011  . SKIN LESION 03/25/2010  . Osteoarthritis 12/12/2007  . Hypothyroidism 02/22/2007  . SPINAL STENOSIS 02/22/2007  . LEG CRAMPS, NOCTURNAL 02/22/2007  . Osteoporosis 02/22/2007   Donato Heinz. Owens Shark PT  Norwood Levo 04/11/2018, 6:05 PM  [place], [place], [address] Phone: [phone]   Fax:  [phone]  Name: Marie Haynes MRN: [id number] Date of Birth: 14-May-1932

## 2018-04-11 NOTE — Telephone Encounter (Signed)
Oral Chemotherapy Pharmacist Encounter   Attempted to reach patient and husband, Rolan Bucco, to provide update and offer for initial counseling on oral medication: Temodar.  No answer. Left VM  On husband's cell phone for patient to call back for medication acquisition coordination and initial counseling. Patient's voicemail was full, no answer on home number.  Prior authorization was not required by patient's insurance. Only 30 day supply can be dispensed at a time from the pharmacy.   Copayment for Temodar 100mg  capsules quantity #30 = $92.52 Copayment for Temodar 100mg  capsules quantity #12 remaining = $39.89  This information will be shared with patient and caregivers when we are able to speak.  Johny Drilling, PharmD, BCPS, BCOP  04/11/2018   1:03 PM Oral Oncology Clinic [phone]

## 2018-04-12 ENCOUNTER — Inpatient Hospital Stay: Payer: Medicare Other

## 2018-04-12 ENCOUNTER — Other Ambulatory Visit: Payer: Self-pay | Admitting: Medical Oncology

## 2018-04-12 DIAGNOSIS — Z79899 Other long term (current) drug therapy: Secondary | ICD-10-CM | POA: Diagnosis not present

## 2018-04-12 DIAGNOSIS — C719 Malignant neoplasm of brain, unspecified: Secondary | ICD-10-CM | POA: Diagnosis not present

## 2018-04-12 DIAGNOSIS — Z7982 Long term (current) use of aspirin: Secondary | ICD-10-CM | POA: Diagnosis not present

## 2018-04-12 DIAGNOSIS — Z9221 Personal history of antineoplastic chemotherapy: Secondary | ICD-10-CM | POA: Diagnosis not present

## 2018-04-12 LAB — CMP (CANCER CENTER ONLY)
ALK PHOS: 50 U/L (ref 38–126)
ALT: 9 U/L (ref 0–44)
ALT: 9 U/L (ref 0–44)
AST: 12 U/L — AB (ref 15–41)
AST: 12 U/L — AB (ref 15–41)
Albumin: 3.2 g/dL — ABNORMAL LOW (ref 3.5–5.0)
Albumin: 3.2 g/dL — ABNORMAL LOW (ref 3.5–5.0)
Alkaline Phosphatase: 52 U/L (ref 38–126)
Anion gap: 6 (ref 5–15)
Anion gap: 9 (ref 5–15)
BUN: 16 mg/dL (ref 8–23)
BUN: 16 mg/dL (ref 8–23)
CALCIUM: 9.5 mg/dL (ref 8.9–10.3)
CHLORIDE: 105 mmol/L (ref 98–111)
CO2: 29 mmol/L (ref 22–32)
CO2: 27 mmol/L (ref 22–32)
Calcium: 9.5 mg/dL (ref 8.9–10.3)
Chloride: 105 mmol/L (ref 98–111)
Creatinine: 0.68 mg/dL (ref 0.44–1.00)
Creatinine: 0.67 mg/dL (ref 0.44–1.00)
GFR, Est AFR Am: >60 mL/min (ref >60)
GFR, Est AFR Am: >60 mL/min (ref >60)
GFR, Estimated: >60 mL/min (ref >60)
GLUCOSE: 92 mg/dL (ref 70–99)
Glucose, Bld: 90 mg/dL (ref 70–99)
Potassium: 5.5 mmol/L — ABNORMAL HIGH (ref 3.5–5.1)
Potassium: 4 mmol/L (ref 3.5–5.1)
Sodium: 140 mmol/L (ref 135–145)
Sodium: 141 mmol/L (ref 135–145)
TOTAL PROTEIN: 6.4 g/dL — AB (ref 6.5–8.1)
Total Protein: 6.7 g/dL (ref 6.5–8.1)

## 2018-04-12 NOTE — Telephone Encounter (Signed)
Oral Chemotherapy Pharmacist Encounter   I spoke with patient and husband, Rolan Bucco, for overview of: Temodar for the treatment of glioblastoma multiforme in conjunction with radiation, planned duration concomitant phase 42 days of therapy.  Patient will likely continue on Temodar for maintenance treatment for 6-12 cycles after completion of concomitant phase.  Counseled patient on administration, dosing, side effects, monitoring, drug-food interactions, safe handling, storage, and disposal.  Patient will take Temodar 100mg  capsules, 1 capsule (100mg ) by mouth once daily, may take at bedtime and on an empty stomach to decrease nausea and vomiting.  Patient will take Temodar concurrent with radiation for 42 days straight.  Temodar and radiation start date: 04/15/2018   Patient will take Zofran 8mg  tablet, 1 tablet by mouth 30-60 min prior to Temodar dose to help decrease N/V once starting adjuvant therapy. Prophylactic Zofran will not be used at initiation of concurrent phase.   Adverse effects include but are not limited to: nausea, vomiting, anorexia, GI upset, rash, drug fever, and fatigue. Rare but serious adverse effects of pneumocystis pneumonia and secondary malignancy also discussed.  PCP prophylaxis will not be initiated at this time, but may be added based on lymphocyte count in the future.  Reviewed with patient importance of keeping a medication schedule and plan for any missed doses.  Mr. and Mrs. Ureste voiced understanding and appreciation.   All questions answered. Medication reconciliation performed and medication/allergy list updated.  They will pick up the first 30-day supply of Temodar 100 mg capsules from the [facility] on Monday, 04/15/2018, after the first radiation treatment. Temodar will be started that night before bed. The remaining #12 capsules will be coordinated for acquisition once claim can be run at the pharmacy.  Patient knows to  call the office with questions or concerns. Oral Oncology Clinic will continue to follow.  Johny Drilling, PharmD, BCPS, BCOP  04/12/2018  10:58 AM Oral Oncology Clinic [phone]

## 2018-04-15 ENCOUNTER — Other Ambulatory Visit: Payer: Self-pay | Admitting: Medical Oncology

## 2018-04-15 ENCOUNTER — Encounter: Payer: Self-pay | Admitting: Internal Medicine

## 2018-04-15 ENCOUNTER — Inpatient Hospital Stay: Payer: Medicare Other | Admitting: Medical Oncology

## 2018-04-15 ENCOUNTER — Encounter: Payer: Self-pay | Admitting: Medical Oncology

## 2018-04-15 ENCOUNTER — Ambulatory Visit
Admission: RE | Admit: 2018-04-15 | Discharge: 2018-04-15 | Disposition: A | Discharge status: Home / Self Care | Payer: Medicare Other | Source: Ambulatory Visit | Attending: Radiation Oncology | Admitting: Radiation Oncology

## 2018-04-15 DIAGNOSIS — C712 Malignant neoplasm of temporal lobe: Secondary | ICD-10-CM

## 2018-04-15 DIAGNOSIS — C719 Malignant neoplasm of brain, unspecified: Secondary | ICD-10-CM

## 2018-04-15 DIAGNOSIS — Z510 Encounter for antineoplastic radiation therapy: Secondary | ICD-10-CM | POA: Diagnosis not present

## 2018-04-15 MED ORDER — SONAFINE EX EMUL
1.0000 "application " | Freq: Two times a day (BID) | CUTANEOUS | Status: DC
  Administered 2018-04-15: 1 via TOPICAL

## 2018-04-15 MED FILL — TEMOZOLOMIDE 100 MG CAPS: 100 | 30 days supply | Qty: 30 | Fill #0

## 2018-04-15 NOTE — Progress Notes (Signed)
[id number]: Baseline I met with patient, spouse and their grand-daughter today in clinic for completion of patient's baseline assessments (prior to her start of radiation). I reviewed patient's history as well as medications again with her to make sure that no new OTC supplements had been started. Patient completed her baseline neurocognitive assessment with research Remer Macho, as well as her questionnaires. Baseline AE's reviewed and documented. Patient was dispensed study research medication of Ramipril, bottle containing 133 capsules, RX # [id number], Lot # [id number], exp 02/03/19, dispensed by PharmD Meredith Leeds, provided by the study and given to patient by this nurse.  Study drug Ramipril was reviewed with both patient and husband in detail. They both confirm knowing to take medication at bedtime, either with or without food. Along with the study medication, patient was also provided the study Adminstration schedule for Study Medication and medication diaries for documenting, through the end of December, of self administration. Patient and spouse verbalize understanding that she is to start first dosage tonight, taking 1 capsule for the next seven days and to document on her medication diary whether she took one or didn't. It was reviewed with patient and spouse what to do should she miss a dose, what medicines and or supplements to avoid and not take, the expected adverse events as well as when they should call the doctor. The both gave verbal understanding to these medication instructions.  Concomitant Medications: Reviewed and patient states that she had stopped taking her vitamin supplements/supplements since her diagnosis.  Labs: collected and resulted on 11/08 Adverse Events: Baseline- blurred vision (gr 1) and general weakness (gr 1) Patient's spouse had a photo copy example of patient's writing since her surgery that he asked me to give to Dr. Mickeal Skinner, this was given to his nurse Rhae Hammock.  Patient  to have study labs drawn on Friday 11/15, and to be resulted by the 18th, for titration to next dose (week 2), pending results.  All patient and spouses questions answered to their satisfaction. Both were thanked for their time and their contribution to study and were encouraged to call me with questions.  Maxwell Marion, RN, BSN, [facility] Clinical Research 04/15/2018 3:58 PM

## 2018-04-16 ENCOUNTER — Ambulatory Visit
Admission: RE | Admit: 2018-04-16 | Discharge: 2018-04-16 | Disposition: A | Discharge status: Home / Self Care | Payer: Medicare Other | Source: Ambulatory Visit | Attending: Radiation Oncology | Admitting: Radiation Oncology

## 2018-04-16 DIAGNOSIS — Z510 Encounter for antineoplastic radiation therapy: Secondary | ICD-10-CM | POA: Diagnosis not present

## 2018-04-16 DIAGNOSIS — C712 Malignant neoplasm of temporal lobe: Secondary | ICD-10-CM | POA: Diagnosis not present

## 2018-04-16 NOTE — Telephone Encounter (Signed)
Oral Oncology Patient Advocate Encounter  Confirmed with [facility] that Temodar was picked up on 04/15/18 with a $0 copay   [place] Patient [place] Phone [phone] Fax [phone]

## 2018-04-17 ENCOUNTER — Other Ambulatory Visit: Payer: Self-pay | Admitting: Internal Medicine

## 2018-04-17 ENCOUNTER — Ambulatory Visit
Admission: RE | Admit: 2018-04-17 | Discharge: 2018-04-17 | Disposition: A | Discharge status: Home / Self Care | Payer: Medicare Other | Source: Ambulatory Visit | Attending: Radiation Oncology | Admitting: Radiation Oncology

## 2018-04-17 ENCOUNTER — Ambulatory Visit: Payer: Medicare Other | Admitting: Rehabilitation

## 2018-04-17 ENCOUNTER — Encounter: Payer: Self-pay | Admitting: Rehabilitation

## 2018-04-17 DIAGNOSIS — C712 Malignant neoplasm of temporal lobe: Secondary | ICD-10-CM | POA: Diagnosis not present

## 2018-04-17 DIAGNOSIS — R293 Abnormal posture: Secondary | ICD-10-CM

## 2018-04-17 DIAGNOSIS — M6281 Muscle weakness (generalized): Secondary | ICD-10-CM | POA: Diagnosis not present

## 2018-04-17 DIAGNOSIS — Z510 Encounter for antineoplastic radiation therapy: Secondary | ICD-10-CM | POA: Diagnosis not present

## 2018-04-17 DIAGNOSIS — Z483 Aftercare following surgery for neoplasm: Secondary | ICD-10-CM

## 2018-04-17 DIAGNOSIS — R262 Difficulty in walking, not elsewhere classified: Secondary | ICD-10-CM | POA: Diagnosis not present

## 2018-04-17 MED ORDER — LEVOTHYROXINE SODIUM 75 MCG PO TABS: 75.0000 ug | ORAL_TABLET | Freq: Every day | ORAL | 3 refills | Status: DC

## 2018-04-17 NOTE — Therapy (Signed)
[place], [place], [address] Phone: [phone]   Fax:  [phone]  Physical Therapy Treatment  Patient Details  Name: Marie Haynes MRN: [id number] Date of Birth: 03/01/1932 Referring Provider (PT): Dr. Isidore Moos   Encounter Date: 04/17/2018  PT End of Session - 04/17/18 0917    Visit Number  2    Number of Visits  9    Date for PT Re-Evaluation  05/11/18    PT Start Time  0845    PT Stop Time  0925    PT Time Calculation (min)  40 min    Activity Tolerance  Patient tolerated treatment well;Patient limited by fatigue       Past Medical History:  Diagnosis Date  . Anxiety and depression   . Constipation   . DJD (degenerative joint disease)   . Family history of adverse reaction to anesthesia    Mother - "shock" - due to anesthesia-   . Hypothyroidism   . Increased urinary frequency    Dr. Matilde Sprang  . LEG CRAMPS, NOCTURNAL 02/22/2007   Qualifier: Diagnosis of  By: Larose Kells MD, [place] Osteoporosis   . Spinal stenosis   . Stress incontinence    (saw Urology 5-09)    . Symptomatic cholelithiasis 2017   reluctant to have surgery  . Urge and stress incontinence    Dr. Matilde Sprang    Past Surgical History:  Procedure Laterality Date  . APPLICATION OF CRANIAL NAVIGATION N/A 03/20/2018   Procedure: APPLICATION OF CRANIAL NAVIGATION;  Surgeon: Ashok Pall, MD;  Location: [facility];  Service: Neurosurgery;  Laterality: N/A;  . BACK SURGERY  [phone]  . COLONOSCOPY    . CRANIOTOMY Right 03/20/2018   Procedure: Right Temporal craniotomy for tumor resection with brainlab;  Surgeon: Ashok Pall, MD;  Location: [facility];  Service: Neurosurgery;  Laterality: Right;  Right Temporal craniotomy for tumor resection with brainlab  . DILATION AND CURETTAGE OF UTERUS    . EYE SURGERY Bilateral    cataract    There were no vitals filed for this visit.  Subjective Assessment - 04/17/18 0846    Subjective   I have had 2 radiation appointments so far.  The mask is just not comfortable.  I am not feeling as well today.  My stomach is upset    Pertinent History  pt with right temporal glioblasatoma with craniotomy  03/20/2018.  she plans to start radiation and chemo next week Past history includes osteoporosis and spinal stenosis     Patient Stated Goals  to make sure she is walking ok     Currently in Pain?  No/denies         [facility] PT Assessment - 04/17/18 0001      6 Minute Walk- Baseline   6 Minute Walk- Baseline  yes    HR (bpm)  75    02 Sat (%RA)  98 %    Modified Borg Scale for Dyspnea  0- Nothing at all      6 Minute walk- Post Test   6 Minute Walk Post Test  yes    HR (bpm)  82    02 Sat (%RA)  97 %    Modified Borg Scale for Dyspnea  0- Nothing at all      6 minute walk test results    Aerobic Endurance Distance Walked  834    Endurance additional comments  noAD      Standardized  Balance Assessment   Standardized Balance Assessment  Berg Balance Test      Berg Balance Test   Sit to Stand  Able to stand without using hands and stabilize independently    Standing Unsupported  Able to stand safely 2 minutes    Sitting with Back Unsupported but Feet Supported on Floor or Stool  Able to sit safely and securely 2 minutes    Stand to Sit  Sits safely with minimal use of hands    Transfers  Able to transfer safely, minor use of hands    Standing Unsupported with Eyes Closed  Able to stand 10 seconds safely    Standing Ubsupported with Feet Together  Able to place feet together independently and stand for 1 minute with supervision    From Standing, Reach Forward with Outstretched Arm  Can reach confidently >25 cm (10")    From Standing Position, Pick up Object from [place] to pick up shoe safely and easily    From Standing Position, Turn to Look Behind Over each Shoulder  Looks behind from both sides and weight shifts well    Turn 360 Degrees  Able to turn 360 degrees safely in  4 seconds or less    Standing Unsupported, Alternately Place Feet on Step/Stool  Able to complete 4 steps without aid or supervision   on the first attempt pt had LOB to the left due to double vi   Standing Unsupported, One Foot in Front  Able to take small step independently and hold 30 seconds   12 sec only in the corner but not needing to step    Standing on One Leg  Able to lift leg independently and hold 5-10 seconds    Total Score  50    Berg comment:  needing rest break x 1                    [facility] Adult PT Treatment/Exercise - 04/17/18 0001      Exercises   Exercises  Knee/Hip      Knee/Hip Exercises: Aerobic   Stationary Bike  able to do 1:15 before getting too tired                  PT Long Term Goals - 04/17/18 0912      Additional Long Term Goals   Additional Long Term Goals  Yes      PT LONG TERM GOAL #6   Title  Pt will improve berg balance score to 55 or greater    Time  4    Period  Weeks    Status  New            Plan - 04/17/18 0920    Clinical Impression Statement  Marie Haynes performed the 53min walk test today with an average distance walked for age matched norms, slightly on the lower side, no SOB experienced.  Pt scoring 50/56 with a moderate fall risk.  new goal was made.  Most difficulty is with narrow BOS testing.  Pt also noticed that she still has double vision if she looks down., like at the stairs.  stopped today after 1:15 on the bike with fatigue limiting     PT Frequency  2x / week    PT Duration  4 weeks    PT Treatment/Interventions  ADLs/Self Care Home Management;DME Instruction;Balance training;Therapeutic exercise;Therapeutic activities;Functional mobility training;Stair training;Gait training;Patient/family education    PT Next Visit Plan  begin endurance,  general strength, and balance work     Oncologist with Plan of Care  Patient       Patient will benefit from skilled therapeutic intervention in order  to improve the following deficits and impairments:  Abnormal gait, Postural dysfunction, Decreased endurance, Decreased activity tolerance, Decreased strength, Difficulty walking, Decreased knowledge of precautions, Decreased balance  Visit Diagnosis: Aftercare following surgery for neoplasm  Muscle weakness (generalized)  Abnormal posture  Difficulty in walking     Problem List Patient Active Problem List   Diagnosis Date Noted  . Cancer of temporal lobe ([place]) 04/08/2018  . 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 Follow-up -------------PCP NOTES 02/10/2015  . Annual physical exam 09/04/2012  . Hyperlipidemia, mild 09/03/2012  . Anxiety and depression 06/23/2011  . SKIN LESION 03/25/2010  . Osteoarthritis 12/12/2007  . Hypothyroidism 02/22/2007  . SPINAL STENOSIS 02/22/2007  . LEG CRAMPS, NOCTURNAL 02/22/2007  . Osteoporosis 02/22/2007    Donovan Kail, PT 04/17/2018, 9:30 AM  [place], [place], [address] Phone: [phone]   Fax:  [phone]  Name: Marie Haynes MRN: [id number] Date of Birth: 05-17-1932

## 2018-04-18 ENCOUNTER — Ambulatory Visit
Admission: RE | Admit: 2018-04-18 | Discharge: 2018-04-18 | Disposition: A | Discharge status: Home / Self Care | Payer: Medicare Other | Source: Ambulatory Visit | Attending: Radiation Oncology | Admitting: Radiation Oncology

## 2018-04-18 DIAGNOSIS — Z510 Encounter for antineoplastic radiation therapy: Secondary | ICD-10-CM | POA: Diagnosis not present

## 2018-04-18 DIAGNOSIS — C712 Malignant neoplasm of temporal lobe: Secondary | ICD-10-CM | POA: Diagnosis not present

## 2018-04-18 DIAGNOSIS — H903 Sensorineural hearing loss, bilateral: Secondary | ICD-10-CM | POA: Diagnosis not present

## 2018-04-18 DIAGNOSIS — H9193 Unspecified hearing loss, bilateral: Secondary | ICD-10-CM | POA: Diagnosis not present

## 2018-04-19 ENCOUNTER — Encounter: Payer: Self-pay | Admitting: Rehabilitation

## 2018-04-19 ENCOUNTER — Inpatient Hospital Stay: Payer: Medicare Other

## 2018-04-19 ENCOUNTER — Ambulatory Visit
Admission: RE | Admit: 2018-04-19 | Discharge: 2018-04-19 | Disposition: A | Discharge status: Home / Self Care | Payer: Medicare Other | Source: Ambulatory Visit | Attending: Radiation Oncology | Admitting: Radiation Oncology

## 2018-04-19 ENCOUNTER — Ambulatory Visit: Payer: Medicare Other | Admitting: Rehabilitation

## 2018-04-19 ENCOUNTER — Encounter: Payer: Self-pay | Admitting: *Deleted

## 2018-04-19 DIAGNOSIS — Z483 Aftercare following surgery for neoplasm: Secondary | ICD-10-CM

## 2018-04-19 DIAGNOSIS — M6281 Muscle weakness (generalized): Secondary | ICD-10-CM

## 2018-04-19 DIAGNOSIS — R293 Abnormal posture: Secondary | ICD-10-CM | POA: Diagnosis not present

## 2018-04-19 DIAGNOSIS — Z510 Encounter for antineoplastic radiation therapy: Secondary | ICD-10-CM | POA: Diagnosis not present

## 2018-04-19 DIAGNOSIS — C719 Malignant neoplasm of brain, unspecified: Secondary | ICD-10-CM

## 2018-04-19 DIAGNOSIS — R262 Difficulty in walking, not elsewhere classified: Secondary | ICD-10-CM | POA: Diagnosis not present

## 2018-04-19 DIAGNOSIS — C712 Malignant neoplasm of temporal lobe: Secondary | ICD-10-CM | POA: Diagnosis not present

## 2018-04-19 LAB — RESEARCH LABS

## 2018-04-19 NOTE — Therapy (Signed)
[place], [place], [address] Phone: [phone]   Fax:  [phone]  Physical Therapy Treatment  Patient Details  Name: Marie Haynes MRN: [id number] Date of Birth: 12/05/1931 Referring Provider (PT): Dr. Isidore Moos   Encounter Date: 04/19/2018  PT End of Session - 04/19/18 1058    Visit Number  3    Number of Visits  9    Date for PT Re-Evaluation  05/11/18    PT Start Time  1055    PT Stop Time  1145    PT Time Calculation (min)  50 min    Activity Tolerance  Patient tolerated treatment well    Behavior During Therapy  [facility] for tasks assessed/performed       Past Medical History:  Diagnosis Date  . Anxiety and depression   . Constipation   . DJD (degenerative joint disease)   . Family history of adverse reaction to anesthesia    Mother - "shock" - due to anesthesia-   . Hypothyroidism   . Increased urinary frequency    Dr. Matilde Sprang  . LEG CRAMPS, NOCTURNAL 02/22/2007   Qualifier: Diagnosis of  By: Larose Kells MD, [place] Osteoporosis   . Spinal stenosis   . Stress incontinence    (saw Urology 5-09)    . Symptomatic cholelithiasis 2017   reluctant to have surgery  . Urge and stress incontinence    Dr. Matilde Sprang    Past Surgical History:  Procedure Laterality Date  . APPLICATION OF CRANIAL NAVIGATION N/A 03/20/2018   Procedure: APPLICATION OF CRANIAL NAVIGATION;  Surgeon: Ashok Pall, MD;  Location: [place];  Service: Neurosurgery;  Laterality: N/A;  . BACK SURGERY  [phone]  . COLONOSCOPY    . CRANIOTOMY Right 03/20/2018   Procedure: Right Temporal craniotomy for tumor resection with brainlab;  Surgeon: Ashok Pall, MD;  Location: [place];  Service: Neurosurgery;  Laterality: Right;  Right Temporal craniotomy for tumor resection with brainlab  . DILATION AND CURETTAGE OF UTERUS    . EYE SURGERY Bilateral    cataract    There were no vitals filed for this visit.  Subjective  Assessment - 04/19/18 1052    Subjective  I'm just really tired from radiation    Pertinent History  pt with right temporal glioblasatoma with craniotomy  03/20/2018.  she plans to start radiation and chemo next week Past history includes osteoporosis and spinal stenosis     Currently in Pain?  No/denies                       [facility] Adult PT Treatment/Exercise - 04/19/18 0001      Neuro Re-ed    Neuro Re-ed Details   in parallel bars: tandem stance work bil, tandem walking x 4 lengths, heel raises x 10 and marching x 10 all with hand hold on bars as needed.  LOB backwards with heel raise x3 needing PT minA.  Sl stance work bilateral, standing on blue foam with pt swaying alot posteriorly needing PT minA multiple times.  blue foam step up x 5 bil CGA      Exercises   Exercises  Knee/Hip;Other Exercises    Other Exercises   given handout on diaphragmatic breathing after pt has questions about this from something she read      Knee/Hip Exercises: Aerobic   Nustep  level 3 UE/LE x       Knee/Hip Exercises: Seated  Long Arc [employer]  Both;10 reps;Weights    Long Arc Quad Weight  2 lbs.    Long [employer] Limitations  3" holds seated on blue pad to raise height     Ball Squeeze  6"x10 purple ball             PT Education - 04/19/18 1147    Education Details  given handout on diaphragmatic breathing after pt has questions about this from something she read    Person(s) Educated  Patient    Methods  Explanation;Demonstration;Tactile cues;Verbal cues;Handout    Comprehension  Verbalized understanding;Returned demonstration;Verbal cues required          PT Long Term Goals - 04/17/18 0912      Additional Long Term Goals   Additional Long Term Goals  Yes      PT LONG TERM GOAL #6   Title  Pt will improve berg balance score to 55 or greater    Time  4    Period  Weeks    Status  New            Plan - 04/19/18 1151    Clinical Impression Statement  began LE  strength, balance, gait training, and endurance today.  Pt enjoyed the Nustep and reports feeling more energy post treatment.  Some increased Rt hip pain with LAQ but pt was lifting the whole leg off of the chair.      PT Frequency  2x / week    PT Duration  4 weeks    PT Treatment/Interventions  ADLs/Self Care Home Management;DME Instruction;Balance training;Therapeutic exercise;Therapeutic activities;Functional mobility training;Stair training;Gait training;Patient/family education    PT Next Visit Plan  begin endurance, general strength, and balance work     Oncologist with Plan of Care  Patient       Patient will benefit from skilled therapeutic intervention in order to improve the following deficits and impairments:  Abnormal gait, Postural dysfunction, Decreased endurance, Decreased activity tolerance, Decreased strength, Difficulty walking, Decreased knowledge of precautions, Decreased balance  Visit Diagnosis: Aftercare following surgery for neoplasm  Muscle weakness (generalized)  Abnormal posture  Difficulty in walking     Problem List Patient Active Problem List   Diagnosis Date Noted  . Cancer of temporal lobe ([place]) 04/08/2018  . 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 Follow-up -------------PCP NOTES 02/10/2015  . Annual physical exam 09/04/2012  . Hyperlipidemia, mild 09/03/2012  . Anxiety and depression 06/23/2011  . SKIN LESION 03/25/2010  . Osteoarthritis 12/12/2007  . Hypothyroidism 02/22/2007  . SPINAL STENOSIS 02/22/2007  . LEG CRAMPS, NOCTURNAL 02/22/2007  . Osteoporosis 02/22/2007    Shan Levans, PT 04/19/2018, 11:54 AM  [place], [place], [address] Phone: [phone]   Fax:  [phone]  Name: Marie Haynes MRN: [id number] Date of Birth: February 16, 1932

## 2018-04-19 NOTE — Research (Signed)
[id number]: Met with patient's husband while patient was in Radiation treatment. Introduced myself as Tour manager for [employer].  Asked husband if patient is having any trouble taking the study medication.  He replied patient is not having any trouble and has taken one pill at bedtime for the past 4 nights as directed. Reminded husband of lab appointment today for research study.  Instructed him to check in again in main lobby for the lab appointment.  Patient may leave after blood draw.  Instructed husband for patient to continue to take study drug once a day at bedtime. Asked husband to have patient bring in study drug bottle and medication diary on Monday. Research nurse will assess patient on Monday and provide further instructions about the study drug. Husband verbalized understanding. Thanked him for his time today and encouraged to call if any questions or concerns before next visit.  Foye Spurling, BSN, RN Clinical Research Nurse 04/19/2018 1:15 PM

## 2018-04-22 ENCOUNTER — Ambulatory Visit
Admission: RE | Admit: 2018-04-22 | Discharge: 2018-04-22 | Disposition: A | Discharge status: Home / Self Care | Payer: Medicare Other | Source: Ambulatory Visit | Attending: Radiation Oncology | Admitting: Radiation Oncology

## 2018-04-22 ENCOUNTER — Encounter: Payer: Self-pay | Admitting: Internal Medicine

## 2018-04-22 ENCOUNTER — Other Ambulatory Visit: Payer: Self-pay | Admitting: Medical Oncology

## 2018-04-22 ENCOUNTER — Encounter: Payer: Self-pay | Admitting: Medical Oncology

## 2018-04-22 DIAGNOSIS — Z510 Encounter for antineoplastic radiation therapy: Secondary | ICD-10-CM | POA: Diagnosis not present

## 2018-04-22 DIAGNOSIS — C719 Malignant neoplasm of brain, unspecified: Secondary | ICD-10-CM

## 2018-04-22 DIAGNOSIS — C712 Malignant neoplasm of temporal lobe: Secondary | ICD-10-CM | POA: Diagnosis not present

## 2018-04-22 MED ORDER — INV-RAMIPRIL 1.25 MG CAP WF-1801 (#133): 1.2500 mg | ORAL_CAPSULE | Freq: Every day | ORAL | 0 refills | Status: DC

## 2018-04-22 NOTE — Progress Notes (Signed)
[id number]: Ramipril: End of Week 1, Start of Week 2 I met with patient and spouse this morning, here for her scheduled radiation treatment. We met just prior to her appointment and I collected study medication Ramipril and medication diaries for review and count, while patient was receiving RT. Patient's blood pressure was taken in RT and at 148/70 today. Patient confirms to be taking her Temodar daily as instructed.  Ramipril: Patient returned study bottle with 126 capsules. Capsule count verified with PharmD Raul Del.  Medication Diaries: Reviewed for documentation of self-administration and patient reports no missed doses.  Concomitant Medications: Reviewed and patient states that she had started taking OTC ginger capsules for nausea ( 11/14). Patient also states to have started taking Aspirin 81 mg (11/17) on the advice of her PCP. I had reviewed with patient what medications to avoid and Aspirin is on the list, patient asking since it is a low dosage, is it a problem. I informed patient that I will confirm with study and call her back, but in the meantime to not take anymore. Labs: Labs were collected on 11/15 and sent out to [facility] and I have received results this morning, with no concerning results.  Adverse Events:Patient reports intermittent nausea (gr 1) that started on the 11/11th and is taking OTC ginger capsules for it and reports that this is helping.  Patient with WNL assessment and within parameters to titrate to 2 capsules, for a total of 2.72m in the evening, review with Dr. VMickeal Skinner patient was informed to increase dosage of Ramipril to 2 capsules starting tonight for 7 days and to continue documenting self administration. I reviewed with patient and spouse, again, what to do should a dose be missed, what medications to avoid and and the expected adverse events as well as when to call Dr. VMickeal Skinneror myself. I returned patient's medication diary, along with the study medication containing  126 capsules and a new Administration schedule for study medication (Ramipril) noting the start date of 11/18-11/24. Teach back method was used with patient and spouse.  Patient to have study labs drawn on Friday 11/22, so as to have result for next titration dose (week 3). All patient and spouses questions answered to their satisfaction. Both were thanked for their time and their continued contribution to study and were encouraged to call Dr. VMickeal Skinneror myself with any questions or concerns they may have.  MMaxwell Marion RN, BSN, CAdventist Health ClearlakeClinical Research 04/22/2018 12:57 PM   CNariyah OsiasRoyal [phone] 04/22/2018  Adverse Event Log  Study/Protocol: WF 1801(Ramipril) Cycle: End of week 1- start of week 2 Baseline- blurred vision (gr 1) and general weakness (gr 1)  Event Grade Onset Date Resolved Date Drug Name Attribution Treatment Comments  Nausea (intermittent)  1 04/15/18 ongoing Ramilpril unrelated OTC ginger capsules

## 2018-04-23 ENCOUNTER — Telehealth: Payer: Self-pay | Admitting: Medical Oncology

## 2018-04-23 ENCOUNTER — Ambulatory Visit
Admission: RE | Admit: 2018-04-23 | Discharge: 2018-04-23 | Disposition: A | Discharge status: Home / Self Care | Payer: Medicare Other | Source: Ambulatory Visit | Attending: Radiation Oncology | Admitting: Radiation Oncology

## 2018-04-23 DIAGNOSIS — Z510 Encounter for antineoplastic radiation therapy: Secondary | ICD-10-CM | POA: Diagnosis not present

## 2018-04-23 DIAGNOSIS — C712 Malignant neoplasm of temporal lobe: Secondary | ICD-10-CM | POA: Diagnosis not present

## 2018-04-23 DIAGNOSIS — C719 Malignant neoplasm of brain, unspecified: Secondary | ICD-10-CM

## 2018-04-23 NOTE — Telephone Encounter (Signed)
[id number] LVMOM with patient and spouse regarding Aspirin 81 mg, that patient had started taking. Patient reported that she started it on the 17th. I had asked patient to hold the Aspirin untill I could get a clarification with Dr. Mickeal Skinner and study whether it is of concern with Ramipril. Per Dr. Mauri Reading and study, patient ok to continue with 81 mg Aspirin and we will monitor closely. LVMOM with patient regarding this information and she can continue with the low-dose Asprin . Return contact number provided, patient encouraged to call with questions or concerns.  Maxwell Marion, RN, BSN, [facility] Clinical Research 04/23/2018 4:22 PM

## 2018-04-24 ENCOUNTER — Encounter: Payer: Self-pay | Admitting: Physical Therapy

## 2018-04-24 ENCOUNTER — Ambulatory Visit
Admission: RE | Admit: 2018-04-24 | Discharge: 2018-04-24 | Disposition: A | Discharge status: Home / Self Care | Payer: Medicare Other | Source: Ambulatory Visit | Attending: Radiation Oncology | Admitting: Radiation Oncology

## 2018-04-24 ENCOUNTER — Ambulatory Visit: Payer: Medicare Other | Admitting: Physical Therapy

## 2018-04-24 DIAGNOSIS — R262 Difficulty in walking, not elsewhere classified: Secondary | ICD-10-CM

## 2018-04-24 DIAGNOSIS — R293 Abnormal posture: Secondary | ICD-10-CM | POA: Diagnosis not present

## 2018-04-24 DIAGNOSIS — Z483 Aftercare following surgery for neoplasm: Secondary | ICD-10-CM

## 2018-04-24 DIAGNOSIS — C712 Malignant neoplasm of temporal lobe: Secondary | ICD-10-CM | POA: Diagnosis not present

## 2018-04-24 DIAGNOSIS — Z510 Encounter for antineoplastic radiation therapy: Secondary | ICD-10-CM | POA: Diagnosis not present

## 2018-04-24 DIAGNOSIS — M6281 Muscle weakness (generalized): Secondary | ICD-10-CM | POA: Diagnosis not present

## 2018-04-24 NOTE — Therapy (Signed)
[place], [place], [address] Phone: [phone]   Fax:  [phone]  Physical Therapy Treatment  Patient Details  Name: Marie Haynes MRN: [id number] Date of Birth: 02/10/1932 Referring Provider (PT): Dr. Isidore Moos   Encounter Date: 04/24/2018  PT End of Session - 04/24/18 1343    Visit Number  4    Number of Visits  9    Date for PT Re-Evaluation  05/11/18    PT Start Time  1300    PT Stop Time  1343    PT Time Calculation (min)  43 min    Activity Tolerance  Patient tolerated treatment well    Behavior During Therapy  [facility] for tasks assessed/performed       Past Medical History:  Diagnosis Date  . Anxiety and depression   . Constipation   . DJD (degenerative joint disease)   . Family history of adverse reaction to anesthesia    Mother - "shock" - due to anesthesia-   . Hypothyroidism   . Increased urinary frequency    Dr. Matilde Sprang  . LEG CRAMPS, NOCTURNAL 02/22/2007   Qualifier: Diagnosis of  By: Larose Kells MD, [place] Osteoporosis   . Spinal stenosis   . Stress incontinence    (saw Urology 5-09)    . Symptomatic cholelithiasis 2017   reluctant to have surgery  . Urge and stress incontinence    Dr. Matilde Sprang    Past Surgical History:  Procedure Laterality Date  . APPLICATION OF CRANIAL NAVIGATION N/A 03/20/2018   Procedure: APPLICATION OF CRANIAL NAVIGATION;  Surgeon: Ashok Pall, MD;  Location: [place];  Service: Neurosurgery;  Laterality: N/A;  . BACK SURGERY  [phone]  . COLONOSCOPY    . CRANIOTOMY Right 03/20/2018   Procedure: Right Temporal craniotomy for tumor resection with brainlab;  Surgeon: Ashok Pall, MD;  Location: [place];  Service: Neurosurgery;  Laterality: Right;  Right Temporal craniotomy for tumor resection with brainlab  . DILATION AND CURETTAGE OF UTERUS    . EYE SURGERY Bilateral    cataract    There were no vitals filed for this visit.  Subjective  Assessment - 04/24/18 1302    Subjective  Pt says she is tired.  She is also having problems with her blurry vision.  She has an appointment with Dr. Mickeal Skinner     Pertinent History  pt with right temporal glioblasatoma with craniotomy  03/20/2018.  she plans to start radiation and chemo next week Past history includes osteoporosis and spinal stenosis                        [facility] Adult PT Treatment/Exercise - 04/24/18 0001      Ambulation/Gait   Ambulation/Gait  Yes    Gait Comments  pt ambulated for ~ 5 minutes with stops/ starts/ change in direction .Pt with mild dyspnea with a few balance losses and O2 sats decreased to 86 % temporarily.  Increase to 98% within a minute       High Level Balance   High Level Balance Activities  Side stepping;Backward walking;Direction changes;Turns;Sudden stops;Head turns      Neuro Re-ed    Neuro Re-ed Details   standing step back and weight shift back with dorsiflex on front foot for 30 seonds with each foot.  sitting on red disc for core stablity and abdominal work also ant post and lateral pelvic work  also did 10 purple  ball squeezes and 10 red theraband hip abduciton with pt holding ends of band       Exercises   Exercises  Shoulder;Elbow;Hand;Knee/Hip;Ankle      Elbow Exercises   Elbow Flexion  Strengthening;20 reps;Seated;Bar weights/barbell    Bar Weights/Barbell (Elbow Flexion)  1 lb      Knee/Hip Exercises: Standing   Other Standing Knee Exercises  standing knee raise with ipsilateral arm raise on the wall x 10 reps on each side       Shoulder Exercises: Standing   Row  Strengthening;Right;Left;20 reps;Theraband   both arms together and alternating with cues to stand tall      Hand Exercises   Other Hand Exercises  squeeze cardboard tube for full arm activation x 10 with each side       Ankle Exercises: Seated   Heel Raises  Right;Left;10 reps                  PT Long Term Goals - 04/17/18 0912       Additional Long Term Goals   Additional Long Term Goals  Yes      PT LONG TERM GOAL #6   Title  Pt will improve berg balance score to 55 or greater    Time  4    Period  Weeks    Status  New            Plan - 04/24/18 1343    Clinical Impression Statement  Focused on core and LE strength today with walking balance challenges. Pt benefited from frequent cuing for core extension.  She is showing some O2 desaturation to 86% with extended walking, but recovers to 98% quickly     PT Treatment/Interventions  ADLs/Self Care Home Management;DME Instruction;Balance training;Therapeutic exercise;Therapeutic activities;Functional mobility training;Stair training;Gait training;Patient/family education    PT Next Visit Plan  continue endurance, general strength, and balance work        Patient will benefit from skilled therapeutic intervention in order to improve the following deficits and impairments:  Abnormal gait, Postural dysfunction, Decreased endurance, Decreased activity tolerance, Decreased strength, Difficulty walking, Decreased knowledge of precautions, Decreased balance  Visit Diagnosis: Aftercare following surgery for neoplasm  Muscle weakness (generalized)  Abnormal posture  Difficulty in walking     Problem List Patient Active Problem List   Diagnosis Date Noted  . Cancer of temporal lobe ([place]) 04/08/2018  . 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 Follow-up -------------PCP NOTES 02/10/2015  . Annual physical exam 09/04/2012  . Hyperlipidemia, mild 09/03/2012  . Anxiety and depression 06/23/2011  . SKIN LESION 03/25/2010  . Osteoarthritis 12/12/2007  . Hypothyroidism 02/22/2007  . SPINAL STENOSIS 02/22/2007  . LEG CRAMPS, NOCTURNAL 02/22/2007  . Osteoporosis 02/22/2007   Marie Haynes. Owens Shark PT  Norwood Levo 04/24/2018, 4:23 PM  [place], [place], [address] Phone: [phone]   Fax:  [phone]  Name: Marie Haynes MRN: [id number] Date of Birth: [birth year]

## 2018-04-25 ENCOUNTER — Encounter: Payer: Self-pay | Admitting: Internal Medicine

## 2018-04-25 ENCOUNTER — Inpatient Hospital Stay: Payer: Medicare Other

## 2018-04-25 ENCOUNTER — Ambulatory Visit
Admission: RE | Admit: 2018-04-25 | Discharge: 2018-04-25 | Disposition: A | Discharge status: Home / Self Care | Payer: Medicare Other | Source: Ambulatory Visit | Attending: Radiation Oncology | Admitting: Radiation Oncology

## 2018-04-25 ENCOUNTER — Inpatient Hospital Stay (HOSPITAL_BASED_OUTPATIENT_CLINIC_OR_DEPARTMENT_OTHER): Payer: Medicare Other | Admitting: Internal Medicine

## 2018-04-25 VITALS — BP 143/62 | HR 73 | Temp 98.2°F | Resp 18 | Ht 63.0 in | Wt 119.3 lb

## 2018-04-25 DIAGNOSIS — C719 Malignant neoplasm of brain, unspecified: Secondary | ICD-10-CM | POA: Diagnosis not present

## 2018-04-25 DIAGNOSIS — Z9221 Personal history of antineoplastic chemotherapy: Secondary | ICD-10-CM

## 2018-04-25 DIAGNOSIS — Z510 Encounter for antineoplastic radiation therapy: Secondary | ICD-10-CM | POA: Diagnosis not present

## 2018-04-25 DIAGNOSIS — C712 Malignant neoplasm of temporal lobe: Secondary | ICD-10-CM | POA: Diagnosis not present

## 2018-04-25 DIAGNOSIS — Z7982 Long term (current) use of aspirin: Secondary | ICD-10-CM | POA: Diagnosis not present

## 2018-04-25 DIAGNOSIS — Z79899 Other long term (current) drug therapy: Secondary | ICD-10-CM | POA: Diagnosis not present

## 2018-04-25 LAB — CBC WITH DIFFERENTIAL (CANCER CENTER ONLY)
Abs Immature Granulocytes: 0.02 K/uL (ref 0.00–0.07)
Basophils Absolute: 0 K/uL (ref 0.0–0.1)
Basophils Relative: 1 %
Eosinophils Absolute: 0.1 K/uL (ref 0.0–0.5)
Eosinophils Relative: 3 %
HCT: 34.3 % — ABNORMAL LOW (ref 36.0–46.0)
Hemoglobin: 10.7 g/dL — ABNORMAL LOW (ref 12.0–15.0)
Immature Granulocytes: 0 %
Lymphocytes Relative: 20 %
Lymphs Abs: 1 K/uL (ref 0.7–4.0)
MCH: 30.2 pg (ref 26.0–34.0)
MCHC: 31.2 g/dL (ref 30.0–36.0)
MCV: 96.9 fL (ref 80.0–100.0)
Monocytes Absolute: 0.5 K/uL (ref 0.1–1.0)
Monocytes Relative: 9 %
Neutro Abs: 3.4 K/uL (ref 1.7–7.7)
Neutrophils Relative %: 67 %
Platelet Count: 236 K/uL (ref 150–400)
RBC: 3.54 MIL/uL — ABNORMAL LOW (ref 3.87–5.11)
RDW: 14.3 % (ref 11.5–15.5)
WBC Count: 5 K/uL (ref 4.0–10.5)
nRBC: 0 % (ref 0.0–0.2)

## 2018-04-25 LAB — CMP (CANCER CENTER ONLY)
ALBUMIN: 3.5 g/dL (ref 3.5–5.0)
ALT: 8 U/L (ref 0–44)
AST: 13 U/L — AB (ref 15–41)
Alkaline Phosphatase: 51 U/L (ref 38–126)
Anion gap: 8 (ref 5–15)
BUN: 13 mg/dL (ref 8–23)
CO2: 28 mmol/L (ref 22–32)
CREATININE: 0.78 mg/dL (ref 0.44–1.00)
Calcium: 9.6 mg/dL (ref 8.9–10.3)
Chloride: 105 mmol/L (ref 98–111)
GFR, Est AFR Am: >60 mL/min (ref >60)
GFR, Estimated: >60 mL/min (ref >60)
GLUCOSE: 117 mg/dL — AB (ref 70–99)
POTASSIUM: 4 mmol/L (ref 3.5–5.1)
SODIUM: 141 mmol/L (ref 135–145)
TOTAL PROTEIN: 6.7 g/dL (ref 6.5–8.1)
Total Bilirubin: 0.3 mg/dL (ref 0.3–1.2)

## 2018-04-25 NOTE — Progress Notes (Signed)
[facility] [place], [place] [address] [phone]   Interval Evaluation  Date of Service: 04/25/18 Patient Name: Marie Haynes Patient MRN: [id number] Patient DOB: Oct 02, 1931 Provider: Ventura Sellers, MD  Identifying Statement:  Marie Haynes is a 84 y.o. 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 Christella Noa    04/08/2018 -  Chemotherapy    The patient had [No matching medication found in this treatment plan]  for chemotherapy treatment.      Biomarkers:  MGMT Unknown.  IDH 1/2 Wild type.  EGFR Unknown  TERT Unknown   Interval History:  Marie Haynes presents for follow up today, now in second week of radiation therapy.  She describes no new or progressive neurologic deficits.  She denies seizures or recurrent headaches.  Continues to have blurry vision in right eye since surgery.  No issues with study drug.  H+P (04/05/18) Patient presented to medical attention several weeks ago with new onset severe headache, which was a new complaint for her.  CT demonstrated a brain mass, and follow up MRI confirmed likely neoplasm.  After review by neurosurgery, she underwent craniotomy and resection of the mass with Dr. Christella Noa on 03/20/18.  She tolerated surgery well without any complaints or post-op deficits.  Since surgery she has been at home recovering, no recurrence of headaches.  One day last week she woke up "very confused" and unable to use left hand/arm in particular.  This gradually improved during the day until she was back at baseline by the evening; this "spell" has not recurred since that time.  She is currently off Keppra and did feel "drowsy" while on 574m twice per day.  She presents today to review pathology, prognosis, treatment options moving forward.  Medications: Current  Outpatient Medications on File Prior to Visit  Medication Sig Dispense Refill  . aspirin EC 81 MG tablet Take 81 mg by mouth daily.    . Calcium Carbonate-Vitamin D (CALCIUM + D PO) Take 3 tablets by mouth 2 (two) times daily.     . cholecalciferol (VITAMIN D) 400 UNITS TABS Take 4,000 Units by mouth daily.     .Marland Kitchendocusate sodium (COLACE) 100 MG capsule Take 100 mg by mouth 2 (two) times daily as needed for mild constipation.    . Glucosamine-Chondroit-Vit C-Mn (GLUCOSAMINE 1500 COMPLEX PO) Take 2 tablets by mouth 2 (two) times daily.      .Marland KitchenHYDROmorphone (DILAUDID) 2 MG tablet Take 1 tablet (2 mg total) by mouth every 4 (four) hours as needed for severe pain. 30 tablet 0  . Investigational Ramipril 1.25 MG capsule [id number] Take 1 capsule (1.25 mg total) by mouth at bedtime for 7 days. Take at bedtime with or without food. Drink adequate water to avoid dehydration. Avoid salt substitutes that are high in potassium. 133 capsule 0  . levETIRAcetam (KEPPRA) 250 MG tablet Take 1 tablet (250 mg total) by mouth 2 (two) times daily. 60 tablet 3  . levothyroxine (SYNTHROID, LEVOTHROID) 75 MCG tablet Take 1 tablet (75 mcg total) by mouth daily before breakfast. 30 tablet 3  . magnesium 30 MG tablet Take 90 mg by mouth every evening.    . ondansetron (ZOFRAN) 8 MG tablet Take 1 tablet (8 mg total) by mouth 2 (two) times daily as needed. Start on the third  day after chemotherapy. 30 tablet 1  . Red Yeast Rice Extract (RED YEAST RICE PO) Take 1 tablet by mouth daily.    . temozolomide (TEMODAR) 100 MG capsule Take 1 capsule (100 mg total) by mouth daily. May take on an empty stomach to decrease nausea & vomiting. 42 capsule 0  . vitamin A 7500 UNIT capsule Take 7,500 Units by mouth daily.    . vitamin C (ASCORBIC ACID) 500 MG tablet Take 500 mg by mouth 2 (two) times daily.     . vitamin E 400 UNIT capsule Take 400 Units by mouth every other day.     . vitamin k 100 MCG tablet Take 100 mcg by mouth daily.        No current facility-administered medications on file prior to visit.     Allergies:  Allergies  Allergen Reactions  . Oxycodone-Acetaminophen Anaphylaxis and Nausea And Vomiting  . Codeine Other (See Comments)    constipation   Past Medical History:  Past Medical History:  Diagnosis Date  . Anxiety and depression   . Constipation   . DJD (degenerative joint disease)   . Family history of adverse reaction to anesthesia    Mother - "shock" - due to anesthesia-   . Hypothyroidism   . Increased urinary frequency    Dr. MacDiarmid  . LEG CRAMPS, NOCTURNAL 02/22/2007   Qualifier: Diagnosis of  By: Paz MD, Jose E.   . Osteoporosis   . Spinal stenosis   . Stress incontinence    (saw Urology 5-09)    . Symptomatic cholelithiasis 2017   reluctant to have surgery  . Urge and stress incontinence    Dr. MacDiarmid   Past Surgical History:  Past Surgical History:  Procedure Laterality Date  . APPLICATION OF CRANIAL NAVIGATION N/A 03/20/2018   Procedure: APPLICATION OF CRANIAL NAVIGATION;  Surgeon: Cabbell, Kyle, MD;  Location: [facility] OR;  Service: Neurosurgery;  Laterality: N/A;  . BACK SURGERY  [id number]  . COLONOSCOPY    . CRANIOTOMY Right 03/20/2018   Procedure: Right Temporal craniotomy for tumor resection with brainlab;  Surgeon: Cabbell, Kyle, MD;  Location: [facility] OR;  Service: Neurosurgery;  Laterality: Right;  Right Temporal craniotomy for tumor resection with brainlab  . DILATION AND CURETTAGE OF UTERUS    . EYE SURGERY Bilateral    cataract   Social History:  Social History   Socioeconomic History  . Marital status: Married    Spouse name: Not on file  . Number of children: 3  . Years of education: Not on file  . Highest education level: Not on file  Occupational History  . Occupation: retired english teacher     Employer: RETIRED  Social Needs  . Financial resource strain: Not on file  . Food insecurity:    Worry: Not on file    Inability: Not on file  .  Transportation needs:    Medical: No    Non-medical: No  Tobacco Use  . Smoking status: Never Smoker  . Smokeless tobacco: Never Used  Substance and Sexual Activity  . Alcohol use: Yes    Comment: Rare  . Drug use: No  . Sexual activity: Not on file  Lifestyle  . Physical activity:    Days per week: Not on file    Minutes per session: Not on file  . Stress: Not on file  Relationships  . Social connections:    Talks on phone: Not on file    Gets together: Not on   file    Attends religious service: Not on file    Active member of club or organization: Not on file    Attends meetings of clubs or organizations: Not on file    Relationship status: Not on file  . Intimate partner violence:    Fear of current or ex partner: No    Emotionally abused: No    Physically abused: No    Forced sexual activity: No  Other Topics Concern  . Not on file  Social History Narrative   Household- pt and husband   Daughter bipolar   Family History:  Family History  Problem Relation Age of Onset  . Heart failure Mother   . Heart attack Mother 97       dx in her 51s  . Colon cancer Neg Hx   . Breast cancer Neg Hx   . Diabetes Neg Hx     Review of Systems: Constitutional: Denies fevers, chills or abnormal weight loss Eyes: Denies blurriness of vision Ears, nose, mouth, throat, and face: Denies mucositis or sore throat Respiratory: Denies cough, dyspnea or wheezes Cardiovascular: Denies palpitation, chest discomfort or lower extremity swelling Gastrointestinal:  Denies nausea, constipation, diarrhea GU: Denies dysuria or incontinence Skin: Denies abnormal skin rashes Neurological: Per HPI Musculoskeletal: Denies joint pain, back or neck discomfort. No decrease in ROM Behavioral/Psych: Denies anxiety, disturbance in thought content, and mood instability  Physical Exam: Vitals:   04/25/18 1415  BP: (!) 143/62  Pulse: 73  Resp: 18  Temp: 98.2 F (36.8 C)  SpO2: 100%   KPS:  80. General: Alert, cooperative, pleasant, in no acute distress Head: Craniotomy scar noted, dry and intact. EENT: No conjunctival injection or scleral icterus. Oral mucosa moist Lungs: Resp effort normal Cardiac: Regular rate and rhythm Abdomen: Soft, non-distended abdomen Skin: No rashes cyanosis or petechiae. Extremities: No clubbing or edema  Neurologic Exam: Mental Status: Awake, alert, attentive to examiner. Oriented to self and environment. Language is fluent with intact comprehension.  Cranial Nerves: Right eye modest impairment of acuity. Visual fields are full. Extra-ocular movements intact. R eye ptosis. Face is symmetric, tongue midline. Motor: Tone and bulk are normal. Power is full in both arms and legs. Reflexes are symmetric, no pathologic reflexes present. Intact finger to nose bilaterally Sensory: Intact to light touch and temperature Gait: Independent  Labs: I have reviewed the data as listed    Component Value Date/Time   NA 141 04/25/2018 1340   K 4.0 04/25/2018 1340   CL 105 04/25/2018 1340   CO2 28 04/25/2018 1340   GLUCOSE 117 (H) 04/25/2018 1340   BUN 13 04/25/2018 1340   CREATININE 0.78 04/25/2018 1340   CALCIUM 9.6 04/25/2018 1340   PROT 6.7 04/25/2018 1340   ALBUMIN 3.5 04/25/2018 1340   AST 13 (L) 04/25/2018 1340   ALT 8 04/25/2018 1340   ALKPHOS 51 04/25/2018 1340   BILITOT 0.3 04/25/2018 1340   GFRNONAA >60 04/25/2018 1340   GFRAA >60 04/25/2018 1340   Lab Results  Component Value Date   WBC 5.0 04/25/2018   NEUTROABS 3.4 04/25/2018   HGB 10.7 (L) 04/25/2018   HCT 34.3 (L) 04/25/2018   MCV 96.9 04/25/2018   PLT 236 04/25/2018     Assessment/Plan 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  She may continue Temodar as prior until next lab check.  Will also continue with Ramipril per [id number] study.  Chemotherapy should be held for the following:   ANC less than 1,000  Platelets less than 100,000  LFT or creatinine greater than 2x ULN  If clinical concerns/contraindications develop  We will continue to follow in 2 weeks during week 4 of radiation with labs for evaluation.    Recommended routine vision exam given impaired right eye acuity.  We appreciate the opportunity to participate in the care of Marie Haynes.   All questions were answered. The patient knows to call the clinic with any problems, questions or concerns. No barriers to learning were detected.  The total time spent in the encounter was 25 minutes and more than 50% was on counseling and review of test results   Ventura Sellers, MD Medical Director of Neuro-Oncology [facility] 04/25/18 1:49 PM

## 2018-04-26 ENCOUNTER — Telehealth: Payer: Self-pay | Admitting: Medical Oncology

## 2018-04-26 ENCOUNTER — Inpatient Hospital Stay: Payer: Medicare Other

## 2018-04-26 ENCOUNTER — Encounter: Payer: Self-pay | Admitting: Rehabilitation

## 2018-04-26 ENCOUNTER — Ambulatory Visit: Payer: Medicare Other | Admitting: Rehabilitation

## 2018-04-26 ENCOUNTER — Ambulatory Visit
Admission: RE | Admit: 2018-04-26 | Discharge: 2018-04-26 | Disposition: A | Discharge status: Home / Self Care | Payer: Medicare Other | Source: Ambulatory Visit | Attending: Radiation Oncology | Admitting: Radiation Oncology

## 2018-04-26 DIAGNOSIS — R262 Difficulty in walking, not elsewhere classified: Secondary | ICD-10-CM

## 2018-04-26 DIAGNOSIS — Z483 Aftercare following surgery for neoplasm: Secondary | ICD-10-CM

## 2018-04-26 DIAGNOSIS — M6281 Muscle weakness (generalized): Secondary | ICD-10-CM

## 2018-04-26 DIAGNOSIS — R293 Abnormal posture: Secondary | ICD-10-CM

## 2018-04-26 DIAGNOSIS — Z510 Encounter for antineoplastic radiation therapy: Secondary | ICD-10-CM | POA: Diagnosis not present

## 2018-04-26 DIAGNOSIS — C712 Malignant neoplasm of temporal lobe: Secondary | ICD-10-CM | POA: Diagnosis not present

## 2018-04-26 NOTE — Telephone Encounter (Signed)
[id number] Patient missed today's lab appt for next titration of Ramipril for next week Monday. LVMOM x2 with spouse informing him of missed appointment and letting him know that I will reschedule a new lab appointment for patient Monday, prior to her RT appt. Messages are left with spouse because patient's VM is full and they do not have a home phone.  Maxwell Marion, RN, BSN, [facility] Clinical Research 04/26/2018 4:31 PM

## 2018-04-26 NOTE — Therapy (Signed)
[place], [place], [address] Phone: [phone]   Fax:  [phone]  Physical Therapy Treatment  Patient Details  Name: Marie Haynes MRN: [id number] Date of Birth: 1932/03/08 Referring Provider (PT): Dr. Isidore Moos   Encounter Date: 04/26/2018  PT End of Session - 04/26/18 2151    Visit Number  5    Number of Visits  9    Date for PT Re-Evaluation  05/11/18    PT Start Time  1102    PT Stop Time  1145    PT Time Calculation (min)  43 min    Activity Tolerance  Patient tolerated treatment well    Behavior During Therapy  [facility] for tasks assessed/performed       Past Medical History:  Diagnosis Date  . Anxiety and depression   . Constipation   . DJD (degenerative joint disease)   . Family history of adverse reaction to anesthesia    Mother - "shock" - due to anesthesia-   . Hypothyroidism   . Increased urinary frequency    Dr. Matilde Sprang  . LEG CRAMPS, NOCTURNAL 02/22/2007   Qualifier: Diagnosis of  By: Larose Kells MD, [place] Osteoporosis   . Spinal stenosis   . Stress incontinence    (saw Urology 5-09)    . Symptomatic cholelithiasis 2017   reluctant to have surgery  . Urge and stress incontinence    Dr. Matilde Sprang    Past Surgical History:  Procedure Laterality Date  . APPLICATION OF CRANIAL NAVIGATION N/A 03/20/2018   Procedure: APPLICATION OF CRANIAL NAVIGATION;  Surgeon: Ashok Pall, MD;  Location: [place];  Service: Neurosurgery;  Laterality: N/A;  . BACK SURGERY  [phone]  . COLONOSCOPY    . CRANIOTOMY Right 03/20/2018   Procedure: Right Temporal craniotomy for tumor resection with brainlab;  Surgeon: Ashok Pall, MD;  Location: [place];  Service: Neurosurgery;  Laterality: Right;  Right Temporal craniotomy for tumor resection with brainlab  . DILATION AND CURETTAGE OF UTERUS    . EYE SURGERY Bilateral    cataract    There were no vitals filed for this visit.  Subjective  Assessment - 04/26/18 1103    Subjective  Just very tired.  Like exhausted.  Like my head is too heavy to hold it up.  Appt wtih Dr. Mickeal Skinner went well.  nothing new to report.      Pertinent History  pt with right temporal glioblasatoma with craniotomy  03/20/2018.  she plans to start radiation and chemo next week Past history includes osteoporosis and spinal stenosis     Patient Stated Goals  to make sure she is walking ok     Currently in Pain?  No/denies                       [facility] Adult PT Treatment/Exercise - 04/26/18 0001      Ambulation/Gait   Gait Comments  gait around building x 8 minutes at casual pace with stop and start, turns, head turns R/L, and up/down at the last part of the walk.  No SOB or LOB except with CGA during head turns with feet following gaze      High Level Balance   High Level Balance Comments  in parallel bars; backward  walking, side steps, step ups onto blue foam x 10 bil, side step ups onto blue foam x 8bil, standing on blue foam pink ball overhead lift x 10  with CGA for posterior LOB, pink ball lateral movements on blue foam, EC on blue foam minA      Neuro Re-ed    Neuro Re-ed Details   seated on dynadisc marching x 10 no back support                   PT Long Term Goals - 04/26/18 2153      PT LONG TERM GOAL #1   Title  pt wants to be able to stand for 5 minutes unsupported so that she can help  prepare a small meal     Status  On-going      PT LONG TERM GOAL #2   Title  Pt will be able to do 10 sit to stands in 30 seconds as an indication of increased general strength     Status  On-going      PT LONG TERM GOAL #3   Title  pt will decrease TUG score to < 14 indicating an improvment in functional mobility     Status  On-going      PT LONG TERM GOAL #4   Title  Pt verbalize a strategy for managing treatment related fatigue     Status  On-going      PT LONG TERM GOAL #5   Title  Pt will be independent in a home  exercise program for general strength and endurance     Status  On-going      PT LONG TERM GOAL #6   Title  Pt will improve berg balance score to 55 or greater    Status  On-going            Plan - 04/26/18 2151    Clinical Impression Statement  Pt starting to feel extreme fatigue from chemo/radiation but is still wanting to perform all PT activities and exercise.  Focus on gait, endurance, and stability for fall prevention.      PT Frequency  2x / week    PT Duration  4 weeks    PT Treatment/Interventions  ADLs/Self Care Home Management;DME Instruction;Balance training;Therapeutic exercise;Therapeutic activities;Functional mobility training;Stair training;Gait training;Patient/family education    PT Next Visit Plan  continue endurance, general strength, and balance work        Patient will benefit from skilled therapeutic intervention in order to improve the following deficits and impairments:  Abnormal gait, Postural dysfunction, Decreased endurance, Decreased activity tolerance, Decreased strength, Difficulty walking, Decreased knowledge of precautions, Decreased balance  Visit Diagnosis: Aftercare following surgery for neoplasm  Muscle weakness (generalized)  Abnormal posture  Difficulty in walking     Problem List Patient Active Problem List   Diagnosis Date Noted  . Cancer of temporal lobe ([place]) 04/08/2018  . 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 Follow-up -------------PCP NOTES 02/10/2015  . Annual physical exam 09/04/2012  . Hyperlipidemia, mild 09/03/2012  . Anxiety and depression 06/23/2011  . SKIN LESION 03/25/2010  . Osteoarthritis 12/12/2007  . Hypothyroidism 02/22/2007  . SPINAL STENOSIS 02/22/2007  . LEG CRAMPS, NOCTURNAL 02/22/2007  . Osteoporosis 02/22/2007    Shan Levans, PT 04/26/2018, 9:56 PM  [place], [place],  [address] Phone: [phone]   Fax:  [phone]  Name: Marie Haynes MRN: [id number] Date of Birth: 14-Jan-1932

## 2018-04-28 ENCOUNTER — Ambulatory Visit
Admission: RE | Admit: 2018-04-28 | Discharge: 2018-04-28 | Disposition: A | Discharge status: Home / Self Care | Payer: Medicare Other | Source: Ambulatory Visit | Attending: Radiation Oncology | Admitting: Radiation Oncology

## 2018-04-28 DIAGNOSIS — C712 Malignant neoplasm of temporal lobe: Secondary | ICD-10-CM | POA: Diagnosis not present

## 2018-04-28 DIAGNOSIS — Z510 Encounter for antineoplastic radiation therapy: Secondary | ICD-10-CM | POA: Diagnosis not present

## 2018-04-29 ENCOUNTER — Ambulatory Visit
Admission: RE | Admit: 2018-04-29 | Discharge: 2018-04-29 | Disposition: A | Discharge status: Home / Self Care | Payer: Medicare Other | Source: Ambulatory Visit | Attending: Radiation Oncology | Admitting: Radiation Oncology

## 2018-04-29 ENCOUNTER — Inpatient Hospital Stay: Payer: Medicare Other

## 2018-04-29 ENCOUNTER — Encounter: Payer: Self-pay | Admitting: Medical Oncology

## 2018-04-29 ENCOUNTER — Ambulatory Visit: Payer: Medicare Other

## 2018-04-29 ENCOUNTER — Telehealth: Payer: Self-pay | Admitting: Internal Medicine

## 2018-04-29 DIAGNOSIS — Z483 Aftercare following surgery for neoplasm: Secondary | ICD-10-CM | POA: Diagnosis not present

## 2018-04-29 DIAGNOSIS — C719 Malignant neoplasm of brain, unspecified: Secondary | ICD-10-CM

## 2018-04-29 DIAGNOSIS — Z510 Encounter for antineoplastic radiation therapy: Secondary | ICD-10-CM | POA: Diagnosis not present

## 2018-04-29 DIAGNOSIS — R293 Abnormal posture: Secondary | ICD-10-CM

## 2018-04-29 DIAGNOSIS — M6281 Muscle weakness (generalized): Secondary | ICD-10-CM

## 2018-04-29 DIAGNOSIS — C712 Malignant neoplasm of temporal lobe: Secondary | ICD-10-CM | POA: Diagnosis not present

## 2018-04-29 DIAGNOSIS — R262 Difficulty in walking, not elsewhere classified: Secondary | ICD-10-CM

## 2018-04-29 NOTE — Telephone Encounter (Signed)
No 11/21 los

## 2018-04-29 NOTE — Progress Notes (Signed)
WF 1801 I met with patient, in clinic with spouse and grand-daughters, for her RT appointment today. I reminded patient of needing to draw labs for study, after her RT today, so that we can assess and titrate to 5 mg. Patient and spouse gave verbal understanding. Lab draw completed for week 3, blood sample to be sent to [facility] today . Patient will return to clinic for next two days for RT. I asked spouse to bring study dispensed medication bottle Ramipril for pill count and medication diary on Wednesday, spouse gave verbal understanding.  Patient and spouse thanked for their time and were encouraged to call with questions.  Maxwell Marion, RN, BSN, [facility] Clinical Research 04/29/2018 4:13 PM

## 2018-04-29 NOTE — Therapy (Signed)
[place], [place], [address] Phone: [phone]   Fax:  [phone]  Physical Therapy Treatment  Patient Details  Name: Marie Haynes MRN: [id number] Date of Birth: Sep 18, 1931 Referring Provider (PT): Dr. Isidore Moos   Encounter Date: 04/29/2018  PT End of Session - 04/29/18 1159    Visit Number  6    Number of Visits  9    Date for PT Re-Evaluation  05/11/18    PT Start Time  1105    PT Stop Time  1151    PT Time Calculation (min)  46 min    Activity Tolerance  Patient tolerated treatment well    Behavior During Therapy  [facility] for tasks assessed/performed       Past Medical History:  Diagnosis Date  . Anxiety and depression   . Constipation   . DJD (degenerative joint disease)   . Family history of adverse reaction to anesthesia    Mother - "shock" - due to anesthesia-   . Hypothyroidism   . Increased urinary frequency    Dr. Matilde Sprang  . LEG CRAMPS, NOCTURNAL 02/22/2007   Qualifier: Diagnosis of  By: Larose Kells MD, [place] Osteoporosis   . Spinal stenosis   . Stress incontinence    (saw Urology 5-09)    . Symptomatic cholelithiasis 2017   reluctant to have surgery  . Urge and stress incontinence    Dr. Matilde Sprang    Past Surgical History:  Procedure Laterality Date  . APPLICATION OF CRANIAL NAVIGATION N/A 03/20/2018   Procedure: APPLICATION OF CRANIAL NAVIGATION;  Surgeon: Ashok Pall, MD;  Location: [place];  Service: Neurosurgery;  Laterality: N/A;  . BACK SURGERY  [phone]  . COLONOSCOPY    . CRANIOTOMY Right 03/20/2018   Procedure: Right Temporal craniotomy for tumor resection with brainlab;  Surgeon: Ashok Pall, MD;  Location: [place];  Service: Neurosurgery;  Laterality: Right;  Right Temporal craniotomy for tumor resection with brainlab  . DILATION AND CURETTAGE OF UTERUS    . EYE SURGERY Bilateral    cataract    There were no vitals filed for this visit.  Subjective  Assessment - 04/29/18 1119    Subjective  Still really tired from radiation. Also my scapl feels so tight, not quite at my incision but over the top of my head.     Pertinent History  pt with right temporal glioblasatoma with craniotomy  03/20/2018.  she plans to start radiation and chemo next week Past history includes osteoporosis and spinal stenosis     Patient Stated Goals  to make sure she is walking ok     Currently in Pain?  No/denies                       [facility] Adult PT Treatment/Exercise - 04/29/18 0001      Neuro Re-ed    Neuro Re-ed Details   In // bars: Heel-toe walking, slow, high knee marching, then toe walking 2x each way with fingertip supoport throughout for each and VCs to decrease forward lean/looking down; then seated on dynadisc for alternate marching x15, then with side-side and fron -back rocking;       Ankle Exercises: Aerobic   Nustep  Level 4, x 8 mins with PTA monitoring pt for fatigue      Ankle Exercises: Standing   Heel Raises  Right;Left   briefly practiced this with each leg to instruct for home  Other Standing Ankle Exercises  Airex on 2" step at back of bike for +1 HHA and SBA: Rt, then Lt step ups, x5 each                  PT Long Term Goals - 04/26/18 2153      PT LONG TERM GOAL #1   Title  pt wants to be able to stand for 5 minutes unsupported so that she can help  prepare a small meal     Status  On-going      PT LONG TERM GOAL #2   Title  Pt will be able to do 10 sit to stands in 30 seconds as an indication of increased general strength     Status  On-going      PT LONG TERM GOAL #3   Title  pt will decrease TUG score to < 14 indicating an improvment in functional mobility     Status  On-going      PT LONG TERM GOAL #4   Title  Pt verbalize a strategy for managing treatment related fatigue     Status  On-going      PT LONG TERM GOAL #5   Title  Pt will be independent in a home exercise program for general  strength and endurance     Status  On-going      PT LONG TERM GOAL #6   Title  Pt will improve berg balance score to 55 or greater    Status  On-going            Plan - 04/29/18 1200    Clinical Impression Statement  Pt reports feeling more fatigued than last week but reports increased energy as we continued PT activities today. She was challenged by // bar activities, was able to tolerate 8 mins on Nustep. Ms. Mcmeekin did requirea few seated rest breaks due to fatigue but then spent this time answering her questions regarding okay to do gentle scar tissue mobs over incision once she has reached 6 weeks post op and assuming her scalp isn't red/sensitive from radiation. Also instructed her ok to do this gently over top of her head and closer towards left ear where she reports feeling tightness. She verbalized understanding this.     Rehab Potential  Good    PT Frequency  2x / week    PT Duration  4 weeks    PT Treatment/Interventions  ADLs/Self Care Home Management;DME Instruction;Balance training;Therapeutic exercise;Therapeutic activities;Functional mobility training;Stair training;Gait training;Patient/family education    PT Next Visit Plan  continue endurance, general strength, and balance work     Oncologist with Plan of Care  Patient       Patient will benefit from skilled therapeutic intervention in order to improve the following deficits and impairments:  Abnormal gait, Postural dysfunction, Decreased endurance, Decreased activity tolerance, Decreased strength, Difficulty walking, Decreased knowledge of precautions, Decreased balance  Visit Diagnosis: Aftercare following surgery for neoplasm  Muscle weakness (generalized)  Abnormal posture  Difficulty in walking     Problem List Patient Active Problem List   Diagnosis Date Noted  . Cancer of temporal lobe ([place]) 04/08/2018  . Glioblastoma with isocitrate dehydrogenase gene wildtype ([place]) 03/20/2018  .  Follow-up -------------PCP NOTES 02/10/2015  . Annual physical exam 09/04/2012  . Hyperlipidemia, mild 09/03/2012  . Anxiety and depression 06/23/2011  . SKIN LESION 03/25/2010  . Osteoarthritis 12/12/2007  . Hypothyroidism 02/22/2007  . SPINAL STENOSIS 02/22/2007  .  LEG CRAMPS, NOCTURNAL 02/22/2007  . Osteoporosis 02/22/2007    Otelia Limes, PTA 04/29/2018, 12:05 PM  [place], [place], [address] Phone: [phone]   Fax:  [phone]  Name: Marie Haynes MRN: [id number] Date of Birth: 1931/08/16

## 2018-04-30 ENCOUNTER — Encounter: Payer: Self-pay | Admitting: Medical Oncology

## 2018-04-30 ENCOUNTER — Encounter: Payer: Self-pay | Admitting: Internal Medicine

## 2018-04-30 ENCOUNTER — Ambulatory Visit
Admission: RE | Admit: 2018-04-30 | Discharge: 2018-04-30 | Disposition: A | Discharge status: Home / Self Care | Payer: Medicare Other | Source: Ambulatory Visit | Attending: Radiation Oncology | Admitting: Radiation Oncology

## 2018-04-30 DIAGNOSIS — Z510 Encounter for antineoplastic radiation therapy: Secondary | ICD-10-CM | POA: Diagnosis not present

## 2018-04-30 DIAGNOSIS — C719 Malignant neoplasm of brain, unspecified: Secondary | ICD-10-CM

## 2018-04-30 DIAGNOSIS — C712 Malignant neoplasm of temporal lobe: Secondary | ICD-10-CM | POA: Diagnosis not present

## 2018-04-30 LAB — RESEARCH LABS

## 2018-04-30 NOTE — Progress Notes (Signed)
[id number]: Ramipril: End of Week 2, Start of Week 3 I met with patient and spouse this afternoon, here for her scheduled radiation treatment. We met just prior to her appointment and I collected study medication Ramipril and medication diaries for review and count, while patient was receiving RT. I assessed patient's blood pressure and it was 143/74 today. Patient confirms to be taking her Temodar daily as instructed.  Ramipril: Patient returned study bottle with 110 capsules. Capsule count verified with PharmD Raul Del.  Medication Diaries: Reviewed for documentation of self-administration and patient reports no missed doses and has been taking 2 capsules as was instructed.  Concomitant Medications: Reviewed and patient reports no new medications, including no new OTC conmeds or supplements. Patient states that she continues with taking ginger capsules for nausea, reports this has helped.  Labs: Labs were collected on 11/25 and sent out to [facility] and I have received results this morning, with no concerning results. Dr. Mauri Reading reviewed. Adverse Events:Patient reports intermittent nausea (gr 1) that continues as well as one episode of diarrhea this morning, and feels it may be related to having curry for dinner last night.I encouraged patient to call clinic should she have worsening nausea/vomitting or diarrhea. Patient was also encouraged to continue with adequate hydration.  Patient with WNL assessment and within parameters to titrate to 4 capsules, for a total of 2m in the evening, review with Dr. VMickeal Skinner  Patient was informed to increase dosage of Ramipril to 4 capsules starting tonight for 7 days and to continue documenting self administration. I reviewed with patient and spouse, again, what to do should a dose be missed, what medications to avoid and and the expected adverse events as well as when to call Dr. VMickeal Skinneror myself. I returned patient's medication diary, along with the study medication  bottle containing 110 capsules and a new Administration schedule for study medication (Ramipril) noting the start date of 11/26-12/2. Teach back method was used with patient and spouse.  Patient to have study labs drawn on Monday 12//2. All patient and spouses questions answered to their satisfaction. Both were thanked for their time and their continued contribution to study and were encouraged to call Dr. VMickeal Skinneror myself with any questions or concerns they may have.  MMaxwell Marion RN, BSN, CAd[facility]view[facility] 04/30/2018 3:49 PM   CManal KreutzerRoyal [id number] 04/30/2018  Adverse Event Log  Study/Protocol: WF 1801(Ramipril) Cycle: End of week 1- start of week 2 Baseline- blurred vision (gr 1) and general weakness (gr 1)  Event Grade Onset Date Resolved Date Drug Name Attribution Treatment Comments  Nausea (intermittent)  1 04/15/18 ongoing Ramilpril unrelated OTC ginger capsules

## 2018-05-01 ENCOUNTER — Ambulatory Visit
Admission: RE | Admit: 2018-05-01 | Discharge: 2018-05-01 | Disposition: A | Discharge status: Home / Self Care | Payer: Medicare Other | Source: Ambulatory Visit | Attending: Radiation Oncology | Admitting: Radiation Oncology

## 2018-05-01 ENCOUNTER — Other Ambulatory Visit: Payer: Self-pay | Admitting: Medical Oncology

## 2018-05-01 DIAGNOSIS — C719 Malignant neoplasm of brain, unspecified: Secondary | ICD-10-CM

## 2018-05-01 DIAGNOSIS — Z510 Encounter for antineoplastic radiation therapy: Secondary | ICD-10-CM | POA: Diagnosis not present

## 2018-05-01 DIAGNOSIS — C712 Malignant neoplasm of temporal lobe: Secondary | ICD-10-CM | POA: Diagnosis not present

## 2018-05-01 NOTE — Progress Notes (Signed)
l °

## 2018-05-06 ENCOUNTER — Ambulatory Visit
Admission: RE | Admit: 2018-05-06 | Discharge: 2018-05-06 | Disposition: A | Discharge status: Home / Self Care | Payer: Medicare Other | Source: Ambulatory Visit | Attending: Radiation Oncology | Admitting: Radiation Oncology

## 2018-05-06 ENCOUNTER — Inpatient Hospital Stay: Payer: Medicare Other | Attending: Internal Medicine

## 2018-05-06 DIAGNOSIS — C712 Malignant neoplasm of temporal lobe: Secondary | ICD-10-CM | POA: Insufficient documentation

## 2018-05-06 DIAGNOSIS — Z510 Encounter for antineoplastic radiation therapy: Secondary | ICD-10-CM | POA: Diagnosis not present

## 2018-05-06 DIAGNOSIS — M1990 Unspecified osteoarthritis, unspecified site: Secondary | ICD-10-CM | POA: Insufficient documentation

## 2018-05-06 DIAGNOSIS — Z800 Family history of malignant neoplasm of digestive organs: Secondary | ICD-10-CM | POA: Insufficient documentation

## 2018-05-06 DIAGNOSIS — E039 Hypothyroidism, unspecified: Secondary | ICD-10-CM | POA: Insufficient documentation

## 2018-05-06 DIAGNOSIS — F329 Major depressive disorder, single episode, unspecified: Secondary | ICD-10-CM | POA: Insufficient documentation

## 2018-05-06 DIAGNOSIS — N393 Stress incontinence (female) (male): Secondary | ICD-10-CM | POA: Insufficient documentation

## 2018-05-06 DIAGNOSIS — Z923 Personal history of irradiation: Secondary | ICD-10-CM | POA: Insufficient documentation

## 2018-05-06 DIAGNOSIS — C719 Malignant neoplasm of brain, unspecified: Secondary | ICD-10-CM

## 2018-05-06 DIAGNOSIS — Z7982 Long term (current) use of aspirin: Secondary | ICD-10-CM | POA: Insufficient documentation

## 2018-05-06 DIAGNOSIS — Z79899 Other long term (current) drug therapy: Secondary | ICD-10-CM | POA: Insufficient documentation

## 2018-05-06 DIAGNOSIS — M810 Age-related osteoporosis without current pathological fracture: Secondary | ICD-10-CM | POA: Insufficient documentation

## 2018-05-06 LAB — RESEARCH LABS

## 2018-05-07 ENCOUNTER — Encounter: Payer: Self-pay | Admitting: Physical Therapy

## 2018-05-07 ENCOUNTER — Encounter: Payer: Self-pay | Admitting: Medical Oncology

## 2018-05-07 ENCOUNTER — Encounter: Payer: Self-pay | Admitting: Internal Medicine

## 2018-05-07 ENCOUNTER — Ambulatory Visit
Admission: RE | Admit: 2018-05-07 | Discharge: 2018-05-07 | Disposition: A | Discharge status: Home / Self Care | Payer: Medicare Other | Source: Ambulatory Visit | Attending: Radiation Oncology | Admitting: Radiation Oncology

## 2018-05-07 ENCOUNTER — Ambulatory Visit: Payer: Medicare Other | Attending: Radiation Oncology | Admitting: Physical Therapy

## 2018-05-07 DIAGNOSIS — M6281 Muscle weakness (generalized): Secondary | ICD-10-CM | POA: Diagnosis not present

## 2018-05-07 DIAGNOSIS — Z483 Aftercare following surgery for neoplasm: Secondary | ICD-10-CM | POA: Insufficient documentation

## 2018-05-07 DIAGNOSIS — C719 Malignant neoplasm of brain, unspecified: Secondary | ICD-10-CM

## 2018-05-07 DIAGNOSIS — C712 Malignant neoplasm of temporal lobe: Secondary | ICD-10-CM | POA: Diagnosis not present

## 2018-05-07 DIAGNOSIS — Z510 Encounter for antineoplastic radiation therapy: Secondary | ICD-10-CM | POA: Diagnosis not present

## 2018-05-07 DIAGNOSIS — R293 Abnormal posture: Secondary | ICD-10-CM

## 2018-05-07 DIAGNOSIS — R262 Difficulty in walking, not elsewhere classified: Secondary | ICD-10-CM | POA: Insufficient documentation

## 2018-05-07 NOTE — Progress Notes (Signed)
[id number]: Ramipril: End of Week 3, Start of Week 4 I met with patient this afternoon, here for her scheduled radiation treatment. Patient was dropped off in clinic by spouse for her appointment while he completed an errand. Patient and I met just prior to her appointment and I inquired with patient if she had brought her study medication bottle of Ramipril and Medication diary and patient stated she had forgotten.  Patient's blood pressure was assessed and at 151/70. Patient confirms to be taking her Temodar daily as instructed and confirms to have been taking 4 tabs before bedtime of Ramipril, starting on 11/26, with no missed doses. I had asked patient to bring her study drug and medication tomorrow, when in clinic for her radiation treatment appointment, so that I may verify the drug count and documentation of self-administration, patient gave verbal understanding.  Ramipril: Patient had forgotten to bring today and will bring in tomorrow.  Medication Diaries: Patient had forgotten to bring today and will bring in tomorrow.   Concomitant Medications: Reviewed and patient reports no new medications, including no new OTC conmeds or supplements.  Labs: Labs were collected on 12/2 and sent out to [facility] and I have received results this morning, with no concerning results. Dr. Mauri Reading has reviewed. Adverse Events: Patient reporting today to have been having left hip pain x 1 week and lower back pain that started this morning. Patient states not affecting her walking. Patient asking if it was ok to take tylenol to help ease off some of the discomfort. I reviewed with Dr. Mickeal Skinner patient's pain and concern and per MD, patient is ok to take tylenol. I informed patient that MD said it was alright for her to take tylenol.  Patient continues to be WNL assessment and within parameters to continue with 4 capsules of Ramilpril, for a total of 31m in the evening, reviewed with Dr. VMickeal Skinner Patient was informed to continue  current dosage of Ramipril to 4 capsules and to continue documenting self administration. I reviewed with patient and spouse, again, what to do should a dose be missed, what medications to avoid and and the expected adverse events as well as when to call Dr. VMickeal Skinneror myself.  All patient and spouses questions answered to their satisfaction. At the end of my visit with patient today, I reminded both patient and spouse to bring medication bottle and medication diaries to their clinic visit tomorrow, verbal confirmation received. Both were thanked for their time and their continued contribution to study and were encouraged to call Dr. VMickeal Skinneror myself with any questions or concerns they may have.  MMaxwell Marion RN, BSN, CCesc [facility]Clinical Research 05/07/2018 3:56 PM   CTaegen LennoxRoyal [id number] 05/07/2018  Adverse Event Log  Study/Protocol: WF 1801(Ramipril) Cycle: End of week 3- start of week 4 Baseline- blurred vision (gr 1) and general weakness (gr 1)  Event Grade Onset Date Resolved Date Drug Name Attribution Treatment Comments  Nausea (intermittent)  1 04/15/18 ongoing Ramilpril unrelated OTC ginger capsules   Left hip pain 2 ~05/02/18 ogoing Ramipril unrelated conmed Musculoskeletal  Lower back pain 1 05/07/18 ongoing Ramipril  unrelated  conmed Musculoskeletal

## 2018-05-07 NOTE — Therapy (Signed)
[place], [place], [address] Phone: [phone]   Fax:  [phone]  Physical Therapy Treatment  Patient Details  Name: Marie Haynes MRN: [id number] Date of Birth: 1931/12/30 Referring Provider (PT): Dr. Isidore Moos   Encounter Date: 05/07/2018  PT End of Session - 05/07/18 1120    Visit Number  7    Number of Visits  9    Date for PT Re-Evaluation  05/11/18    PT Start Time  1105    PT Stop Time  1145    PT Time Calculation (min)  40 min    Activity Tolerance  Patient limited by pain    Behavior During Therapy  [facility] for tasks assessed/performed       Past Medical History:  Diagnosis Date  . Anxiety and depression   . Constipation   . DJD (degenerative joint disease)   . Family history of adverse reaction to anesthesia    Mother - "shock" - due to anesthesia-   . Hypothyroidism   . Increased urinary frequency    Dr. Matilde Sprang  . LEG CRAMPS, NOCTURNAL 02/22/2007   Qualifier: Diagnosis of  By: Larose Kells MD, Isabel Osteoporosis   . Spinal stenosis   . Stress incontinence    (saw Urology 5-09)    . Symptomatic cholelithiasis 2017   reluctant to have surgery  . Urge and stress incontinence    Dr. Matilde Sprang    Past Surgical History:  Procedure Laterality Date  . APPLICATION OF CRANIAL NAVIGATION N/A 03/20/2018   Procedure: APPLICATION OF CRANIAL NAVIGATION;  Surgeon: Ashok Pall, MD;  Location: [facility];  Service: Neurosurgery;  Laterality: N/A;  . BACK SURGERY  [id number]  . COLONOSCOPY    . CRANIOTOMY Right 03/20/2018   Procedure: Right Temporal craniotomy for tumor resection with brainlab;  Surgeon: Ashok Pall, MD;  Location: [place];  Service: Neurosurgery;  Laterality: Right;  Right Temporal craniotomy for tumor resection with brainlab  . DILATION AND CURETTAGE OF UTERUS    . EYE SURGERY Bilateral    cataract    There were no vitals filed for this visit.  Subjective Assessment -  05/07/18 1113    Subjective  Pt states that she is in pain today in her low back and left hip.  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  she plans to start radiation and chemo next week Past history includes osteoporosis and spinal stenosis     Patient Stated Goals  to make sure she is walking ok     Currently in Pain?  Yes    Pain Score  --   did not rate, but pt moans, grimances , and needs assitance with mobility    Pain Orientation  Posterior;Left    Pain Descriptors / Indicators  Aching    Pain Type  Acute pain    Pain Radiating Towards  she feels like it is 2 different things in low back and hip     Pain Onset  In the past 7 days    Pain Frequency  Constant    Aggravating Factors   getting up from a seated  position and getting down ,( transitional movements )     Pain Relieving Factors  better when she lies down                        [facility] Adult PT Treatment/Exercise - 05/07/18 0001      Bed Mobility   Bed Mobility  Rolling Left;Supine to Sit;Sit to Supine    Rolling Right  Moderate Assistance - Patient 50-74%    Supine to Sit  Moderate Assistance - Patient 50-74%    Sit to Supine  Moderate Assistance - Patient 50-74%      Ambulation/Gait   Ambulation/Gait  Yes    Ambulation/Gait Assistance  6: Modified independent (Device/Increase time)    Ambulation Distance (Feet)  200 Feet    Assistive device  Rolling walker    Stairs  Yes    Stairs Assistance  4: Min assist    Stairs Assistance Details (indicate cue type and reason)  instructed in going up the steps sideways, but pt would have to ascend with left foot first as handrail is on the right going up and she was not able to do that  with her painful leg. Practiced going up backwards with rolling walker and pt was able to do this much better and was able to step up with either leg.     Gait Comments  suggested that pt use a rolling walker as she appeared much more steady and was able to walk at a quicker pace.  She said she still had back pain but it was much less.       Self-Care   Self-Care  Other Self-Care Comments    Other Self-Care Comments   reviewed bed mobilty assist techniques with pt and husband including possibly using a draw sheet to assist with rolling.        Modalities   Modalities  Moist Heat      Moist Heat Therapy   Number Minutes Moist Heat  15 Minutes    Moist Heat Location  Lumbar Spine   and left hip             PT Education - 05/07/18 1219    Education Details  bed mobility and up and down steps sideways with rail and backwards with rolling walker     Person(s) Educated  Patient;Spouse    Methods  Explanation;Demonstration;Tactile cues;Verbal cues    Comprehension  Verbalized understanding;Returned demonstration          PT Long Term Goals - 05/07/18 1132      PT LONG TERM GOAL #1   Title  pt wants to be able to stand for 5 minutes unsupported so that she can help  prepare a small meal     Baseline  pt states she can do this as she was helping with breakfast this morning.    Status  Achieved      PT LONG TERM GOAL #2   Title  Pt will be able to do 10 sit to stands in 30 seconds as an indication of increased general strength       PT LONG TERM GOAL #3   Title  pt will decrease TUG score to < 14 indicating an improvment in functional mobility     Baseline  17 sec. at eval , not able to test on 05/08/2018 due to pain     Time  4    Period  Weeks    Status  On-going      PT LONG TERM GOAL #4   Title  Pt verbalize a strategy for managing treatment related fatigue     Baseline  Pt and husband feel like they understand how to manage fatigue with doing essential activities when  she feels she has most energy and take scheduled rest breaks     Time  4    Period  Weeks    Status  On-going      PT LONG TERM GOAL #5   Title  Pt will be independent in a home exercise program for general strength and endurance     Status  Achieved      PT LONG TERM GOAL #6   Title  Pt will improve berg balance score to 55 or greater    Time  4    Period  Weeks    Status  On-going            Plan - 05/07/18 1221    Clinical Impression Statement  Pt is very limited by pain and fatigue today.  She was not able to tolerate exercise.  Reviewed goals with pt and husband and instructed in assisted bed mobility and practices different options for stair negotiation as she is having trouble with this at home.  Reviewed home exercise and encouraged pt to stay active at home, doing her essential activities when she has the most energy, scheduling exercise sessions and rest periods.  Will put PT on hold for now and resume when she is able to participate once radiation is over.  Pt and husband acknowledged they will call back once they receive the ok from MD to resume once radiation is complete     Rehab Potential  Good    PT Treatment/Interventions  ADLs/Self Care Home Management;DME Instruction;Balance training;Therapeutic exercise;Therapeutic activities;Functional mobility training;Stair training;Gait training;Patient/family education    PT Next Visit Plan  Reassess     Consulted and Agree with Plan of Care  Patient       Patient will benefit from skilled therapeutic intervention in order to improve the following deficits and impairments:  Abnormal gait, Postural dysfunction, Decreased endurance, Decreased activity tolerance, Decreased strength, Difficulty walking, Decreased knowledge of precautions, Decreased balance  Visit Diagnosis: Aftercare following surgery for neoplasm  Muscle weakness (generalized)  Abnormal posture  Difficulty in walking     Problem List Patient Active  Problem List   Diagnosis Date Noted  . Cancer of temporal lobe ([place]) 04/08/2018  . 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 Follow-up -------------PCP NOTES 02/10/2015  . Annual physical exam 09/04/2012  . Hyperlipidemia, mild 09/03/2012  . Anxiety and depression 06/23/2011  . SKIN LESION 03/25/2010  . Osteoarthritis 12/12/2007  . Hypothyroidism 02/22/2007  . SPINAL STENOSIS 02/22/2007  . LEG CRAMPS, NOCTURNAL 02/22/2007  . Osteoporosis 02/22/2007   Donato Heinz. Owens Shark PT  Norwood Levo 05/07/2018, 12:26 PM  [place], [place], [address] Phone: [phone]   Fax:  [phone]  Name: Marie Haynes MRN: [id number] Date of Birth: May 24, 1932

## 2018-05-08 ENCOUNTER — Ambulatory Visit
Admission: RE | Admit: 2018-05-08 | Discharge: 2018-05-08 | Disposition: A | Discharge status: Home / Self Care | Payer: Medicare Other | Source: Ambulatory Visit | Attending: Radiation Oncology | Admitting: Radiation Oncology

## 2018-05-08 ENCOUNTER — Encounter: Payer: Self-pay | Admitting: Medical Oncology

## 2018-05-08 DIAGNOSIS — Z510 Encounter for antineoplastic radiation therapy: Secondary | ICD-10-CM | POA: Diagnosis not present

## 2018-05-08 DIAGNOSIS — C712 Malignant neoplasm of temporal lobe: Secondary | ICD-10-CM | POA: Diagnosis not present

## 2018-05-08 DIAGNOSIS — C719 Malignant neoplasm of brain, unspecified: Secondary | ICD-10-CM

## 2018-05-08 NOTE — Progress Notes (Signed)
[id number]: Ramipril: End of Week 3, Start of Week 4 I met with patient and spouse again this afternoon, just prior to her radiation treatment. Spouse provided me wthe study dispensed medication bottle of ramipril as well as the medication diary, since they forgot to bring it with them yesterday.  Ramipril: Medication bottle taken to pharmacy for count verification with PharmD Raul Del. Patient returned 78 pills. Last weeks count (12/26 was 110). Study medication bottle returned to patient with 78 capsules remaining.  Medication Diaries: Patient reports and documents no missed doses. Per patient's documentation, on 12/26, 2 capsules were taken, when patient should have taken 4 (10m) that night. Patient reports to have taken 4 capsules (5 mg) from 11/27- 12/3 on medication diary. Patient was returned her December medication diary and informed to continue with documentation. There may be a documentation error on the number of capsules that were taken on the 26th of November, since the return pill count is correct.   Patient and spouse were informed to continue with 4 capsules (5 mg) and that we will reassess with labs and adverse event assessment on Monday 12/9. I reviewed with patient and spouse, again, what to do should a dose be missed, what medications to avoid and the expected adverse events as well as when to call Dr. VMickeal Skinner Patient and spouse thanked for their time and continued support of study and were encouraged to call clinic with any questions or concerns they may have. MMaxwell Marion RN, BSN, CWakemedClinical Research 05/08/2018 4:50 PM

## 2018-05-09 ENCOUNTER — Encounter: Payer: Self-pay | Admitting: Medical Oncology

## 2018-05-09 ENCOUNTER — Inpatient Hospital Stay: Payer: Medicare Other

## 2018-05-09 ENCOUNTER — Ambulatory Visit
Admission: RE | Admit: 2018-05-09 | Discharge: 2018-05-09 | Disposition: A | Discharge status: Home / Self Care | Payer: Medicare Other | Source: Ambulatory Visit | Attending: Radiation Oncology | Admitting: Radiation Oncology

## 2018-05-09 ENCOUNTER — Ambulatory Visit: Payer: Medicare Other | Admitting: Physical Therapy

## 2018-05-09 ENCOUNTER — Inpatient Hospital Stay (HOSPITAL_BASED_OUTPATIENT_CLINIC_OR_DEPARTMENT_OTHER): Payer: Medicare Other | Admitting: Internal Medicine

## 2018-05-09 ENCOUNTER — Other Ambulatory Visit: Payer: Self-pay

## 2018-05-09 VITALS — BP 132/79 | HR 72 | Temp 97.6°F | Resp 14 | Ht 63.0 in | Wt 118.2 lb

## 2018-05-09 DIAGNOSIS — C719 Malignant neoplasm of brain, unspecified: Secondary | ICD-10-CM

## 2018-05-09 DIAGNOSIS — Z79899 Other long term (current) drug therapy: Secondary | ICD-10-CM

## 2018-05-09 DIAGNOSIS — F329 Major depressive disorder, single episode, unspecified: Secondary | ICD-10-CM

## 2018-05-09 DIAGNOSIS — Z923 Personal history of irradiation: Secondary | ICD-10-CM | POA: Diagnosis not present

## 2018-05-09 DIAGNOSIS — M1990 Unspecified osteoarthritis, unspecified site: Secondary | ICD-10-CM

## 2018-05-09 DIAGNOSIS — Z800 Family history of malignant neoplasm of digestive organs: Secondary | ICD-10-CM

## 2018-05-09 DIAGNOSIS — M810 Age-related osteoporosis without current pathological fracture: Secondary | ICD-10-CM | POA: Diagnosis not present

## 2018-05-09 DIAGNOSIS — E039 Hypothyroidism, unspecified: Secondary | ICD-10-CM

## 2018-05-09 DIAGNOSIS — N393 Stress incontinence (female) (male): Secondary | ICD-10-CM

## 2018-05-09 DIAGNOSIS — C712 Malignant neoplasm of temporal lobe: Secondary | ICD-10-CM | POA: Diagnosis not present

## 2018-05-09 DIAGNOSIS — Z7982 Long term (current) use of aspirin: Secondary | ICD-10-CM

## 2018-05-09 DIAGNOSIS — Z510 Encounter for antineoplastic radiation therapy: Secondary | ICD-10-CM | POA: Diagnosis not present

## 2018-05-09 LAB — CMP (CANCER CENTER ONLY)
ALBUMIN: 3.4 g/dL — AB (ref 3.5–5.0)
ALK PHOS: 45 U/L (ref 38–126)
ALT: <6 U/L (ref 0–44)
AST: 11 U/L — AB (ref 15–41)
Anion gap: 10 (ref 5–15)
BILIRUBIN TOTAL: 0.2 mg/dL — AB (ref 0.3–1.2)
BUN: 15 mg/dL (ref 8–23)
CO2: 25 mmol/L (ref 22–32)
Calcium: 9.4 mg/dL (ref 8.9–10.3)
Chloride: 103 mmol/L (ref 98–111)
Creatinine: 0.73 mg/dL (ref 0.44–1.00)
GFR, Est AFR Am: >60 mL/min (ref >60)
GFR, Estimated: >60 mL/min (ref >60)
GLUCOSE: 80 mg/dL (ref 70–99)
Potassium: 4.6 mmol/L (ref 3.5–5.1)
Sodium: 138 mmol/L (ref 135–145)
Total Protein: 6.7 g/dL (ref 6.5–8.1)

## 2018-05-09 LAB — CBC WITH DIFFERENTIAL (CANCER CENTER ONLY)
ABS IMMATURE GRANULOCYTES: 0.01 10*3/uL (ref 0.00–0.07)
Basophils Absolute: 0 10*3/uL (ref 0.0–0.1)
Basophils Relative: 0 %
Eosinophils Absolute: 0.1 10*3/uL (ref 0.0–0.5)
Eosinophils Relative: 2 %
HEMATOCRIT: 32.1 % — AB (ref 36.0–46.0)
HEMOGLOBIN: 10.2 g/dL — AB (ref 12.0–15.0)
Immature Granulocytes: 0 %
LYMPHS PCT: 12 %
Lymphs Abs: 0.7 10*3/uL (ref 0.7–4.0)
MCH: 30.2 pg (ref 26.0–34.0)
MCHC: 31.8 g/dL (ref 30.0–36.0)
MCV: 95 fL (ref 80.0–100.0)
MONO ABS: 0.6 10*3/uL (ref 0.1–1.0)
MONOS PCT: 11 %
NEUTROS ABS: 3.9 10*3/uL (ref 1.7–7.7)
Neutrophils Relative %: 75 %
Platelet Count: 185 10*3/uL (ref 150–400)
RBC: 3.38 MIL/uL — ABNORMAL LOW (ref 3.87–5.11)
RDW: 13.9 % (ref 11.5–15.5)
WBC Count: 5.3 10*3/uL (ref 4.0–10.5)
nRBC: 0 % (ref 0.0–0.2)

## 2018-05-09 MED ORDER — LEVOTHYROXINE SODIUM 75 MCG PO TABS: 75.0000 ug | ORAL_TABLET | Freq: Every day | ORAL | 1 refills | Status: AC

## 2018-05-09 NOTE — Progress Notes (Signed)
DCP-001 Consent Form [place] I met with patient, spouse and their grand daughter today while in clinic and just prior to patient's appointment with Dr. Mauri Reading. I had spoken to patient and spouse about the study and gave them a brief overview of what the study is about and provided patient with study consent and authorization forms for their review on 12/4 to take home. Patient and spouse confirm today that they both have read over the consent and authorization form and patient states she wishes to participate. Patient confirms understanding that the study includes individuals regardless of their decision to participate in an NCI clinical trial. The patient is aware that participation in this study involves a one-time consent, and a collection of demographic variables with the majority of data collected from her medical record. It was noted to patient that no patient identifiers are being reported via the screening tool. The consent and authorization forms were reviewed in their entirety. After all of patient's and spouses questions were answered to their satisfaction patient agreed to participate. I was in the process of obtaining consent when patient's appointment with Dr. Mauri Reading started. Research nurse Foye Spurling completed consenting of patient, due to me having another appointment.  Patient meets eligibility and will be enrolled to study. Patient provided with copies of her signed documents.  Maxwell Marion, RN, BSN, [facility] Clinical Research 05/09/2018 2:27 PM

## 2018-05-09 NOTE — Progress Notes (Signed)
[facility] [place], [place] [address] [phone]   Interval Evaluation  Date of Service: 05/09/18 Patient Name: Marie Haynes Patient MRN: [id number] Patient DOB: 1932/05/29 Provider: Ventura Sellers, MD  Identifying Statement:  Marie Haynes is a 84 y.o. 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 Christella Noa    04/08/2018 -  Chemotherapy    The patient had [No matching medication found in this treatment plan]  for chemotherapy treatment.      Biomarkers:  MGMT Unknown.  IDH 1/2 Wild type.  EGFR Unknown  TERT Unknown   Interval History:  [place] presents for follow up today, now in fourth week of radiation therapy.  She describes no new or progressive neurologic deficits.  She denies seizures or recurrent headaches.  No issues with study drug.  H+P (04/05/18) Patient presented to medical attention several weeks ago with new onset severe headache, which was a new complaint for her.  CT demonstrated a brain mass, and follow up MRI confirmed likely neoplasm.  After review by neurosurgery, she underwent craniotomy and resection of the mass with Dr. Christella Noa on 03/20/18.  She tolerated surgery well without any complaints or post-op deficits.  Since surgery she has been at home recovering, no recurrence of headaches.  One day last week she woke up "very confused" and unable to use left hand/arm in particular.  This gradually improved during the day until she was back at baseline by the evening; this "spell" has not recurred since that time.  She is currently off Keppra and did feel "drowsy" while on 537m twice per day.  She presents today to review pathology, prognosis, treatment options moving forward.  Medications: Current Outpatient Medications on File Prior to Visit  Medication  Sig Dispense Refill  . aspirin EC 81 MG tablet Take 81 mg by mouth daily.    . Calcium Carbonate-Vitamin D (CALCIUM + D PO) Take 3 tablets by mouth 2 (two) times daily.     . cholecalciferol (VITAMIN D) 400 UNITS TABS Take 4,000 Units by mouth daily.     .Marland Kitchendocusate sodium (COLACE) 100 MG capsule Take 100 mg by mouth 2 (two) times daily as needed for mild constipation.    . Glucosamine-Chondroit-Vit C-Mn (GLUCOSAMINE 1500 COMPLEX PO) Take 2 tablets by mouth 2 (two) times daily.      .Marland KitchenHYDROmorphone (DILAUDID) 2 MG tablet Take 1 tablet (2 mg total) by mouth every 4 (four) hours as needed for severe pain. 30 tablet 0  . Investigational Ramipril 1.25 MG capsule WF-1801 Take 1 capsule (1.25 mg total) by mouth at bedtime for 7 days. Take at bedtime with or without food. Drink adequate water to avoid dehydration. Avoid salt substitutes that are high in potassium. (Patient taking differently: Take 2.5 mg by mouth at bedtime. Take at bedtime with or without food. Drink adequate water to avoid dehydration. Avoid salt substitutes that are high in potassium.) 133 capsule 0  . levETIRAcetam (KEPPRA) 250 MG tablet Take 1 tablet (250 mg total) by mouth 2 (two) times daily. 60 tablet 3  . magnesium 30 MG tablet Take 90 mg by mouth every evening.    . ondansetron (ZOFRAN) 8 MG tablet Take 1 tablet (8 mg total) by mouth 2 (two) times daily as needed. Start on the third day after  chemotherapy. 30 tablet 1  . Red Yeast Rice Extract (RED YEAST RICE PO) Take 1 tablet by mouth daily.    Marland Kitchen temozolomide (TEMODAR) 100 MG capsule Take 1 capsule (100 mg total) by mouth daily. May take on an empty stomach to decrease nausea & vomiting. 42 capsule 0  . vitamin A 7500 UNIT capsule Take 7,500 Units by mouth daily.    . vitamin C (ASCORBIC ACID) 500 MG tablet Take 500 mg by mouth 2 (two) times daily.     . vitamin E 400 UNIT capsule Take 400 Units by mouth every other day.     . vitamin k 100 MCG tablet Take 100 mcg by mouth daily.        No current facility-administered medications on file prior to visit.     Allergies:  Allergies  Allergen Reactions  . Oxycodone-Acetaminophen Anaphylaxis and Nausea And Vomiting  . Codeine Other (See Comments)    constipation   Past Medical History:  Past Medical History:  Diagnosis Date  . Anxiety and depression   . Constipation   . DJD (degenerative joint disease)   . Family history of adverse reaction to anesthesia    Mother - "shock" - due to anesthesia-   . Hypothyroidism   . Increased urinary frequency    Dr. Matilde Sprang  . LEG CRAMPS, NOCTURNAL 02/22/2007   Qualifier: Diagnosis of  By: Larose Kells MD, [facility]   . Spinal stenosis   . Stress incontinence    (saw Urology 5-09)    . Symptomatic cholelithiasis 2017   reluctant to have surgery  . Urge and stress incontinence    Dr. Matilde Sprang   Past Surgical History:  Past Surgical History:  Procedure Laterality Date  . APPLICATION OF CRANIAL NAVIGATION N/A 03/20/2018   Procedure: APPLICATION OF CRANIAL NAVIGATION;  Surgeon: Ashok Pall, MD;  Location: [facility];  Service: Neurosurgery;  Laterality: N/A;  . BACK SURGERY  [phone]  . COLONOSCOPY    . CRANIOTOMY Right 03/20/2018   Procedure: Right Temporal craniotomy for tumor resection with brainlab;  Surgeon: Ashok Pall, MD;  Location: [facility];  Service: Neurosurgery;  Laterality: Right;  Right Temporal craniotomy for tumor resection with brainlab  . DILATION AND CURETTAGE OF UTERUS    . EYE SURGERY Bilateral    cataract   Social History:  Social History   Socioeconomic History  . Marital status: Married    Spouse name: Not on file  . Number of children: 3  . Years of education: Not on file  . Highest education level: Not on file  Occupational History  . Occupation: retired Pensions consultant: RETIRED  Social Needs  . Financial resource strain: Not on file  . Food insecurity:    Worry: Not on file    Inability: Not on file  .  Transportation needs:    Medical: No    Non-medical: No  Tobacco Use  . Smoking status: Never Smoker  . Smokeless tobacco: Never Used  Substance and Sexual Activity  . Alcohol use: Yes    Comment: Rare  . Drug use: No  . Sexual activity: Not on file  Lifestyle  . Physical activity:    Days per week: Not on file    Minutes per session: Not on file  . Stress: Not on file  Relationships  . Social connections:    Talks on phone: Not on file    Gets together: Not on file  Attends religious service: Not on file    Active member of club or organization: Not on file    Attends meetings of clubs or organizations: Not on file    Relationship status: Not on file  . Intimate partner violence:    Fear of current or ex partner: No    Emotionally abused: No    Physically abused: No    Forced sexual activity: No  Other Topics Concern  . Not on file  Social History Narrative   Household- pt and husband   Daughter bipolar   Family History:  Family History  Problem Relation Age of Onset  . Heart failure Mother   . Heart attack Mother 45       dx in her 70s  . Colon cancer Neg Hx   . Breast cancer Neg Hx   . Diabetes Neg Hx     Review of Systems: Constitutional: Denies fevers, chills or abnormal weight loss Eyes: Denies blurriness of vision Ears, nose, mouth, throat, and face: Denies mucositis or sore throat Respiratory: Denies cough, dyspnea or wheezes Cardiovascular: Denies palpitation, chest discomfort or lower extremity swelling Gastrointestinal:  Denies nausea, constipation, diarrhea GU: Denies dysuria or incontinence Skin: Denies abnormal skin rashes Neurological: Per HPI Musculoskeletal: Denies joint pain, back or neck discomfort. No decrease in ROM Behavioral/Psych: Denies anxiety, disturbance in thought content, and mood instability  Physical Exam: Vitals:   05/09/18 1403  BP: 132/79  Pulse: 72  Resp: 14  Temp: 97.6 F (36.4 C)  SpO2: 100%   KPS:  80. General: Alert, cooperative, pleasant, in no acute distress Head: Craniotomy scar noted, dry and intact. EENT: No conjunctival injection or scleral icterus. Oral mucosa moist Lungs: Resp effort normal Cardiac: Regular rate and rhythm Abdomen: Soft, non-distended abdomen Skin: No rashes cyanosis or petechiae. Extremities: No clubbing or edema  Neurologic Exam: Mental Status: Awake, alert, attentive to examiner. Oriented to self and environment. Language is fluent with intact comprehension.  Cranial Nerves: Right eye modest impairment of acuity. Visual fields are full. Extra-ocular movements intact. R eye ptosis. Face is symmetric, tongue midline. Motor: Tone and bulk are normal. Power is full in both arms and legs. Reflexes are symmetric, no pathologic reflexes present. Intact finger to nose bilaterally Sensory: Intact to light touch and temperature Gait: Independent  Labs: I have reviewed the data as listed    Component Value Date/Time   NA 141 04/25/2018 1340   K 4.0 04/25/2018 1340   CL 105 04/25/2018 1340   CO2 28 04/25/2018 1340   GLUCOSE 117 (H) 04/25/2018 1340   BUN 13 04/25/2018 1340   CREATININE 0.78 04/25/2018 1340   CALCIUM 9.6 04/25/2018 1340   PROT 6.7 04/25/2018 1340   ALBUMIN 3.5 04/25/2018 1340   AST 13 (L) 04/25/2018 1340   ALT 8 04/25/2018 1340   ALKPHOS 51 04/25/2018 1340   BILITOT 0.3 04/25/2018 1340   GFRNONAA >60 04/25/2018 1340   GFRAA >60 04/25/2018 1340   Lab Results  Component Value Date   WBC 5.3 05/09/2018   NEUTROABS 3.9 05/09/2018   HGB 10.2 (L) 05/09/2018   HCT 32.1 (L) 05/09/2018   MCV 95.0 05/09/2018   PLT 185 05/09/2018     Assessment/Plan 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 Lyons is clinically stable after 4 weeks of radiation therapy.  She may continue Temodar as prior until next lab check.  Will also continue with Ramipril per [id number] study.  Chemotherapy should be held for  the following:   ANC less than 1,000  Platelets less than 100,000  LFT or creatinine greater than 2x ULN  If clinical concerns/contraindications develop  We will continue to follow in 2 weeks during week 4 of radiation with labs for evaluation.    Recommended routine vision exam given impaired right eye acuity.  We appreciate the opportunity to participate in the care of [facility].   All questions were answered. The patient knows to call the clinic with any problems, questions or concerns. No barriers to learning were detected.  The total time spent in the encounter was 25 minutes and more than 50% was on counseling and review of test results   Ventura Sellers, MD Medical Director of Neuro-Oncology [facility] [place] 05/09/18 2:06 PM

## 2018-05-10 ENCOUNTER — Ambulatory Visit
Admission: RE | Admit: 2018-05-10 | Discharge: 2018-05-10 | Disposition: A | Discharge status: Home / Self Care | Payer: Medicare Other | Source: Ambulatory Visit | Attending: Radiation Oncology | Admitting: Radiation Oncology

## 2018-05-10 ENCOUNTER — Other Ambulatory Visit: Payer: Self-pay | Admitting: Medical Oncology

## 2018-05-10 ENCOUNTER — Telehealth: Payer: Self-pay

## 2018-05-10 DIAGNOSIS — C712 Malignant neoplasm of temporal lobe: Secondary | ICD-10-CM | POA: Diagnosis not present

## 2018-05-10 DIAGNOSIS — Z510 Encounter for antineoplastic radiation therapy: Secondary | ICD-10-CM | POA: Diagnosis not present

## 2018-05-10 DIAGNOSIS — C719 Malignant neoplasm of brain, unspecified: Secondary | ICD-10-CM

## 2018-05-10 NOTE — Telephone Encounter (Signed)
Per 1/25 no los °

## 2018-05-13 ENCOUNTER — Inpatient Hospital Stay: Payer: Medicare Other

## 2018-05-13 ENCOUNTER — Ambulatory Visit
Admission: RE | Admit: 2018-05-13 | Discharge: 2018-05-13 | Disposition: A | Discharge status: Home / Self Care | Payer: Medicare Other | Source: Ambulatory Visit | Attending: Radiation Oncology | Admitting: Radiation Oncology

## 2018-05-13 DIAGNOSIS — C712 Malignant neoplasm of temporal lobe: Secondary | ICD-10-CM | POA: Diagnosis not present

## 2018-05-13 DIAGNOSIS — Z510 Encounter for antineoplastic radiation therapy: Secondary | ICD-10-CM | POA: Diagnosis not present

## 2018-05-13 DIAGNOSIS — C719 Malignant neoplasm of brain, unspecified: Secondary | ICD-10-CM

## 2018-05-13 LAB — RESEARCH LABS

## 2018-05-14 ENCOUNTER — Encounter: Payer: Medicare Other | Admitting: Physical Therapy

## 2018-05-14 ENCOUNTER — Other Ambulatory Visit: Payer: Self-pay | Admitting: Internal Medicine

## 2018-05-14 ENCOUNTER — Encounter: Payer: Self-pay | Admitting: Medical Oncology

## 2018-05-14 ENCOUNTER — Ambulatory Visit
Admission: RE | Admit: 2018-05-14 | Discharge: 2018-05-14 | Disposition: A | Discharge status: Home / Self Care | Payer: Medicare Other | Source: Ambulatory Visit | Attending: Radiation Oncology | Admitting: Radiation Oncology

## 2018-05-14 DIAGNOSIS — C712 Malignant neoplasm of temporal lobe: Secondary | ICD-10-CM | POA: Diagnosis not present

## 2018-05-14 DIAGNOSIS — C719 Malignant neoplasm of brain, unspecified: Secondary | ICD-10-CM

## 2018-05-14 DIAGNOSIS — Z510 Encounter for antineoplastic radiation therapy: Secondary | ICD-10-CM | POA: Diagnosis not present

## 2018-05-14 MED FILL — TEMOZOLOMIDE 100 MG CAPS: 100 | 12 days supply | Qty: 12 | Fill #1

## 2018-05-14 NOTE — Progress Notes (Signed)
[id number]: Ramipril: End of Week 4, Start of Week 5 Patient and spouse here for her scheduled radiation treatment. Patient, spouse and I met just prior to her appointment. Patient provided me with her study medication bottle of Ramipril and Medication diary.  I assessed patient's blood pressure and it was at 134/76 today.  Patient confirms to be taking her Temodar daily as instructed and confirms to have continued with taking 4 tabs before bedtime of Ramipril with no missed doses for this past week cycle,  12/3- 12/9.  Ramipril: patient provided me with medication bottle with 54 capsules inside and no days missed per count. Second count verification by PharmD Carolynne Edouard. I returned the bottle, with the 54 capsules, to the patient to continue with taking for the next week.  Medication Diaries: I reviewed patient documentation of self administration and patient reports to have taken 4 tablets, as was instructed, for the past week.  Concomitant Medications: Reviewed and patient reports no new medications, including no new OTC conmeds or supplements.  Labs: Labs were collected on 12/9 and sent out to [facility] and I have received results this morning, with no concerning results. Dr. Mauri Reading has reviewed. Patient was informed of the lab results.  Adverse Events: Patient reports improvement to left hip pain and lower back, as well as improvement to nausea. Patient denies having any new issues or concerns.  Patient continues to be WNL assessment and within parameters to continue with 4 capsules of Ramilpril, for a total of 64m in the evening, reviewed with Dr. VMickeal Skinner Patient was informed to continue current dosage of 4 capsules (561m Ramipril at bedtime, and to continue documenting self administration. I reviewed with patient and spouse, again, what to do should a dose be missed, what medications to avoid and and the expected adverse events as well as when to call Dr. VaMickeal Skinnerr myself.  All patient and spouses  questions answered to their satisfaction. At the end of my visit with patient today, I reminded both patient and spouse that her labs will be assessed again on 12/16 and I will follow up with them on the 17th. Both patient and spouse were thanked for their time and their continued contribution to study and were encouraged to call Dr. VaMickeal Skinnerr myself with any questions or concerns they may have.  MiMaxwell MarionRN, BSN, CCStephens Memorial Hospitallinical Research 05/14/2018 4:18 PM   CaArmilda Vanderlindenoyal [id number]/03/2018  Adverse Event Log  Study/Protocol: WF 1801(Ramipril) Cycle: End of week 4- start of week 5  Baseline- blurred vision (gr 1) and general weakness (gr 1)  Event Grade Onset Date Resolved Date Drug Name Attribution Treatment Comments  Nausea (intermittent)  1 04/15/18 ongoing Ramilpril unrelated OTC ginger capsules   Left hip pain 2 ~05/02/18 ogoing Ramipril unrelated conmed Musculoskeletal  Lower back pain 1 05/07/18 ongoing Ramipril  unrelated  conmed Musculoskeletal

## 2018-05-15 ENCOUNTER — Ambulatory Visit
Admission: RE | Admit: 2018-05-15 | Discharge: 2018-05-15 | Disposition: A | Discharge status: Home / Self Care | Payer: Medicare Other | Source: Ambulatory Visit | Attending: Radiation Oncology | Admitting: Radiation Oncology

## 2018-05-15 ENCOUNTER — Encounter: Payer: Self-pay | Admitting: Internal Medicine

## 2018-05-15 ENCOUNTER — Other Ambulatory Visit: Payer: Self-pay | Admitting: Medical Oncology

## 2018-05-15 DIAGNOSIS — C719 Malignant neoplasm of brain, unspecified: Secondary | ICD-10-CM

## 2018-05-15 DIAGNOSIS — Z510 Encounter for antineoplastic radiation therapy: Secondary | ICD-10-CM | POA: Diagnosis not present

## 2018-05-15 DIAGNOSIS — C712 Malignant neoplasm of temporal lobe: Secondary | ICD-10-CM | POA: Diagnosis not present

## 2018-05-16 ENCOUNTER — Ambulatory Visit
Admission: RE | Admit: 2018-05-16 | Discharge: 2018-05-16 | Disposition: A | Discharge status: Home / Self Care | Payer: Medicare Other | Source: Ambulatory Visit | Attending: Radiation Oncology | Admitting: Radiation Oncology

## 2018-05-16 ENCOUNTER — Encounter: Payer: Medicare Other | Admitting: Physical Therapy

## 2018-05-16 DIAGNOSIS — C712 Malignant neoplasm of temporal lobe: Secondary | ICD-10-CM | POA: Diagnosis not present

## 2018-05-16 DIAGNOSIS — Z510 Encounter for antineoplastic radiation therapy: Secondary | ICD-10-CM | POA: Diagnosis not present

## 2018-05-17 ENCOUNTER — Ambulatory Visit
Admission: RE | Admit: 2018-05-17 | Discharge: 2018-05-17 | Disposition: A | Discharge status: Home / Self Care | Payer: Medicare Other | Source: Ambulatory Visit | Attending: Radiation Oncology | Admitting: Radiation Oncology

## 2018-05-17 DIAGNOSIS — Z510 Encounter for antineoplastic radiation therapy: Secondary | ICD-10-CM | POA: Diagnosis not present

## 2018-05-17 DIAGNOSIS — C712 Malignant neoplasm of temporal lobe: Secondary | ICD-10-CM | POA: Diagnosis not present

## 2018-05-20 ENCOUNTER — Inpatient Hospital Stay: Payer: Medicare Other

## 2018-05-20 ENCOUNTER — Ambulatory Visit
Admission: RE | Admit: 2018-05-20 | Discharge: 2018-05-20 | Disposition: A | Discharge status: Home / Self Care | Payer: Medicare Other | Source: Ambulatory Visit | Attending: Radiation Oncology | Admitting: Radiation Oncology

## 2018-05-20 DIAGNOSIS — Z510 Encounter for antineoplastic radiation therapy: Secondary | ICD-10-CM | POA: Diagnosis not present

## 2018-05-20 DIAGNOSIS — C712 Malignant neoplasm of temporal lobe: Secondary | ICD-10-CM | POA: Diagnosis not present

## 2018-05-20 DIAGNOSIS — C719 Malignant neoplasm of brain, unspecified: Secondary | ICD-10-CM

## 2018-05-20 LAB — RESEARCH LABS

## 2018-05-21 ENCOUNTER — Ambulatory Visit
Admission: RE | Admit: 2018-05-21 | Discharge: 2018-05-21 | Disposition: A | Discharge status: Home / Self Care | Payer: Medicare Other | Source: Ambulatory Visit | Attending: Radiation Oncology | Admitting: Radiation Oncology

## 2018-05-21 ENCOUNTER — Encounter: Payer: Self-pay | Admitting: Medical Oncology

## 2018-05-21 DIAGNOSIS — C712 Malignant neoplasm of temporal lobe: Secondary | ICD-10-CM | POA: Diagnosis not present

## 2018-05-21 DIAGNOSIS — Z510 Encounter for antineoplastic radiation therapy: Secondary | ICD-10-CM | POA: Diagnosis not present

## 2018-05-21 DIAGNOSIS — C719 Malignant neoplasm of brain, unspecified: Secondary | ICD-10-CM

## 2018-05-21 NOTE — Progress Notes (Signed)
[id number]: Ramipril: End of Week 5, Start of Week 6 Patient and spouse here for her scheduled radiation treatment. Patient, spouse and I met just prior to her appointment. Patient provided me with her study medication bottle of Ramipril and Medication diary.  I assessed patient's blood pressure and it was at 118/55 today.  Patient confirms to be taking her Temodar daily as instructed and confirms to have continued with taking 4 tabs before bedtime of Ramipril with no missed doses for this past week cycle,  12/10-12/16.  Ramipril: patient provided me with the medication bottle with 26 capsules inside and no days missed per count. Second count verification by PharmD Romualdo Bolk. I returned the bottle, with the remaining 26 capsules, to the patient to continue with taking for the next week. I also informed patient that I will dispense another two bottles to her tomorrow. Medication Diaries: I reviewed patient documentation of self administration and patient reports to have taken 4 tablets, as was instructed, for this past week.  Concomitant Medications: Reviewed and patient reports no new medications, including no new OTC conmeds or supplements.  Labs: Labs were collected on 12/16 and sent out to [facility] and I have received results this morning, with no concerning results. Dr. Mauri Reading has reviewed. Patient was informed of the lab results.  Adverse Events: Patient denies having any new issues or concerns.  Patient continues to be WNL assessment and within parameters to continue with 4 capsules of Ramilpril, for a total of 25m in the evening, reviewed with Dr. VMickeal Skinner Patient was informed to continue current dosage of 4 capsules (571m Ramipril at bedtime, and to continue documenting self administration. I reviewed with patient and spouse, again, what to do should a dose be missed, what medications to avoid and and the expected adverse events as well as when to call Dr. VaMickeal Skinnerr myself.  I informed patient that  it is at the time point to complete the 6 week neurocognitive assessments and questionnaires and patient asked if these could be completed tomorrow, I informed her that we could. All patient and spouses questions answered to their satisfaction. Both patient and spouse were thanked for their time and their continued contribution to study and were encouraged to call Dr. VaMickeal Skinnerr myself with any questions or concerns they may have.  MiMaxwell MarionRN, BSN, CCChristus Spohn Hospital Corpus Christilinical Research 05/21/2018 3:53 PM   CaPrakriti Carignanoyal [id number]/17/2019  Adverse Event Log  Study/Protocol: WF 1801(Ramipril) Cycle: End of week 5- start of week 6  Baseline- blurred vision (gr 1) and general weakness (gr 1)  Event Grade Onset Date Resolved Date Drug Name Attribution Treatment Comments  Nausea (intermittent)  1 04/15/18 ongoing Ramilpril unrelated OTC ginger capsules   Left hip pain 2 ~05/02/18 ogoing Ramipril unrelated conmed Musculoskeletal  Lower back pain 1 05/07/18 ongoing Ramipril  unrelated  conmed Musculoskeletal

## 2018-05-22 ENCOUNTER — Encounter: Payer: Self-pay | Admitting: Medical Oncology

## 2018-05-22 ENCOUNTER — Encounter: Payer: Self-pay | Admitting: Internal Medicine

## 2018-05-22 ENCOUNTER — Ambulatory Visit
Admission: RE | Admit: 2018-05-22 | Discharge: 2018-05-22 | Disposition: A | Discharge status: Home / Self Care | Payer: Medicare Other | Source: Ambulatory Visit | Attending: Radiation Oncology | Admitting: Radiation Oncology

## 2018-05-22 DIAGNOSIS — C712 Malignant neoplasm of temporal lobe: Secondary | ICD-10-CM | POA: Diagnosis not present

## 2018-05-22 DIAGNOSIS — C719 Malignant neoplasm of brain, unspecified: Secondary | ICD-10-CM

## 2018-05-22 DIAGNOSIS — Z510 Encounter for antineoplastic radiation therapy: Secondary | ICD-10-CM | POA: Diagnosis not present

## 2018-05-22 NOTE — Progress Notes (Signed)
[id number]: Ramipril: Start of Week 6, Neurocognitive Assessment Patient and spouse here for her scheduled radiation treatment. Patient, spouse and I met just prior to her appointment. I reminded patient that we will be completing the 6 week neurocognitive testing today after her radiation treatment appointment, patient gave verbal understanding.  Cognitive and PRO testing: patient successfully completed testing with research assistant Marie Haynes. Ramipiril: Patient was provided today with study drug rampiril, dispensed by PharmD Marie Haynes and given to patient by this nurse. I explained to patient and spouse that I am providing two bottles, each with 112 capsules for a total of 224 capsules of 1.25 mg to make sure that they have enough in case of bad weather and are unable to come to clinic next Monday. Bottles # 1 of 4 and # 2 of 4 provided, lot # (on both) [id number], exp 01/06/19. Patient was instructed to continue taking 4 capsules at bedtime and to continue documenting self administration. I reviewed with patient and spouse, again, what to do should a dose be missed, what medications to avoid and and the expected adverse events as well as when to call Dr. Mickeal Haynes or myself.  I asked patient to return her first study dispensed bottle next week Monday, they were also informed that there may be a lab appointment on Monday, that I need to clarify with study, and that I would call them to let them know if we would need to set a lab appointment. All patient and spouses questions answered to their satisfaction. Both patient and spouse were thanked for their time and their continued contribution to study and were encouraged to call Dr. Mickeal Haynes or myself with any questions or concerns they may have.  Maxwell Marion, RN, BSN, [facility] Clinical Research 05/22/2018 3:36 PM

## 2018-05-23 ENCOUNTER — Encounter: Payer: Self-pay | Admitting: Internal Medicine

## 2018-05-23 ENCOUNTER — Ambulatory Visit
Admission: RE | Admit: 2018-05-23 | Discharge: 2018-05-23 | Disposition: A | Discharge status: Home / Self Care | Payer: Medicare Other | Source: Ambulatory Visit | Attending: Radiation Oncology | Admitting: Radiation Oncology

## 2018-05-23 ENCOUNTER — Inpatient Hospital Stay (HOSPITAL_BASED_OUTPATIENT_CLINIC_OR_DEPARTMENT_OTHER): Payer: Medicare Other | Admitting: Internal Medicine

## 2018-05-23 ENCOUNTER — Inpatient Hospital Stay: Payer: Medicare Other

## 2018-05-23 VITALS — BP 134/65 | HR 73 | Temp 97.8°F | Resp 16 | Ht 63.0 in | Wt 115.0 lb

## 2018-05-23 DIAGNOSIS — M810 Age-related osteoporosis without current pathological fracture: Secondary | ICD-10-CM | POA: Diagnosis not present

## 2018-05-23 DIAGNOSIS — Z923 Personal history of irradiation: Secondary | ICD-10-CM | POA: Diagnosis not present

## 2018-05-23 DIAGNOSIS — C719 Malignant neoplasm of brain, unspecified: Secondary | ICD-10-CM | POA: Diagnosis not present

## 2018-05-23 DIAGNOSIS — E039 Hypothyroidism, unspecified: Secondary | ICD-10-CM | POA: Diagnosis not present

## 2018-05-23 DIAGNOSIS — N393 Stress incontinence (female) (male): Secondary | ICD-10-CM | POA: Diagnosis not present

## 2018-05-23 DIAGNOSIS — M1990 Unspecified osteoarthritis, unspecified site: Secondary | ICD-10-CM

## 2018-05-23 DIAGNOSIS — Z800 Family history of malignant neoplasm of digestive organs: Secondary | ICD-10-CM | POA: Diagnosis not present

## 2018-05-23 DIAGNOSIS — Z510 Encounter for antineoplastic radiation therapy: Secondary | ICD-10-CM | POA: Diagnosis not present

## 2018-05-23 DIAGNOSIS — F329 Major depressive disorder, single episode, unspecified: Secondary | ICD-10-CM

## 2018-05-23 DIAGNOSIS — Z7982 Long term (current) use of aspirin: Secondary | ICD-10-CM

## 2018-05-23 DIAGNOSIS — C712 Malignant neoplasm of temporal lobe: Secondary | ICD-10-CM | POA: Diagnosis not present

## 2018-05-23 DIAGNOSIS — Z79899 Other long term (current) drug therapy: Secondary | ICD-10-CM | POA: Diagnosis not present

## 2018-05-23 LAB — CBC WITH DIFFERENTIAL (CANCER CENTER ONLY)
Abs Immature Granulocytes: 0.01 10*3/uL (ref 0.00–0.07)
BASOS PCT: 1 %
Basophils Absolute: 0 10*3/uL (ref 0.0–0.1)
Eosinophils Absolute: 0.2 10*3/uL (ref 0.0–0.5)
Eosinophils Relative: 5 %
HCT: 35.4 % — ABNORMAL LOW (ref 36.0–46.0)
Hemoglobin: 11 g/dL — ABNORMAL LOW (ref 12.0–15.0)
Immature Granulocytes: 0 %
Lymphocytes Relative: 8 %
Lymphs Abs: 0.3 10*3/uL — ABNORMAL LOW (ref 0.7–4.0)
MCH: 30.1 pg (ref 26.0–34.0)
MCHC: 31.1 g/dL (ref 30.0–36.0)
MCV: 96.7 fL (ref 80.0–100.0)
MONOS PCT: 12 %
Monocytes Absolute: 0.5 10*3/uL (ref 0.1–1.0)
NEUTROS PCT: 74 %
Neutro Abs: 3.2 10*3/uL (ref 1.7–7.7)
PLATELETS: 142 10*3/uL — AB (ref 150–400)
RBC: 3.66 MIL/uL — ABNORMAL LOW (ref 3.87–5.11)
RDW: 13.8 % (ref 11.5–15.5)
WBC Count: 4.3 10*3/uL (ref 4.0–10.5)
nRBC: 0 % (ref 0.0–0.2)

## 2018-05-23 LAB — CMP (CANCER CENTER ONLY)
ALBUMIN: 3.7 g/dL (ref 3.5–5.0)
ALT: 9 U/L (ref 0–44)
ANION GAP: 10 (ref 5–15)
AST: 13 U/L — ABNORMAL LOW (ref 15–41)
Alkaline Phosphatase: 41 U/L (ref 38–126)
BUN: 16 mg/dL (ref 8–23)
CO2: 28 mmol/L (ref 22–32)
Calcium: 9.5 mg/dL (ref 8.9–10.3)
Chloride: 102 mmol/L (ref 98–111)
Creatinine: 0.71 mg/dL (ref 0.44–1.00)
GFR, Est AFR Am: >60 mL/min (ref >60)
GFR, Estimated: >60 mL/min (ref >60)
GLUCOSE: 85 mg/dL (ref 70–99)
Potassium: 4.4 mmol/L (ref 3.5–5.1)
Sodium: 140 mmol/L (ref 135–145)
Total Bilirubin: 0.3 mg/dL (ref 0.3–1.2)
Total Protein: 6.7 g/dL (ref 6.5–8.1)

## 2018-05-23 NOTE — Progress Notes (Signed)
[facility] [place], [place] [address] [phone]   Interval Evaluation  Date of Service: 05/23/18 Patient Name: Marie Haynes Patient MRN: [id number] Patient DOB: 17-Dec-1931 Provider: Ventura Sellers, MD  Identifying Statement:  Marie Haynes is a 84 y.o. 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 Christella Noa    04/08/2018 -  Chemotherapy    The patient had [No matching medication found in this treatment plan]  for chemotherapy treatment.      Biomarkers:  MGMT Unknown.  IDH 1/2 Wild type.  EGFR Unknown  TERT Unknown   Interval History:  Marie Haynes presents for follow up today, now in final week of radiation therapy.  She describes no new or progressive neurologic deficits.  She denies seizures or recurrent headaches.  No issues with study drug.  H+P (04/05/18) Patient presented to medical attention several weeks ago with new onset severe headache, which was a new complaint for her.  CT demonstrated a brain mass, and follow up MRI confirmed likely neoplasm.  After review by neurosurgery, she underwent craniotomy and resection of the mass with Dr. Christella Noa on 03/20/18.  She tolerated surgery well without any complaints or post-op deficits.  Since surgery she has been at home recovering, no recurrence of headaches.  One day last week she woke up "very confused" and unable to use left hand/arm in particular.  This gradually improved during the day until she was back at baseline by the evening; this "spell" has not recurred since that time.  She is currently off Keppra and did feel "drowsy" while on 511m twice per day.  She presents today to review pathology, prognosis, treatment options moving forward.  Medications: Current Outpatient Medications on File Prior to Visit  Medication  Sig Dispense Refill  . aspirin EC 81 MG tablet Take 81 mg by mouth daily.    . Calcium Carbonate-Vitamin D (CALCIUM + D PO) Take 3 tablets by mouth 2 (two) times daily.     . cholecalciferol (VITAMIN D) 400 UNITS TABS Take 4,000 Units by mouth daily.     .Marland Kitchendocusate sodium (COLACE) 100 MG capsule Take 100 mg by mouth 2 (two) times daily as needed for mild constipation.    . Glucosamine-Chondroit-Vit C-Mn (GLUCOSAMINE 1500 COMPLEX PO) Take 2 tablets by mouth 2 (two) times daily.      .Marland KitchenHYDROmorphone (DILAUDID) 2 MG tablet Take 1 tablet (2 mg total) by mouth every 4 (four) hours as needed for severe pain. 30 tablet 0  . Investigational Ramipril 1.25 MG capsule WF-1801 Take 1 capsule (1.25 mg total) by mouth at bedtime for 7 days. Take at bedtime with or without food. Drink adequate water to avoid dehydration. Avoid salt substitutes that are high in potassium. (Patient taking differently: Take 2.5 mg by mouth at bedtime. Take at bedtime with or without food. Drink adequate water to avoid dehydration. Avoid salt substitutes that are high in potassium.) 133 capsule 0  . levETIRAcetam (KEPPRA) 250 MG tablet Take 1 tablet (250 mg total) by mouth 2 (two) times daily. 60 tablet 3  . levothyroxine (SYNTHROID, LEVOTHROID) 75 MCG tablet Take 1 tablet (75 mcg total) by mouth daily before breakfast. 30 tablet 1  . magnesium 30 MG tablet Take 90 mg by mouth every evening.    . ondansetron (ZOFRAN) 8  MG tablet Take 1 tablet (8 mg total) by mouth 2 (two) times daily as needed. Start on the third day after chemotherapy. 30 tablet 1  . Red Yeast Rice Extract (RED YEAST RICE PO) Take 1 tablet by mouth daily.    Marland Kitchen temozolomide (TEMODAR) 100 MG capsule Take 1 capsule (100 mg total) by mouth daily. May take on an empty stomach to decrease nausea & vomiting. 42 capsule 0  . vitamin A 7500 UNIT capsule Take 7,500 Units by mouth daily.    . vitamin C (ASCORBIC ACID) 500 MG tablet Take 500 mg by mouth 2 (two) times daily.     .  vitamin E 400 UNIT capsule Take 400 Units by mouth every other day.     . vitamin k 100 MCG tablet Take 100 mcg by mouth daily.       No current facility-administered medications on file prior to visit.     Allergies:  Allergies  Allergen Reactions  . Oxycodone-Acetaminophen Anaphylaxis and Nausea And Vomiting  . Codeine Other (See Comments)    constipation   Past Medical History:  Past Medical History:  Diagnosis Date  . Anxiety and depression   . Constipation   . DJD (degenerative joint disease)   . Family history of adverse reaction to anesthesia    Mother - "shock" - due to anesthesia-   . Hypothyroidism   . Increased urinary frequency    Dr. Matilde Sprang  . LEG CRAMPS, NOCTURNAL 02/22/2007   Qualifier: Diagnosis of  By: Larose Kells MD, [facility]   . Spinal stenosis   . Stress incontinence    (saw Urology 5-09)    . Symptomatic cholelithiasis 2017   reluctant to have surgery  . Urge and stress incontinence    Dr. Matilde Sprang   Past Surgical History:  Past Surgical History:  Procedure Laterality Date  . APPLICATION OF CRANIAL NAVIGATION N/A 03/20/2018   Procedure: APPLICATION OF CRANIAL NAVIGATION;  Surgeon: Ashok Pall, MD;  Location: [facility];  Service: Neurosurgery;  Laterality: N/A;  . BACK SURGERY  [phone]  . COLONOSCOPY    . CRANIOTOMY Right 03/20/2018   Procedure: Right Temporal craniotomy for tumor resection with brainlab;  Surgeon: Ashok Pall, MD;  Location: [facility];  Service: Neurosurgery;  Laterality: Right;  Right Temporal craniotomy for tumor resection with brainlab  . DILATION AND CURETTAGE OF UTERUS    . EYE SURGERY Bilateral    cataract   Social History:  Social History   Socioeconomic History  . Marital status: Married    Spouse name: Not on file  . Number of children: 3  . Years of education: Not on file  . Highest education level: Not on file  Occupational History  . Occupation: retired Pensions consultant: RETIRED  Social  Needs  . Financial resource strain: Not on file  . Food insecurity:    Worry: Not on file    Inability: Not on file  . Transportation needs:    Medical: No    Non-medical: No  Tobacco Use  . Smoking status: Never Smoker  . Smokeless tobacco: Never Used  Substance and Sexual Activity  . Alcohol use: Yes    Comment: Rare  . Drug use: No  . Sexual activity: Not on file  Lifestyle  . Physical activity:    Days per week: Not on file    Minutes per session: Not on file  . Stress: Not on file  Relationships  .  Social connections:    Talks on phone: Not on file    Gets together: Not on file    Attends religious service: Not on file    Active member of club or organization: Not on file    Attends meetings of clubs or organizations: Not on file    Relationship status: Not on file  . Intimate partner violence:    Fear of current or ex partner: No    Emotionally abused: No    Physically abused: No    Forced sexual activity: No  Other Topics Concern  . Not on file  Social History Narrative   Household- pt and husband   Daughter bipolar   Family History:  Family History  Problem Relation Age of Onset  . Heart failure Mother   . Heart attack Mother 48       dx in her 32s  . Colon cancer Neg Hx   . Breast cancer Neg Hx   . Diabetes Neg Hx     Review of Systems: Constitutional: Denies fevers, chills or abnormal weight loss Eyes: Denies blurriness of vision Ears, nose, mouth, throat, and face: Denies mucositis or sore throat Respiratory: Denies cough, dyspnea or wheezes Cardiovascular: Denies palpitation, chest discomfort or lower extremity swelling Gastrointestinal:  Denies nausea, constipation, diarrhea GU: Denies dysuria or incontinence Skin: Denies abnormal skin rashes Neurological: Per HPI Musculoskeletal: Denies joint pain, back or neck discomfort. No decrease in ROM Behavioral/Psych: Denies anxiety, disturbance in thought content, and mood instability  Physical  Exam: Vitals:   05/23/18 1410  BP: 134/65  Pulse: 73  Resp: 16  Temp: 97.8 F (36.6 C)  SpO2: 100%   KPS: 80. General: Alert, cooperative, pleasant, in no acute distress Head: Craniotomy scar noted, dry and intact. EENT: No conjunctival injection or scleral icterus. Oral mucosa moist Lungs: Resp effort normal Cardiac: Regular rate and rhythm Abdomen: Soft, non-distended abdomen Skin: No rashes cyanosis or petechiae. Extremities: No clubbing or edema  Neurologic Exam: Mental Status: Awake, alert, attentive to examiner. Oriented to self and environment. Language is fluent with intact comprehension.  Cranial Nerves: Right eye modest impairment of acuity. Visual fields are full. Extra-ocular movements intact. R eye ptosis. Face is symmetric, tongue midline. Motor: Tone and bulk are normal. Power is full in both arms and legs. Reflexes are symmetric, no pathologic reflexes present. Intact finger to nose bilaterally Sensory: Intact to light touch and temperature Gait: Independent  Labs: I have reviewed the data as listed    Component Value Date/Time   NA 140 05/23/2018 1356   K 4.4 05/23/2018 1356   CL 102 05/23/2018 1356   CO2 28 05/23/2018 1356   GLUCOSE 85 05/23/2018 1356   BUN 16 05/23/2018 1356   CREATININE 0.71 05/23/2018 1356   CALCIUM 9.5 05/23/2018 1356   PROT 6.7 05/23/2018 1356   ALBUMIN 3.7 05/23/2018 1356   AST 13 (L) 05/23/2018 1356   ALT 9 05/23/2018 1356   ALKPHOS 41 05/23/2018 1356   BILITOT 0.3 05/23/2018 1356   GFRNONAA >60 05/23/2018 1356   GFRAA >60 05/23/2018 1356   Lab Results  Component Value Date   WBC 4.3 05/23/2018   NEUTROABS 3.2 05/23/2018   HGB 11.0 (L) 05/23/2018   HCT 35.4 (L) 05/23/2018   MCV 96.7 05/23/2018   PLT 142 (L) 05/23/2018     Assessment/Plan 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 Humble is clinically stable after 6 weeks of radiation therapy.  She may continue  Temodar through the end of RT  on 12/23.  Will also continue with Ramipril per [id number] study.  Chemotherapy should be held for the following:  ANC less than 1,000  Platelets less than 100,000  LFT or creatinine greater than 2x ULN  If clinical concerns/contraindications develop  We will continue to follow in 3 weeks with MRI brain for evaluation.  Ok to use hydrocortisone 1% cream as needed for skin irritation from Temodar.   We appreciate the opportunity to participate in the care of [facility].   All questions were answered. The patient knows to call the clinic with any problems, questions or concerns. No barriers to learning were detected.  The total time spent in the encounter was 25 minutes and more than 50% was on counseling and review of test results   Ventura Sellers, MD Medical Director of Neuro-Oncology [facility] at [place] 05/23/18 2:21 PM

## 2018-05-24 ENCOUNTER — Ambulatory Visit
Admission: RE | Admit: 2018-05-24 | Discharge: 2018-05-24 | Disposition: A | Discharge status: Home / Self Care | Payer: Medicare Other | Source: Ambulatory Visit | Attending: Radiation Oncology | Admitting: Radiation Oncology

## 2018-05-24 ENCOUNTER — Telehealth: Payer: Self-pay

## 2018-05-24 DIAGNOSIS — Z510 Encounter for antineoplastic radiation therapy: Secondary | ICD-10-CM | POA: Diagnosis not present

## 2018-05-24 DIAGNOSIS — C712 Malignant neoplasm of temporal lobe: Secondary | ICD-10-CM | POA: Diagnosis not present

## 2018-05-24 NOTE — Telephone Encounter (Signed)
Per 20/19 los mailed a letter with a calender enclosed

## 2018-05-27 ENCOUNTER — Ambulatory Visit
Admission: RE | Admit: 2018-05-27 | Discharge: 2018-05-27 | Disposition: A | Discharge status: Home / Self Care | Payer: Medicare Other | Source: Ambulatory Visit | Attending: Radiation Oncology | Admitting: Radiation Oncology

## 2018-05-27 ENCOUNTER — Other Ambulatory Visit: Payer: Self-pay | Admitting: Medical Oncology

## 2018-05-27 ENCOUNTER — Ambulatory Visit: Payer: Medicare Other

## 2018-05-27 ENCOUNTER — Inpatient Hospital Stay: Payer: Medicare Other

## 2018-05-27 ENCOUNTER — Encounter: Payer: Self-pay | Admitting: Medical Oncology

## 2018-05-27 DIAGNOSIS — C712 Malignant neoplasm of temporal lobe: Secondary | ICD-10-CM | POA: Diagnosis not present

## 2018-05-27 DIAGNOSIS — C719 Malignant neoplasm of brain, unspecified: Secondary | ICD-10-CM

## 2018-05-27 DIAGNOSIS — Z510 Encounter for antineoplastic radiation therapy: Secondary | ICD-10-CM | POA: Diagnosis not present

## 2018-05-27 MED ORDER — SONAFINE EX EMUL
1.0000 "application " | Freq: Once | CUTANEOUS | Status: AC
  Administered 2018-05-27: 1 via TOPICAL

## 2018-05-27 NOTE — Progress Notes (Signed)
[id number]: Ramipril: End of Week 6 and Radiation Therapy (start of week 7) Patient and spouse here for her scheduled radiation treatment, today is patient's final treatment. Patient, spouse and I met just prior to her appointment. Patient provided me with her study medication bottle of Ramipril and Medication diary.  I assessed patient's blood pressure and it was at 132/85 today.  Patient confirms to be taking her Temodar daily and last dosage was yesterday, until she is informed to restart at her next clinic visit with Dr. Mickeal Skinner. Patient also confirms to have been taking 4 tabs before bedtime of Ramipril with no missed doses for this past week cycle,  12/17-12/22, today's dose to be taken tonight at bedtime.  Ramipril: patient returned her first study dispensed medication bottle, with 2 capsules inside,  and no days missed per count. Second count verification and collection of Ramipril for documentation of return by PharmD [facility]. Patient has two bottles that were dispensed to her at last visit and patient was instructed to start with bottle number 1 of 4. Medication Diaries: I reviewed patient documentation of self administration and patient reports to have taken 4 tablets, as was instructed, for this past week. Patient was provided with new medication diaries for January, February and March of 2020. Patient was also provided with a paid stamped envelope to mail back her December diary when completed.  Concomitant Medications: Reviewed and patient reports no new medications, including no new OTC conmeds or supplements.  Labs: Labs were collected on 12/23  sent out to [facility] and are pending.  Adverse Events: Patient reports to been having skin itching for the past month, per her report today, related to the Temodar. Patient inquired if she can take an antihistamine, I verified with Dr. Renda Rolls nurse that it would be ok and advised patient to start at 12.5 mg due to the side effect of drowsiness, to see  how she feels.  Patient continues to be WNL assessment and within parameters to continue with 4 capsules of Ramilpril, for a total of 11m in the evening, reviewed with Dr. VMickeal Skinner Patient was informed to continue current dosage of 4 capsules (553m Ramipril at bedtime, and to continue documenting self administration. I reviewed with patient and spouse, again, what to do should a dose be missed, what medications to avoid and and the expected adverse events as well as when to call Dr. VaMickeal Skinnerr myself. Patient was informed that her next study visit will be on January 20th. All patient's and spouse's questions answered to their satisfaction. Patient was thanked for her continued support of study and was encouraged to call with questions.   MiMaxwell MarionRN, BSN, CCVa [facility] 05/27/2018 3:01 PM   CaMakinzee Durleyoyal [phone]/23/2019  Adverse Event Log  Study/Protocol: WF 1801(Ramipril) Cycle: End of week 6- start of week 7 (End of Radiation Therapy) Baseline- blurred vision (gr 1) and general weakness (gr 1)  Event Grade Onset Date Resolved Date Drug Name Attribution Treatment Comments  Nausea (intermittent)  1 04/15/18 ongoing Ramilpril unrelated OTC ginger capsules   Left hip pain 2 ~05/02/18 ogoing Ramipril unrelated conmed Musculoskeletal  Lower back pain 1 05/07/18 ongoing Ramipril  unrelated  conmed Musculoskeletal  Pruritus  1 ~04/2018 ongoing Ramipril definite conmed

## 2018-05-28 ENCOUNTER — Ambulatory Visit: Payer: Medicare Other

## 2018-05-30 ENCOUNTER — Ambulatory Visit: Payer: Medicare Other

## 2018-05-30 ENCOUNTER — Other Ambulatory Visit: Payer: Self-pay | Admitting: Radiation Therapy

## 2018-05-31 ENCOUNTER — Ambulatory Visit: Payer: Medicare Other

## 2018-05-31 LAB — RESEARCH LABS

## 2018-06-03 ENCOUNTER — Ambulatory Visit: Payer: Medicare Other

## 2018-06-04 ENCOUNTER — Ambulatory Visit: Payer: Medicare Other

## 2018-06-05 ENCOUNTER — Ambulatory Visit: Payer: Medicare Other

## 2018-06-06 ENCOUNTER — Ambulatory Visit: Payer: Medicare Other

## 2018-06-07 ENCOUNTER — Ambulatory Visit: Payer: Medicare Other

## 2018-06-11 ENCOUNTER — Encounter: Payer: Self-pay | Admitting: Radiation Oncology

## 2018-06-11 NOTE — Progress Notes (Addendum)
  Radiation Oncology         [phone] ________________________________  Name: Marie Haynes MRN: [id number]  Date: 06/11/2018  DOB: [birth year]  End of Treatment Note  Diagnosis:   Right Temporal Glioblastoma, Grade IV  Indication for treatment:  Curative       Radiation treatment dates:   04/15/18 - 05/27/18  Site/dose:    1) Right temporal lobe tumor bed initial / 46 Gy in 23 fractions 2) Right temporal lobe tumor bed boost / 14 Gy in 7 fractions  Beams/energy:    1) IMRT // 6X 2) IMRT // 6X  Narrative: The patient tolerated radiation treatment relatively well. She experienced alopecia and tenderness/soreness to her scalp. She noted right eye blurriness since her surgery that continued throughout treatment. She reported some double vision with associated gait changes, moderate fatigue, and some weakness. She denied fine motor issues, ear fullness or auditory changes, and slurred speech.    Plan: The patient has completed radiation treatment. The patient will return to radiation oncology clinic for routine followup in one month. I advised them to call or return sooner if they have any questions or concerns related to their recovery or treatment.  -----------------------------------  Eppie Gibson, MD  This document serves as a record of services personally performed by Eppie Gibson, MD. It was created on her behalf by Wilburn Mylar, a trained medical scribe. The creation of this record is based on the scribe's personal observations and the provider's statements to them. This document has been checked and approved by the attending provider.

## 2018-06-12 ENCOUNTER — Other Ambulatory Visit: Payer: Self-pay | Admitting: *Deleted

## 2018-06-18 NOTE — Progress Notes (Addendum)
[id number]: Addendum to patient's A/E report.  Patient's pruritus is related to temodar not to study drug Ramipril.  Maxwell Marion, RN, BSN, [facility] Clinical Research 06/18/2018 11:04 AM

## 2018-06-19 ENCOUNTER — Other Ambulatory Visit: Payer: Self-pay | Admitting: Radiation Therapy

## 2018-06-21 ENCOUNTER — Ambulatory Visit (HOSPITAL_COMMUNITY)
Admission: RE | Admit: 2018-06-21 | Discharge: 2018-06-21 | Disposition: A | Discharge status: Home / Self Care | Payer: Medicare Other | Source: Ambulatory Visit | Attending: Internal Medicine | Admitting: Internal Medicine

## 2018-06-21 ENCOUNTER — Other Ambulatory Visit: Payer: Self-pay | Admitting: Medical Oncology

## 2018-06-21 ENCOUNTER — Telehealth: Payer: Self-pay | Admitting: Medical Oncology

## 2018-06-21 DIAGNOSIS — C719 Malignant neoplasm of brain, unspecified: Secondary | ICD-10-CM | POA: Insufficient documentation

## 2018-06-21 DIAGNOSIS — G9389 Other specified disorders of brain: Secondary | ICD-10-CM | POA: Diagnosis not present

## 2018-06-21 MED ORDER — GADOBUTROL 1 MMOL/ML IV SOLN
5.0000 mL | Freq: Once | INTRAVENOUS | Status: AC | PRN
  Administered 2018-06-21: 5 mL via INTRAVENOUS

## 2018-06-21 NOTE — Telephone Encounter (Signed)
[id number] LVMOM with spouse regarding 46-month post RT appt on Monday 1/20. Called to remind patient about the study lab and assessments, including neurocognitives, for Monday. I asked spouse to bring in both Ramipril medication bottles for pill count as well as the medication diaries. Asked spouse to return call prior to appt, number provided. Thanked for continued support of study. Maxwell Marion, RN, BSN, [facility] Clinical Research 06/21/2018 2:46 PM

## 2018-06-24 ENCOUNTER — Inpatient Hospital Stay: Payer: Medicare Other | Attending: Internal Medicine | Admitting: Internal Medicine

## 2018-06-24 ENCOUNTER — Encounter: Payer: Self-pay | Admitting: Medical Oncology

## 2018-06-24 ENCOUNTER — Telehealth: Payer: Self-pay | Admitting: Internal Medicine

## 2018-06-24 ENCOUNTER — Encounter: Payer: Self-pay | Admitting: Internal Medicine

## 2018-06-24 ENCOUNTER — Inpatient Hospital Stay: Payer: Medicare Other

## 2018-06-24 VITALS — BP 135/65 | HR 67 | Temp 97.4°F | Resp 17 | Ht 63.0 in | Wt 113.3 lb

## 2018-06-24 DIAGNOSIS — Z006 Encounter for examination for normal comparison and control in clinical research program: Secondary | ICD-10-CM | POA: Insufficient documentation

## 2018-06-24 DIAGNOSIS — R9089 Other abnormal findings on diagnostic imaging of central nervous system: Secondary | ICD-10-CM | POA: Diagnosis not present

## 2018-06-24 DIAGNOSIS — Z923 Personal history of irradiation: Secondary | ICD-10-CM | POA: Diagnosis not present

## 2018-06-24 DIAGNOSIS — C719 Malignant neoplasm of brain, unspecified: Secondary | ICD-10-CM | POA: Insufficient documentation

## 2018-06-24 DIAGNOSIS — Z79899 Other long term (current) drug therapy: Secondary | ICD-10-CM | POA: Diagnosis not present

## 2018-06-24 LAB — CBC WITH DIFFERENTIAL (CANCER CENTER ONLY)
Abs Immature Granulocytes: 0.01 10*3/uL (ref 0.00–0.07)
BASOS ABS: 0 10*3/uL (ref 0.0–0.1)
Basophils Relative: 1 %
EOS ABS: 0.2 10*3/uL (ref 0.0–0.5)
Eosinophils Relative: 6 %
HEMATOCRIT: 36.2 % (ref 36.0–46.0)
Hemoglobin: 11.6 g/dL — ABNORMAL LOW (ref 12.0–15.0)
Immature Granulocytes: 0 %
Lymphocytes Relative: 8 %
Lymphs Abs: 0.2 10*3/uL — ABNORMAL LOW (ref 0.7–4.0)
MCH: 30.3 pg (ref 26.0–34.0)
MCHC: 32 g/dL (ref 30.0–36.0)
MCV: 94.5 fL (ref 80.0–100.0)
Monocytes Absolute: 0.5 10*3/uL (ref 0.1–1.0)
Monocytes Relative: 17 %
Neutro Abs: 2.1 10*3/uL (ref 1.7–7.7)
Neutrophils Relative %: 68 %
Platelet Count: 138 10*3/uL — ABNORMAL LOW (ref 150–400)
RBC: 3.83 MIL/uL — ABNORMAL LOW (ref 3.87–5.11)
RDW: 14 % (ref 11.5–15.5)
WBC Count: 3.1 10*3/uL — ABNORMAL LOW (ref 4.0–10.5)
nRBC: 0 % (ref 0.0–0.2)

## 2018-06-24 LAB — CMP (CANCER CENTER ONLY)
ALK PHOS: 44 U/L (ref 38–126)
ALT: 8 U/L (ref 0–44)
AST: 10 U/L — ABNORMAL LOW (ref 15–41)
Albumin: 3.9 g/dL (ref 3.5–5.0)
Anion gap: 11 (ref 5–15)
BILIRUBIN TOTAL: 0.3 mg/dL (ref 0.3–1.2)
BUN: 8 mg/dL (ref 8–23)
CO2: 29 mmol/L (ref 22–32)
Calcium: 9.9 mg/dL (ref 8.9–10.3)
Chloride: 101 mmol/L (ref 98–111)
Creatinine: 0.78 mg/dL (ref 0.44–1.00)
GFR, Est AFR Am: >60 mL/min (ref >60)
GFR, Estimated: >60 mL/min (ref >60)
Glucose, Bld: 94 mg/dL (ref 70–99)
Potassium: 4.2 mmol/L (ref 3.5–5.1)
Sodium: 141 mmol/L (ref 135–145)
TOTAL PROTEIN: 7.1 g/dL (ref 6.5–8.1)

## 2018-06-24 MED ORDER — TEMOZOLOMIDE 20 MG PO CAPS: 20.0000 mg | ORAL_CAPSULE | Freq: Every day | ORAL | 0 refills | Status: DC

## 2018-06-24 MED ORDER — TEMOZOLOMIDE 100 MG PO CAPS: 200.0000 mg | ORAL_CAPSULE | Freq: Every day | ORAL | 0 refills | Status: DC

## 2018-06-24 MED ORDER — ONDANSETRON HCL 8 MG PO TABS: 8.0000 mg | ORAL_TABLET | Freq: Two times a day (BID) | ORAL | 1 refills | Status: DC | PRN

## 2018-06-24 NOTE — Telephone Encounter (Signed)
Gave patient avs report and appointments for February.  °

## 2018-06-24 NOTE — Progress Notes (Signed)
DISCONTINUE ON PATHWAY REGIMEN - Neuro     One cycle, daily for 42 days concurrent with RT:     Temozolomide   **Always confirm dose/schedule in your pharmacy ordering system**  REASON: Continuation Of Treatment PRIOR TREATMENT: [id number]: Radiation Therapy with Concurrent Temozolomide 75 mg/m2 Daily x 6 Weeks, Followed by Sequential Temozolomide TREATMENT RESPONSE: Stable Disease (SD)  START ON PATHWAY REGIMEN - Neuro     A cycle is every 28 days:     Temozolomide      Temozolomide   **Always confirm dose/schedule in your pharmacy ordering system**  Patient Characteristics: Glioblastoma, Newly Diagnosed / Treatment Naive, Good Performance Status and/or Younger Patient, MGMT Promoter Unmethylated/Unknown Disease Classification: Glioma Disease Classification: Glioblastoma Disease Status: Newly Diagnosed / Treatment Naive Performance Status: Good Performance Status and/or Younger Patient MGMT Promoter Methylation Status: Awaiting Test Results Intent of Therapy: Non-Curative / Palliative Intent, Discussed with Patient 

## 2018-06-24 NOTE — Progress Notes (Signed)
[facility] [place], [place] [address] [phone]   Interval Evaluation  Date of Service: 06/24/18 Patient Name: Marie Haynes Patient MRN: [id number] Patient DOB: Feb 20, 1932 Provider: Ventura Sellers, MD  Identifying Statement:  Marie Haynes is a 85 y.o. female with right temporal glioblastoma   Oncologic History:   Glioblastoma with isocitrate dehydrogenase gene wildtype ([place])   03/20/2018 Surgery    Craniotomy, resection with Dr. Christella Noa     - 05/28/2018 Radiation Therapy    Completes IMRT with concurrent Temodar     Biomarkers:  MGMT Unknown.  IDH 1/2 Wild type.  EGFR Unknown  TERT Unknown   Interval History:  Marie Haynes presents for follow up today, now having completed radiation therapy.  She describes no new or progressive neurologic deficits.  She denies seizures or recurrent headaches.  No issues with study drug.  H+P (04/05/18) Patient presented to medical attention several weeks ago with new onset severe headache, which was a new complaint for her.  CT demonstrated a brain mass, and follow up MRI confirmed likely neoplasm.  After review by neurosurgery, she underwent craniotomy and resection of the mass with Dr. Christella Noa on 03/20/18.  She tolerated surgery well without any complaints or post-op deficits.  Since surgery she has been at home recovering, no recurrence of headaches.  One day last week she woke up "very confused" and unable to use left hand/arm in particular.  This gradually improved during the day until she was back at baseline by the evening; this "spell" has not recurred since that time.  She is currently off Keppra and did feel "drowsy" while on 556m twice per day.  She presents today to review pathology, prognosis, treatment options moving forward.  Medications: Current Outpatient Medications on File Prior to Visit  Medication Sig Dispense Refill  . aspirin EC 81 MG tablet Take 81 mg  by mouth daily.    . Calcium Carbonate-Vitamin D (CALCIUM + D PO) Take 3 tablets by mouth 2 (two) times daily.     . cholecalciferol (VITAMIN D) 400 UNITS TABS Take 4,000 Units by mouth daily.     .Marland Kitchendocusate sodium (COLACE) 100 MG capsule Take 100 mg by mouth 2 (two) times daily as needed for mild constipation.    . Glucosamine-Chondroit-Vit C-Mn (GLUCOSAMINE 1500 COMPLEX PO) Take 2 tablets by mouth 2 (two) times daily.      . Investigational Ramipril 1.25 MG capsule WF-1801 Take 1 capsule (1.25 mg total) by mouth at bedtime for 7 days. Take at bedtime with or without food. Drink adequate water to avoid dehydration. Avoid salt substitutes that are high in potassium. (Patient taking differently: Take 2.5 mg by mouth at bedtime. Take at bedtime with or without food. Drink adequate water to avoid dehydration. Avoid salt substitutes that are high in potassium.) 133 capsule 0  . levETIRAcetam (KEPPRA) 250 MG tablet Take 1 tablet (250 mg total) by mouth 2 (two) times daily. 60 tablet 3  . levothyroxine (SYNTHROID, LEVOTHROID) 75 MCG tablet Take 1 tablet (75 mcg total) by mouth daily before breakfast. 30 tablet 1  . magnesium 30 MG tablet Take 90 mg by mouth every evening.    . Red Yeast Rice Extract (RED YEAST RICE PO) Take 1 tablet by mouth daily.    . vitamin A 7500 UNIT capsule Take 7,500 Units by mouth daily.    . vitamin C (ASCORBIC ACID) 500 MG tablet Take 500  mg by mouth 2 (two) times daily.     . vitamin E 400 UNIT capsule Take 400 Units by mouth every other day.     . vitamin k 100 MCG tablet Take 100 mcg by mouth daily.      Marland Kitchen HYDROmorphone (DILAUDID) 2 MG tablet Take 1 tablet (2 mg total) by mouth every 4 (four) hours as needed for severe pain. (Patient not taking: Reported on 06/24/2018) 30 tablet 0  . ondansetron (ZOFRAN) 8 MG tablet Take 1 tablet (8 mg total) by mouth 2 (two) times daily as needed. Start on the third day after chemotherapy. (Patient not taking: Reported on 06/24/2018) 30 tablet  1   No current facility-administered medications on file prior to visit.     Allergies:  Allergies  Allergen Reactions  . Oxycodone-Acetaminophen Anaphylaxis and Nausea And Vomiting  . Codeine Other (See Comments)    constipation   Past Medical History:  Past Medical History:  Diagnosis Date  . Anxiety and depression   . Constipation   . DJD (degenerative joint disease)   . Family history of adverse reaction to anesthesia    Mother - "shock" - due to anesthesia-   . Hypothyroidism   . Increased urinary frequency    Dr. Matilde Sprang  . LEG CRAMPS, NOCTURNAL 02/22/2007   Qualifier: Diagnosis of  By: Larose Kells MD, [facility]   . Spinal stenosis   . Stress incontinence    (saw Urology 5-09)    . Symptomatic cholelithiasis 2017   reluctant to have surgery  . Urge and stress incontinence    Dr. Matilde Sprang   Past Surgical History:  Past Surgical History:  Procedure Laterality Date  . APPLICATION OF CRANIAL NAVIGATION N/A 03/20/2018   Procedure: APPLICATION OF CRANIAL NAVIGATION;  Surgeon: Ashok Pall, MD;  Location: [facility];  Service: Neurosurgery;  Laterality: N/A;  . BACK SURGERY  [phone]  . COLONOSCOPY    . CRANIOTOMY Right 03/20/2018   Procedure: Right Temporal craniotomy for tumor resection with brainlab;  Surgeon: Ashok Pall, MD;  Location: [facility];  Service: Neurosurgery;  Laterality: Right;  Right Temporal craniotomy for tumor resection with brainlab  . DILATION AND CURETTAGE OF UTERUS    . EYE SURGERY Bilateral    cataract   Social History:  Social History   Socioeconomic History  . Marital status: Married    Spouse name: Not on file  . Number of children: 3  . Years of education: Not on file  . Highest education level: Not on file  Occupational History  . Occupation: retired Pensions consultant: RETIRED  Social Needs  . Financial resource strain: Not on file  . Food insecurity:    Worry: Not on file    Inability: Not on file  .  Transportation needs:    Medical: No    Non-medical: No  Tobacco Use  . Smoking status: Never Smoker  . Smokeless tobacco: Never Used  Substance and Sexual Activity  . Alcohol use: Yes    Comment: Rare  . Drug use: No  . Sexual activity: Not on file  Lifestyle  . Physical activity:    Days per week: Not on file    Minutes per session: Not on file  . Stress: Not on file  Relationships  . Social connections:    Talks on phone: Not on file    Gets together: Not on file    Attends religious service: Not on file  Active member of club or organization: Not on file    Attends meetings of clubs or organizations: Not on file    Relationship status: Not on file  . Intimate partner violence:    Fear of current or ex partner: No    Emotionally abused: No    Physically abused: No    Forced sexual activity: No  Other Topics Concern  . Not on file  Social History Narrative   Household- pt and husband   Daughter bipolar   Family History:  Family History  Problem Relation Age of Onset  . Heart failure Mother   . Heart attack Mother 65       dx in her 30s  . Colon cancer Neg Hx   . Breast cancer Neg Hx   . Diabetes Neg Hx     Review of Systems: Constitutional: Denies fevers, chills or abnormal weight loss Eyes: Denies blurriness of vision Ears, nose, mouth, throat, and face: Denies mucositis or sore throat Respiratory: Denies cough, dyspnea or wheezes Cardiovascular: Denies palpitation, chest discomfort or lower extremity swelling Gastrointestinal:  Denies nausea, constipation, diarrhea GU: Denies dysuria or incontinence Skin: Denies abnormal skin rashes Neurological: Per HPI Musculoskeletal: Denies joint pain, back or neck discomfort. No decrease in ROM Behavioral/Psych: Denies anxiety, disturbance in thought content, and mood instability  Physical Exam: Vitals:   06/24/18 1031  BP: 135/65  Pulse: 67  Resp: 17  Temp: (!) 97.4 F (36.3 C)  SpO2: 100%   ECOG  1 KPS: 90. General: Alert, cooperative, pleasant, in no acute distress Head: Craniotomy scar noted, dry and intact. EENT: No conjunctival injection or scleral icterus. Oral mucosa moist Lungs: Resp effort normal Cardiac: Regular rate and rhythm Abdomen: Soft, non-distended abdomen Skin: No rashes cyanosis or petechiae. Extremities: No clubbing or edema  Neurologic Exam: Mental Status: Awake, alert, attentive to examiner. Oriented to self and environment. Language is fluent with intact comprehension.  Cranial Nerves: Right eye modest impairment of acuity. Visual fields are full. Extra-ocular movements intact. R eye ptosis. Face is symmetric, tongue midline. Motor: Tone and bulk are normal. Power is full in both arms and legs. Reflexes are symmetric, no pathologic reflexes present. Intact finger to nose bilaterally Sensory: Intact to light touch and temperature Gait: Independent  Labs: I have reviewed the data as listed    Component Value Date/Time   NA 141 06/24/2018 1003   K 4.2 06/24/2018 1003   CL 101 06/24/2018 1003   CO2 29 06/24/2018 1003   GLUCOSE 94 06/24/2018 1003   BUN 8 06/24/2018 1003   CREATININE 0.78 06/24/2018 1003   CALCIUM 9.9 06/24/2018 1003   PROT 7.1 06/24/2018 1003   ALBUMIN 3.9 06/24/2018 1003   AST 10 (L) 06/24/2018 1003   ALT 8 06/24/2018 1003   ALKPHOS 44 06/24/2018 1003   BILITOT 0.3 06/24/2018 1003   GFRNONAA >60 06/24/2018 1003   GFRAA >60 06/24/2018 1003   Lab Results  Component Value Date   WBC 3.1 (L) 06/24/2018   NEUTROABS 2.1 06/24/2018   HGB 11.6 (L) 06/24/2018   HCT 36.2 06/24/2018   MCV 94.5 06/24/2018   PLT 138 (L) 06/24/2018   Imaging:  [facility] Clinician Interpretation: I have personally reviewed the CNS images as listed.  My interpretation, in the context of the patient's clinical presentation, is stable disease  Mr [name] Wo Contrast  Result Date: 06/21/2018 CLINICAL DATA:  Right temporal glioblastoma resected on 03/20/2018.  EXAM: MRI HEAD WITHOUT AND WITH CONTRAST  TECHNIQUE: Multiplanar, multiecho pulse sequences of the brain and surrounding structures were obtained without and with intravenous contrast. CONTRAST:  5 mL Gadavist COMPARISON:  03/21/2018 FINDINGS: Brain: A right temporal lobe resection cavity measures approximately 6.5 x 2.5 cm with chronic blood products noted, overall decreased in size from the prior immediate postoperative MRI. Regional vasogenic edema has greatly decreased, and mass effect on the right lateral ventricle and midline shift have resolved. There is progressive, now circumferential enhancement along the margins of the resection cavity, predominantly smooth and measuring up to 4-5 mm in thickness with superimposed small areas of mild nodularity most notable medially. A 2 cm wedge-shaped region of right temporal parenchymal enhancement posterosuperior to the resection cavity corresponds to the area of infarct on the prior MRI and also demonstrates some intrinsic T1 hyperintensity. There is predominantly smooth dural enhancement subjacent to the craniotomy extending throughout the anterior aspect of the right middle cranial fossa. A somewhat nodular focus of T2 hyperintensity in the right temporal lobe situated between the resection cavity and lateral ventricle measures 1.5 cm without enhancement (series 8, image 10). No acute infarct or significant extra-axial fluid collection is identified. There is moderate cerebral atrophy. T2 hyperintensities in the periventricular white matter bilaterally may reflect a combination of post treatment changes and mild chronic small vessel ischemic disease. Mild chronic small vessel changes are also present in the pons. Vascular: Major intracranial vascular flow voids are preserved. Skull and upper cervical spine: Right pterional craniotomy. Sinuses/Orbits: Bilateral cataract extraction. Clear paranasal sinuses. Large right and small left mastoid effusions. Other: None.  IMPRESSION: 1. Evolving postoperative changes from right temporal tumor resection with greatly diminished vasogenic edema and resolved midline shift. 2. Increased enhancement diffusely along the margins of the resection cavity, likely predominantly postoperative in nature though with mild nodularity medially as well as a small amount of nodular nonenhancing T2 hyperintensity which may reflect residual tumor. Attention on follow-up. 3. Enhancing subacute infarct posterior to the resection cavity. Electronically Signed   By: Logan Bores M.D.   On: 06/21/2018 12:42     Assessment/Plan Glioblastoma with isocitrate dehydrogenase gene wildtype ([facility]) - Plan: CBC with Differential (Cancer Center Only), CMP ([facility] only), temozolomide (TEMODAR) 20 MG capsule, temozolomide (TEMODAR) 100 MG capsule, ondansetron (ZOFRAN) 8 MG tablet   Marie Haynes is clinically stable now having completed radiation therapy.  Her MRI demonstrates changes we suspect are related to radiation treatment effect.  We discussed options for treatment pathways moving forward.  We recommended initiating treatment with Temozolomide 150 mg/m2, on for five days and off for twenty three days in twenty eight day cycles. The patient will have a complete blood count performed on day 28 of each cycle, and a comprehensive metabolic panel performed on day 28 of each cycle. Labs may need to be performed more often. Zofran will prescribed for home use for nausea/vomiting.   Chemotherapy should be held for the following:  ANC less than 1,000  Platelets less than 100,000  LFT or creatinine greater than 2x ULN  If clinical concerns/contraindications develop  Will also continue with Ramipril per [id number] study.  Also discussed risks and benefits of tumor-treating fields device (Optune).  She will contact us within 1 week with her decision.  We will continue to follow in 5 weeks with labs for evaluation.  We appreciate the opportunity to  participate in the care of [facility].   All questions were answered. The patient knows to call the clinic with any problems,  questions or concerns. No barriers to learning were detected.  The total time spent in the encounter was 25 minutes and more than 50% was on counseling and review of test results   Ventura Sellers, MD Medical Director of Neuro-Oncology [facility] 06/24/18 3:02 PM

## 2018-06-24 NOTE — Progress Notes (Signed)
[id number]: Ramipril: 1 Month Post RT  Patient, spouse and I met just prior to her appointment with Dr. Mickeal Skinner. Patient's daughter and grand-daughter here today, as well. Patient provided me with her study medication (for count) bottles # 1 of 4 (empty) and #2 of 4, (hasn't started yet) of Ramipril and her medication diary. Vitals assessed and patient's blood pressure today 135/65. Temodar is on hold till next week, per Dr. Mickeal Skinner, waiting on patient's insurance.  Ramipril: Patient confirms to have been taking 4 tabs before bedtime of Ramipril with no missed doses for this past month, 05/26/2018-06/23/2018, today's dose to be taken tonight at bedtime and to be continued for the next 84 days, pending any dose reducing side-effects. I returned to patient bottle #2 of 4 with 112 capsules and dispensed bottle #3 and #4 each containing 112 capsules, provided by PharmD Raul Del. Patient has a total of 336 capsules (among 3 bottles), four capsules to be taken at bedtime for a total of 5 mg for the next 84 days. The 2 new Ramipril bottles (#3 and #4) dispensed today are [id number], Rx# [id number], Exp 02/03/19.  Bottle #1 (Rx [id number]) was returned to PharmD Raul Del for documentation of return.  Medication Diaries: I reviewed patient documentation of self administration and patient reports to have taken 4 tablets, as was instructed, since 05/26/2018 to present and the return of empty medication bottle confirms compliance. Patient was also provided with new additional medication diaries for April and May of 2020, to continue with self administration documentation of Ramipril.  Concomitant Medications: Reviewed and patient reports no new medications, including no new OTC conmeds or supplements.  Neurocognitive's/PRO's: Patient completed at today's visit with research assistant, Remer Macho.  Labs: in clinic labs were collected today and all results reviewed by Dr.Vaslow, and per MD, patient ok to continue with 60m  Ramipril. Adverse Events: Patient denies new or worsening adverse events.  Plan: Patient continues to be WNL assessment and within parameters to continue with 4 capsules of Ramilpril, for a total of 514min the evening, reviewed with Dr. VaMickeal SkinnerPatient was informed to continue current dosage of 4 capsules (26m57mRamipril at bedtime, and to continue documenting self administration. I reviewed with patient and spouse, again, what to do should a dose be missed, what medications to avoid and and the expected adverse events as well as when to call Dr. VasMickeal Skinner myself. Patient was informed that her next study visit will be a phone call from me the week of February 17th and another phone call to follow in a months time in March. All patient's and spouse's questions answered to their satisfaction. Patient was thanked for her continued support of study and was encouraged to call with questions.   MirMaxwell MarionN, BSN, CCRWest Asc LLCinical Research 06/24/2018 1:47 PM   [place] Call[place]al [phone]/20/2020  Adverse Event Log  Study/Protocol: WF 1801(Ramipril) Cycle: Start of week 7 (End of Radiation Therapy) to 1 month Post RT Baseline- blurred vision (gr 1), general weakness (gr 1), Hypertension (gr 1)  Event Grade Onset Date Resolved Date Drug Name Attribution Treatment Comments  Nausea (intermittent)  1 04/15/18 ongoing Ramilpril unrelated OTC ginger capsules   Left hip pain 2 ~05/02/18 ongoing Ramipril unrelated conmed Musculoskeletal  Lower back pain 1 05/07/18 ongoing Ramipril  unrelated  conmed Musculoskeletal  Pruritus  1 ~04/2018 ongoing Temodar  related OTC conmed

## 2018-06-25 ENCOUNTER — Telehealth: Payer: Self-pay | Admitting: Pharmacist

## 2018-06-25 DIAGNOSIS — C719 Malignant neoplasm of brain, unspecified: Secondary | ICD-10-CM

## 2018-06-25 MED ORDER — TEMOZOLOMIDE 100 MG PO CAPS: ORAL_CAPSULE | ORAL | 0 refills | Status: DC

## 2018-06-25 MED ORDER — TEMOZOLOMIDE 20 MG PO CAPS: ORAL_CAPSULE | ORAL | 0 refills | Status: DC

## 2018-06-25 NOTE — Telephone Encounter (Signed)
Oral Chemotherapy Pharmacist Encounter   I spoke with patient's husband, Marie Haynes, for overview of: Temodar for the maintenance treatment of glioblastoma multiforme, planned duration of maintenance treatment for 6-12 cycles after completion of concomitant phase.  Counseled on administration, dosing, side effects, monitoring, drug-food interactions, safe handling, storage, and disposal.  Patient will take Temodar 100mg  capsules x 2 and Temodar 20mg  capsules x 1, 220 mg total daily dose, by mouth once daily, may take at bedtime and on an empty stomach to decrease nausea and vomiting.  Patient will will administered for 5 days out of every 28 days.  Mr. Kosch informed that dose of Temodar will likely increase for cycle 2 and subsequent cycles if cycle 1 is well-tolerated.  Original Temodar start date: 04/15/2018 Maintenance Temodar start date: 07/01/2018   Patient will take Zofran 8mg  tablet, 1 tablet by mouth 30-60 min prior to Temodar dose to help decrease N/V.   Adverse effects include but are not limited to: nausea, vomiting, anorexia, GI upset, rash, drug fever, and fatigue. Rare but serious adverse effects of pneumocystis pneumonia and secondary malignancy also discussed.  PCP prophylaxis will not be initiated at this time, but may be added based on lymphocyte count in the future.  Reviewed importance of keeping a medication schedule and plan for any missed doses.  Mr. Conkel voiced understanding and appreciation.   All questions answered. Medication reconciliation performed and medication/allergy list updated.  Mr. Hankins plans to pick up Temodar prescriptions from the [facility] outpatient pharmacy on 06/27/2018. Copayment $199.52, this will meet patient's deductible for the year, and likely will make any additional copayments for patient's Temodar $0 as they will then be fully covered by Medicare supplement.  Mr. Eilert knows to call the office with questions or concerns. Oral  Oncology Clinic will continue to follow.  Johny Drilling, PharmD, BCPS, BCOP  06/25/2018  1:45 PM Oral Oncology Clinic [phone]

## 2018-06-25 NOTE — Telephone Encounter (Signed)
Oral Oncology Pharmacist Encounter  Received new prescription for Temodar (temozolomide) for the maintenance treatment of glioblastoma multiforme, IDH-WT, planned duration 6-12 cycles.  Patient received concurrent chemoradiation with Temodar 06/15/17-05/27/18, which she tolerated fairly well  Temodar is planned to be administered at 150 mg/m2 PO for 5 days on, 23 days, for cycle 1 Temodar dose may be increased to 200 mg/m2 PO on dame dosing schedule if cycle 1 of maintenance phase is well tolerated  Labs from 06/24/2018 assessed, OK for treatment.  Current medication list in Epic reviewed, no DDIs with Temodar identified.  Prescription has been e-scribed to the [facility] for benefits analysis and approval.  Oral Oncology Clinic will continue to follow for insurance authorization, copayment issues, initial counseling and start date.  Johny Drilling, PharmD, BCPS, BCOP  06/25/2018 8:25 AM Oral Oncology Clinic [phone]

## 2018-06-26 ENCOUNTER — Telehealth: Payer: Self-pay | Admitting: *Deleted

## 2018-06-26 NOTE — Telephone Encounter (Signed)
Spoke with Marie Haynes and informed him re:  Per Dr. Mickeal Skinner,  Continue with Keppra until further notice from MD.  Marie Haynes voiced understanding.

## 2018-06-26 NOTE — Telephone Encounter (Signed)
Husband Rolan Bucco called stating pt has been on Keppra since surgery.  Pt is due to start new chemo drug next week.  Rolan Bucco wanted to know if pt should continue with Keppra and for how much longer. Rolan Bucco    Phone       [phone].

## 2018-06-26 NOTE — Telephone Encounter (Signed)
Spoke with Rolan Bucco

## 2018-06-27 MED FILL — TEMOZOLOMIDE 100 MG CAPS: 100 | 5 days supply | Qty: 10 | Fill #0

## 2018-06-27 MED FILL — TEMOZOLOMIDE 20 MG CAPSULE: 20 | 5 days supply | Qty: 5 | Fill #0

## 2018-07-01 ENCOUNTER — Encounter: Payer: Self-pay | Admitting: Radiation Oncology

## 2018-07-01 NOTE — Telephone Encounter (Signed)
Oral Oncology Patient Advocate Encounter  Confirmed with [place] that Temodar was picked up on 06/28/18 with a copay of $199.52 total.   [place] Patient [place] Phone [phone] Fax [phone]

## 2018-07-01 NOTE — Progress Notes (Signed)
error 

## 2018-07-02 ENCOUNTER — Inpatient Hospital Stay
Admission: RE | Admit: 2018-07-02 | Discharge: 2018-07-02 | Disposition: A | Discharge status: Home / Self Care | Payer: Self-pay | Source: Ambulatory Visit | Attending: Radiation Oncology | Admitting: Radiation Oncology

## 2018-07-02 ENCOUNTER — Emergency Department (HOSPITAL_COMMUNITY): Payer: Medicare Other

## 2018-07-02 ENCOUNTER — Other Ambulatory Visit: Payer: Self-pay

## 2018-07-02 ENCOUNTER — Telehealth: Payer: Self-pay | Admitting: *Deleted

## 2018-07-02 ENCOUNTER — Emergency Department (HOSPITAL_COMMUNITY)
Admission: EM | Admit: 2018-07-02 | Discharge: 2018-07-02 | Disposition: A | Discharge status: Home / Self Care | Payer: Medicare Other | Attending: Emergency Medicine | Admitting: Emergency Medicine

## 2018-07-02 DIAGNOSIS — R0789 Other chest pain: Secondary | ICD-10-CM | POA: Diagnosis not present

## 2018-07-02 DIAGNOSIS — R0602 Shortness of breath: Secondary | ICD-10-CM | POA: Diagnosis not present

## 2018-07-02 DIAGNOSIS — R079 Chest pain, unspecified: Secondary | ICD-10-CM

## 2018-07-02 DIAGNOSIS — E039 Hypothyroidism, unspecified: Secondary | ICD-10-CM | POA: Insufficient documentation

## 2018-07-02 DIAGNOSIS — Z85841 Personal history of malignant neoplasm of brain: Secondary | ICD-10-CM | POA: Insufficient documentation

## 2018-07-02 HISTORY — DX: Personal history of irradiation: Z92.3

## 2018-07-02 LAB — COMPREHENSIVE METABOLIC PANEL
ALT: 9 U/L (ref 0–44)
AST: 13 U/L — ABNORMAL LOW (ref 15–41)
Albumin: 3.3 g/dL — ABNORMAL LOW (ref 3.5–5.0)
Alkaline Phosphatase: 33 U/L — ABNORMAL LOW (ref 38–126)
Anion gap: 10 (ref 5–15)
BUN: 13 mg/dL (ref 8–23)
CHLORIDE: 99 mmol/L (ref 98–111)
CO2: 27 mmol/L (ref 22–32)
CREATININE: 0.79 mg/dL (ref 0.44–1.00)
Calcium: 9 mg/dL (ref 8.9–10.3)
GFR calc Af Amer: >60 mL/min (ref >60)
GFR calc non Af Amer: >60 mL/min (ref >60)
Glucose, Bld: 120 mg/dL — ABNORMAL HIGH (ref 70–99)
Potassium: 3.9 mmol/L (ref 3.5–5.1)
Sodium: 136 mmol/L (ref 135–145)
Total Bilirubin: 0.6 mg/dL (ref 0.3–1.2)
Total Protein: 6.6 g/dL (ref 6.5–8.1)

## 2018-07-02 LAB — CBC
HCT: 33.3 % — ABNORMAL LOW (ref 36.0–46.0)
Hemoglobin: 10.5 g/dL — ABNORMAL LOW (ref 12.0–15.0)
MCH: 29.7 pg (ref 26.0–34.0)
MCHC: 31.5 g/dL (ref 30.0–36.0)
MCV: 94.1 fL (ref 80.0–100.0)
Platelets: 124 10*3/uL — ABNORMAL LOW (ref 150–400)
RBC: 3.54 MIL/uL — AB (ref 3.87–5.11)
RDW: 14 % (ref 11.5–15.5)
WBC: 5 10*3/uL (ref 4.0–10.5)
nRBC: 0 % (ref 0.0–0.2)

## 2018-07-02 LAB — I-STAT TROPONIN, ED
Troponin i, poc: 0 ng/mL (ref 0.00–0.08)
Troponin i, poc: 0 ng/mL (ref 0.00–0.08)

## 2018-07-02 LAB — BRAIN NATRIURETIC PEPTIDE: B Natriuretic Peptide: 64.2 pg/mL (ref 0.0–100.0)

## 2018-07-02 MED ORDER — HYDROCODONE-ACETAMINOPHEN 5-325 MG PO TABS
1.0000 | ORAL_TABLET | Freq: Once | ORAL | Status: AC
  Administered 2018-07-02: 1 via ORAL
  Filled 2018-07-02: qty 1

## 2018-07-02 MED ORDER — HYDROCODONE-ACETAMINOPHEN 5-325 MG PO TABS: 1.0000 | ORAL_TABLET | ORAL | 0 refills | Status: DC | PRN

## 2018-07-02 MED ORDER — IOPAMIDOL (ISOVUE-370) INJECTION 76%
75.0000 mL | Freq: Once | INTRAVENOUS | Status: AC | PRN
  Administered 2018-07-02: 75 mL via INTRAVENOUS

## 2018-07-02 NOTE — Telephone Encounter (Signed)
"  Calling about my mother-n-law .Marie Haynes.  She's in the Emergency room.  Up this morning with chest pain, no energy to lift her head.  Left two messages earlier but need to know now, if we should give the medication.  She was on her second day of the higher dose."  This nurse advised ED provider will evaluate; contacting oncology with questions or needs.   Will notify Dr. Mickeal Skinner of family asking "Will a return call be received in light of messages left?".

## 2018-07-02 NOTE — Telephone Encounter (Signed)
The pt husband called said his wife in er.

## 2018-07-02 NOTE — Telephone Encounter (Signed)
Husband Rolan Bucco called reporting that pt started Temodar 220 mg  on Sunday, and Monday nights.  This am, pt developed chest pain, shoulder pain, and had difficulty getting out and back to bed.   Per Julius, pt can breathe normally, can talk without difficulty.  Pt is back in bed now but still feeling very uncomfortable.   Julius wanted to know what Dr. Vaslow would suggest. Julius   Phone     [phone].

## 2018-07-02 NOTE — ED Notes (Signed)
Patient verbalizes understanding of discharge instructions. Opportunity for questioning and answers were provided. Armband removed by staff, pt discharged from ED.  

## 2018-07-02 NOTE — ED Notes (Signed)
Patient transported to CT 

## 2018-07-02 NOTE — ED Triage Notes (Signed)
Pt BIB [facility]. Pt awoke at 0100 with chest pain that seemed to worsen when she took a deep breath. Pt took 324 of aspirin prior EMS arrival. Pt complains of 5/10 chest pain across her chest that worsens when she takes a deep breath. Pt denies any shortness of breath, nausea, or vomiting.

## 2018-07-02 NOTE — ED Notes (Signed)
ED Provider at bedside. 

## 2018-07-02 NOTE — ED Provider Notes (Signed)
[facility] Emergency Department Provider Note MRN:  [id number]  Arrival date & time: 07/02/18     Chief Complaint   Chest Pain   History of Present Illness   Marie Haynes is a 85 y.o. year-old female with a history of glioblastoma presenting to the ED with chief complaint of chest pain.  Patient was woken from sleep at about 1 AM with sharp central chest pain that seems to be made worse with every breath.  Moderate in severity, currently 5 out of 10.  Denies dizziness, diaphoresis, no nausea, no vomiting.  This is never happened to her before.  Denies leg pain or swelling, no abdominal pain, no recent fever or cough, no dysuria.  Review of Systems  A complete 10 system review of systems was obtained and all systems are negative except as noted in the HPI and PMH.   Patient's Health History    Past Medical History:  Diagnosis Date  . Anxiety and depression   . Constipation   . DJD (degenerative joint disease)   . Family history of adverse reaction to anesthesia    Mother - "shock" - due to anesthesia-   . History of radiation therapy 04/15/18- 05/27/18   Right frontal lobe tumor bed initial 46 Gy in 23 fractions, Right frontal lobe tumor bed boost 14 Gy in 7 fractions.   . Hypothyroidism   . Increased urinary frequency    Dr. Matilde Sprang  . LEG CRAMPS, NOCTURNAL 02/22/2007   Qualifier: Diagnosis of  By: Larose Kells MD, [place] [facility]   . Spinal stenosis   . Stress incontinence    (saw Urology 5-09)    . Symptomatic cholelithiasis 2017   reluctant to have surgery  . Urge and stress incontinence    Dr. Matilde Sprang    Past Surgical History:  Procedure Laterality Date  . APPLICATION OF CRANIAL NAVIGATION N/A 03/20/2018   Procedure: APPLICATION OF CRANIAL NAVIGATION;  Surgeon: Ashok Pall, MD;  Location: [place];  Service: Neurosurgery;  Laterality: N/A;  . BACK SURGERY  [phone]  . COLONOSCOPY    . CRANIOTOMY Right 03/20/2018   Procedure: Right Temporal  craniotomy for tumor resection with brainlab;  Surgeon: Ashok Pall, MD;  Location: [place];  Service: Neurosurgery;  Laterality: Right;  Right Temporal craniotomy for tumor resection with brainlab  . DILATION AND CURETTAGE OF UTERUS    . EYE SURGERY Bilateral    cataract    Family History  Problem Relation Age of Onset  . Heart failure Mother   . Heart attack Mother 29       dx in her 31s  . Colon cancer Neg Hx   . Breast cancer Neg Hx   . Diabetes Neg Hx     Social History   Socioeconomic History  . Marital status: Married    Spouse name: Not on file  . Number of children: 3  . Years of education: Not on file  . Highest education level: Not on file  Occupational History  . Occupation: retired Pensions consultant: RETIRED  Social Needs  . Financial resource strain: Not on file  . Food insecurity:    Worry: Not on file    Inability: Not on file  . Transportation needs:    Medical: No    Non-medical: No  Tobacco Use  . Smoking status: Never Smoker  . Smokeless tobacco: Never Used  Substance and Sexual Activity  . Alcohol use: Yes  Comment: Rare  . Drug use: No  . Sexual activity: Not on file  Lifestyle  . Physical activity:    Days per week: Not on file    Minutes per session: Not on file  . Stress: Not on file  Relationships  . Social connections:    Talks on phone: Not on file    Gets together: Not on file    Attends religious service: Not on file    Active member of club or organization: Not on file    Attends meetings of clubs or organizations: Not on file    Relationship status: Not on file  . Intimate partner violence:    Fear of current or ex partner: No    Emotionally abused: No    Physically abused: No    Forced sexual activity: No  Other Topics Concern  . Not on file  Social History Narrative   Household- pt and husband   Daughter bipolar     Physical Exam  Vital Signs and Nursing Notes reviewed Vitals:   07/02/18 1330 07/02/18  1400  BP: 114/60 (!) 107/53  Pulse: 81 83  Resp: 16 14  Temp:    SpO2: 98% 96%    CONSTITUTIONAL: Well-appearing, NAD NEURO:  Alert and oriented x 3, no focal deficits EYES:  eyes equal and reactive ENT/NECK:  no LAD, no JVD CARDIO: Regular rate, well-perfused, normal S1 and S2 PULM:  CTAB no wheezing or rhonchi GI/GU:  normal bowel sounds, non-distended, non-tender MSK/SPINE:  No gross deformities, no edema SKIN:  no rash, right side of scalp was shaven with large scar to the temporal scalp PSYCH:  Appropriate speech and behavior  Diagnostic and Interventional Summary    EKG Interpretation  Date/Time:  Tuesday July 02 2018 10:01:28 EST Ventricular Rate:  101 PR Interval:    QRS Duration: 135 QT Interval:  382 QTC Calculation: 496 R Axis:   -38 Text Interpretation:  Sinus tachycardia Left bundle branch block Confirmed by Gerlene Fee [id number]) on 07/02/2018 11:05:28 AM      Labs Reviewed  CBC - Abnormal; Notable for the following components:      Result Value   RBC 3.54 (*)    Hemoglobin 10.5 (*)    HCT 33.3 (*)    Platelets 124 (*)    All other components within normal limits  COMPREHENSIVE METABOLIC PANEL - Abnormal; Notable for the following components:   Glucose, Bld 120 (*)    Albumin 3.3 (*)    AST 13 (*)    Alkaline Phosphatase 33 (*)    All other components within normal limits  BRAIN NATRIURETIC PEPTIDE  I-STAT TROPONIN, ED  I-STAT TROPONIN, ED    CT ANGIO CHEST PE W OR WO CONTRAST  Final Result    DG Chest Port 1 View  Final Result      Medications  iopamidol (ISOVUE-370) 76 % injection 75 mL (75 mLs Intravenous Contrast Given 07/02/18 1335)     Procedures Critical Care  ED Course and Medical Decision Making  I have reviewed the triage vital signs and the nursing notes.  Pertinent labs & imaging results that were available during my care of the patient were reviewed by me and considered in my medical decision making (see below for  details).  Concern for pulmonary embolism in this 85 year old female with history of glioblastoma, currently on oral chemotherapy.  Also considering ACS.  Labs and CT pending.  Patient looking and feeling better.  Troponin negative x2, CT  unremarkable.  Unclear underlying cause of her pleuritic chest pain, possibly MSK in etiology.  Has good follow-up with her regular team of doctors.  After the discussed management above, the patient was determined to be safe for discharge.  The patient was in agreement with this plan and all questions regarding their care were answered.  ED return precautions were discussed and the patient will return to the ED with any significant worsening of condition.  Barth Kirks. Sedonia Small, [place] [email or web address]  Final Clinical Impressions(s) / ED Diagnoses     ICD-10-CM   1. Chest pain R07.9 DG Chest [facility] 1 View    DG Chest [facility]    ED Discharge Orders    None         Maudie Flakes, MD 07/02/18 [phone]

## 2018-07-02 NOTE — Discharge Instructions (Addendum)
You were evaluated in the Emergency Department and after careful evaluation, we did not find any emergent condition requiring admission or further testing in the hospital.  Your labs today did not reveal any heart damage.  Your CT today was also reassuring.  Please Tylenol at home every 4-6 hours to help with your pain and follow-up with your regular doctors.  Please return to the Emergency Department if you experience any worsening of your condition.  We encourage you to follow up with a primary care provider.  Thank you for allowing [place] to be a part of your care.

## 2018-07-02 NOTE — ED Notes (Signed)
Patient able to ambulate with stand by assist prior to discharge.

## 2018-07-03 ENCOUNTER — Telehealth: Payer: Self-pay | Admitting: Internal Medicine

## 2018-07-03 NOTE — Telephone Encounter (Signed)
Left message per 1/28 sch message - left message for patient with appt date and time

## 2018-07-04 ENCOUNTER — Telehealth: Payer: Self-pay | Admitting: Medical Oncology

## 2018-07-04 NOTE — Telephone Encounter (Signed)
Called husband with appt.for tomorrow.

## 2018-07-05 ENCOUNTER — Encounter: Payer: Self-pay | Admitting: Radiation Oncology

## 2018-07-05 ENCOUNTER — Ambulatory Visit
Admission: RE | Admit: 2018-07-05 | Discharge: 2018-07-05 | Disposition: A | Discharge status: Home / Self Care | Payer: Medicare Other | Source: Ambulatory Visit | Attending: Radiation Oncology | Admitting: Radiation Oncology

## 2018-07-05 ENCOUNTER — Other Ambulatory Visit: Payer: Self-pay

## 2018-07-05 ENCOUNTER — Inpatient Hospital Stay (HOSPITAL_BASED_OUTPATIENT_CLINIC_OR_DEPARTMENT_OTHER): Payer: Medicare Other | Admitting: Internal Medicine

## 2018-07-05 VITALS — BP 126/73 | HR 86 | Temp 98.0°F | Resp 18 | Wt 112.0 lb

## 2018-07-05 VITALS — BP 120/70 | HR 94 | Temp 97.7°F | Resp 16 | Ht 63.0 in | Wt 112.5 lb

## 2018-07-05 DIAGNOSIS — C719 Malignant neoplasm of brain, unspecified: Secondary | ICD-10-CM | POA: Diagnosis not present

## 2018-07-05 DIAGNOSIS — Z79899 Other long term (current) drug therapy: Secondary | ICD-10-CM | POA: Diagnosis not present

## 2018-07-05 DIAGNOSIS — C712 Malignant neoplasm of temporal lobe: Secondary | ICD-10-CM

## 2018-07-05 DIAGNOSIS — R9089 Other abnormal findings on diagnostic imaging of central nervous system: Secondary | ICD-10-CM | POA: Diagnosis not present

## 2018-07-05 DIAGNOSIS — Z923 Personal history of irradiation: Secondary | ICD-10-CM | POA: Diagnosis not present

## 2018-07-05 DIAGNOSIS — Z006 Encounter for examination for normal comparison and control in clinical research program: Secondary | ICD-10-CM | POA: Diagnosis not present

## 2018-07-05 NOTE — Progress Notes (Signed)
Pt here today for a follow-up for brain cancer. Pt denies having any headaches. Pt states moderate fatigue. Pt states that she is having blurred vision. Pt states that she is having some hearing loss in the right ear. Pt states that she has peeling and dryness around the treatment area. Pt states that she is using MD Therapy lotion for her skin. Pt denies having any nausea or vomiting. Pt states that she is occasionally off balanced. Pt states that she has not fallen. Pt denies having any fine motor changes. Pt denies having any speech changes. Pt states that on Tuesday morning she was taken to [facility] for shortness of breath and stiffness. Pt states that Dr. Mickeal Skinner has placed chemotherapy treatments on hold. Pt states that she is not currently on Decadron.   BP 126/73   Pulse 86   Temp 98 F (36.7 C) (Oral)   Resp 18   Wt 112 lb (50.8 kg)   SpO2 100%   BMI 19.84 kg/m    Wt Readings from Last 3 Encounters:  07/05/18 112 lb (50.8 kg)  07/05/18 112 lb 8 oz (51 kg)  07/02/18 113 lb 4.8 oz (51.4 kg)

## 2018-07-05 NOTE — Progress Notes (Signed)
Radiation Oncology         [phone] ________________________________  Name: Marie Haynes MRN: [id number]  Date: 07/05/2018  DOB: 1932-05-17  Follow-Up Visit Note  Outpatient  CC: Colon Branch, MD  Colon Branch, MD  Diagnosis and Prior Radiotherapy:    ICD-10-CM   1. Cancer of temporal lobe [facility]) C71.2     Right Temporal Glioblastoma, Grade IV  Radiation treatment dates:   04/15/18 - 05/27/18  Site/dose:    1) Right frontal lobe tumor bed initial / 46 Gy in 23 fractions 2) Right frontal lobe tumor bed boost / 14 Gy in 7 fractions  CHIEF COMPLAINT: Here for follow-up and surveillance of right temporal glioblastoma    Narrative:  The patient returns today for routine follow-up. She is accompanied by her husband and her son today. She is doing well overall. She was seen in the ED on 07/02/2018 for CP with a negative workup. She notes decreased energy levels (has been doing laps around the house to maintain energy). She was doing physical therapy, however, it was stopped awhile back, she would like to start with physical therapy again.   She underwent MR Brain on 06/21/2018 that showed: Evolving postoperative changes from right temporal tumor resection with greatly diminished vasogenic edema and resolved midline shift. Increased enhancement diffusely along the margins of the resection cavity, likely predominantly postoperative in nature though with mild nodularity medially as well as a small amount of nodular nonenhancing T2 hyperintensity which may reflect residual tumor. Attention on follow-up. Enhancing subacute infarct posterior to the resection cavity.                             She was last seen by Dr. Mickeal Skinner on today, 07/05/2018 and they previously discussed [place], however, the patient hasn't made a decision regarding it as of yet. Dr. Mickeal Skinner placed a hold on her Temozolomide today, 07/05/2018. She got through 2 days of Temozolomide, which they will follow up on 07/29/2018 with a  MRI prior to determine the next steps. Patient notes that she will continue on the Temozolomide once she is cleared to continue.   ALLERGIES:  is allergic to oxycodone-acetaminophen and codeine.  Meds: Current Outpatient Medications  Medication Sig Dispense Refill  . aspirin EC 81 MG tablet Take 81 mg by mouth daily.    . Calcium Carbonate-Vitamin D (CALCIUM + D PO) Take 3 tablets by mouth 2 (two) times daily.     Marland Kitchen docusate sodium (COLACE) 100 MG capsule Take 100 mg by mouth 2 (two) times daily as needed for mild constipation.    Marland Kitchen HYDROcodone-acetaminophen (NORCO/VICODIN) 5-325 MG tablet Take 1 tablet by mouth every 4 (four) hours as needed. 12 tablet 0  . HYDROmorphone (DILAUDID) 2 MG tablet Take 1 tablet (2 mg total) by mouth every 4 (four) hours as needed for severe pain. 30 tablet 0  . levETIRAcetam (KEPPRA) 250 MG tablet Take 1 tablet (250 mg total) by mouth 2 (two) times daily. 60 tablet 3  . levothyroxine (SYNTHROID, LEVOTHROID) 75 MCG tablet Take 1 tablet (75 mcg total) by mouth daily before breakfast. 30 tablet 1  . ondansetron (ZOFRAN) 8 MG tablet Take 1 tablet (8 mg total) by mouth 2 (two) times daily as needed (nausea and vomiting). Take 30-60 minutes prior to Temodar 30 tablet 1  . Investigational Ramipril 1.25 MG capsule [id number] Take 1 capsule (1.25 mg total) by mouth at bedtime for  7 days. Take at bedtime with or without food. Drink adequate water to avoid dehydration. Avoid salt substitutes that are high in potassium. (Patient taking differently: Take 2.5 mg by mouth at bedtime. Take at bedtime with or without food. Drink adequate water to avoid dehydration. Avoid salt substitutes that are high in potassium.) 133 capsule 0  . temozolomide (TEMODAR) 100 MG capsule Take 100mg  cap x2 + 20mg  cap (220mg  total dose) once daily for 5 days on, 23d off. Take on empty stomach & at bedtime to reduce N/V (Patient not taking: Reported on 07/05/2018) 10 capsule 0  . temozolomide (TEMODAR) 20 MG  capsule Take 20mg  cap + 100mg  cap x2 (220mg  total dose) once daily for 5 days on, 23d off. Take on empty stomach & at bedtime to reduce N/V (Patient not taking: Reported on 07/05/2018) 5 capsule 0   No current facility-administered medications for this encounter.     Physical Findings: The patient is in no acute distress. Patient is alert and oriented.  weight is 112 lb (50.8 kg). Her oral temperature is 98 F (36.7 C). Her blood pressure is 126/73 and her pulse is 86. Her respiration is 18 and oxygen saturation is 100%. .    General: Alert and oriented, in no acute distress HEENT: Head is normocephalic. Extraocular movements are intact. Oropharynx and oral cavity are clear. Neck: Neck is supple, no palpable cervical or supraclavicular lymphadenopathy.  Heart: Regular in rate and rhythm with no murmurs, rubs, or gallops. Chest: Clear to auscultation bilaterally, with no rhonchi, wheezes, or rales.  Extremities: No cyanosis or edema. Lymphatics: see Neck Exam Skin: No concerning lesions. Musculoskeletal: symmetric strength and muscle tone throughout. She does need help getting up on table.  Neurologic: Cranial nerves II through XII are grossly intact. No obvious focalities. Speech is fluent. Coordination is intact. Psychiatric: Judgment and insight are intact. Affect is appropriate.  Lab Findings: Lab Results  Component Value Date   WBC 5.0 07/02/2018   HGB 10.5 (L) 07/02/2018   HCT 33.3 (L) 07/02/2018   MCV 94.1 07/02/2018   PLT 124 (L) 07/02/2018    Radiographic Findings: Ct Angio Chest Pe W Or Wo Contrast  Result Date: 07/02/2018 CLINICAL DATA:  Shortness of breath and chest pain since this morning. Suspect pulmonary embolism. EXAM: CT ANGIOGRAPHY CHEST WITH CONTRAST TECHNIQUE: Multidetector CT imaging of the chest was performed using the standard protocol during bolus administration of intravenous contrast. Multiplanar CT image reconstructions and MIPs were obtained to evaluate  the vascular anatomy. CONTRAST:  22mL ISOVUE-370 IOPAMIDOL (ISOVUE-370) INJECTION 76% COMPARISON:  Chest radiograph July 02, 2018 FINDINGS: CARDIOVASCULAR: Adequate contrast opacification of the pulmonary artery's. Main pulmonary artery is not enlarged. No pulmonary arterial filling defects to the level of the subsegmental branches. Heart size is mildly enlarged with dilated RIGHT atrium seen with RIGHT heart failure. No pericardial effusion. Thoracic aorta is normal course and caliber, mild calcific atherosclerosis aortic arch. Moderate coronary artery calcifications. MEDIASTINUM/NODES: No lymphadenopathy by CT size criteria. LUNGS/PLEURA: Tracheobronchial tree is patent, no pneumothorax. Bibasilar atelectasis/scarring. No pleural effusion. Subpleural reticular densities. Biapical subpleural scarring. UPPER ABDOMEN: Non-acute. Subcentimeter hypodense probable cyst LEFT lobe of liver. Small hiatal hernia. MUSCULOSKELETAL: Non-acute. Review of the MIP images confirms the above findings. IMPRESSION: 1. No acute pulmonary embolism. 2. Mild subpleural reticulation which can be normal variant or fibrosis (non-UIP pattern). No focal consolidation. Aortic Atherosclerosis (ICD10-I70.0). Electronically Signed   By: Elon Alas M.D.   On: 07/02/2018 14:02   Mr Brain  W Wo Contrast  Result Date: 06/21/2018 CLINICAL DATA:  Right temporal glioblastoma resected on 03/20/2018. EXAM: MRI HEAD WITHOUT AND WITH CONTRAST TECHNIQUE: Multiplanar, multiecho pulse sequences of the brain and surrounding structures were obtained without and with intravenous contrast. CONTRAST:  5 mL Gadavist COMPARISON:  03/21/2018 FINDINGS: Brain: A right temporal lobe resection cavity measures approximately 6.5 x 2.5 cm with chronic blood products noted, overall decreased in size from the prior immediate postoperative MRI. Regional vasogenic edema has greatly decreased, and mass effect on the right lateral ventricle and midline shift have  resolved. There is progressive, now circumferential enhancement along the margins of the resection cavity, predominantly smooth and measuring up to 4-5 mm in thickness with superimposed small areas of mild nodularity most notable medially. A 2 cm wedge-shaped region of right temporal parenchymal enhancement posterosuperior to the resection cavity corresponds to the area of infarct on the prior MRI and also demonstrates some intrinsic T1 hyperintensity. There is predominantly smooth dural enhancement subjacent to the craniotomy extending throughout the anterior aspect of the right middle cranial fossa. A somewhat nodular focus of T2 hyperintensity in the right temporal lobe situated between the resection cavity and lateral ventricle measures 1.5 cm without enhancement (series 8, image 10). No acute infarct or significant extra-axial fluid collection is identified. There is moderate cerebral atrophy. T2 hyperintensities in the periventricular white matter bilaterally may reflect a combination of post treatment changes and mild chronic small vessel ischemic disease. Mild chronic small vessel changes are also present in the pons. Vascular: Major intracranial vascular flow voids are preserved. Skull and upper cervical spine: Right pterional craniotomy. Sinuses/Orbits: Bilateral cataract extraction. Clear paranasal sinuses. Large right and small left mastoid effusions. Other: None. IMPRESSION: 1. Evolving postoperative changes from right temporal tumor resection with greatly diminished vasogenic edema and resolved midline shift. 2. Increased enhancement diffusely along the margins of the resection cavity, likely predominantly postoperative in nature though with mild nodularity medially as well as a small amount of nodular nonenhancing T2 hyperintensity which may reflect residual tumor. Attention on follow-up. 3. Enhancing subacute infarct posterior to the resection cavity. Electronically Signed   By: Logan Bores M.D.    On: 06/21/2018 12:42   Dg Chest Port 1 View  Result Date: 07/02/2018 CLINICAL DATA:  Chest pain. EXAM: PORTABLE CHEST 1 VIEW COMPARISON:  None available currently. FINDINGS: The heart size and mediastinal contours are within normal limits. Atherosclerosis of thoracic aorta is noted. No pneumothorax or pleural effusion is noted. Both lungs are clear. The visualized skeletal structures are unremarkable. IMPRESSION: No acute cardiopulmonary abnormality seen. Aortic Atherosclerosis (ICD10-I70.0). Electronically Signed   By: Marijo Conception, M.D.   On: 07/02/2018 10:35    Impression/Plan: healing well from RT  Discussed with the patient and her family regarding [place] and advised that the patient follow up with Dr. Mickeal Skinner for further discussion regarding [place].   Continue follow up with Neuro-Oncologist, Dr. Mickeal Skinner as scheduled. Follow up PRN in Radiation Oncology. Will refer the patient to the [facility] for strength and stamina rehabiltation.    I spent 20 minutes  face to face with the patient and more than 50% of that time was spent in counseling and/or coordination of care.  _____________________________________   Eppie Gibson, MD  This document serves as a record of services personally performed by Eppie Gibson, MD. It was created on her behalf by Steva Colder, a trained medical scribe. The creation of this record is based on the scribe's personal  observations and the provider's statements to them. This document has been checked and approved by the attending provider.

## 2018-07-05 NOTE — Progress Notes (Signed)
[facility] [place] [employer], [place] [address] [phone]   Interval Evaluation  Date of Service: 07/05/18 Patient Name: Marie Haynes Patient MRN: [id number] Patient DOB: [birth year] Provider: Ventura Sellers, MD  Identifying Statement:  Marie Haynes is a 85 y.o. 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 Christella Noa     - 05/28/2018 Radiation Therapy    Completes IMRT with concurrent Temodar     Biomarkers:  MGMT Unknown.  IDH 1/2 Wild type.  EGFR Unknown  TERT Unknown   Interval History:  Marie Haynes presents for follow up today after recent ED visit.  She describes sudden onset of chest pain "all over" 5 nights ago, with pain upon deep inspiration.  She denies any LOC, seizure activity, or cognitive component.  ED workup included CT angio and cardiac evaluation, which were negative.  Pain resolved with administration of analgesia and she is currently at baseline.  Event occurred after 2nd dose of Temodar during 5-day cycle, she has not had resumed therapy this month.  H+P (04/05/18) Patient presented to medical attention several weeks ago with new onset severe headache, which was a new complaint for her.  CT demonstrated a brain mass, and follow up MRI confirmed likely neoplasm.  After review by neurosurgery, she underwent craniotomy and resection of the mass with Dr. Christella Noa on 03/20/18.  She tolerated surgery well without any complaints or post-op deficits.  Since surgery she has been at home recovering, no recurrence of headaches.  One day last week she woke up "very confused" and unable to use left hand/arm in particular.  This gradually improved during the day until she was back at baseline by the evening; this "spell" has not recurred since that time.  She is currently off Keppra and  did feel "drowsy" while on 522m twice per day.  She presents today to review pathology, prognosis, treatment options moving forward.  Medications: Current Outpatient Medications on File Prior to Visit  Medication Sig Dispense Refill  . aspirin EC 81 MG tablet Take 81 mg by mouth daily.    . Calcium Carbonate-Vitamin D (CALCIUM + D PO) Take 3 tablets by mouth 2 (two) times daily.     .Marland Kitchendocusate sodium (COLACE) 100 MG capsule Take 100 mg by mouth 2 (two) times daily as needed for mild constipation.    . levETIRAcetam (KEPPRA) 250 MG tablet Take 1 tablet (250 mg total) by mouth 2 (two) times daily. 60 tablet 3  . levothyroxine (SYNTHROID, LEVOTHROID) 75 MCG tablet Take 1 tablet (75 mcg total) by mouth daily before breakfast. 30 tablet 1  . HYDROcodone-acetaminophen (NORCO/VICODIN) 5-325 MG tablet Take 1 tablet by mouth every 4 (four) hours as needed. (Patient not taking: Reported on 07/05/2018) 12 tablet 0  . HYDROmorphone (DILAUDID) 2 MG tablet Take 1 tablet (2 mg total) by mouth every 4 (four) hours as needed for severe pain. (Patient not taking: Reported on 06/24/2018) 30 tablet 0  . Investigational Ramipril 1.25 MG capsule [id number] Take 1 capsule (1.25 mg total) by mouth at bedtime for 7 days. Take at bedtime with or without food. Drink adequate water to avoid dehydration. Avoid salt substitutes that are high in potassium. (Patient taking differently: Take 2.5 mg by mouth at bedtime. Take at bedtime with or without food. Drink adequate water to avoid  dehydration. Avoid salt substitutes that are high in potassium.) 133 capsule 0  . ondansetron (ZOFRAN) 8 MG tablet Take 1 tablet (8 mg total) by mouth 2 (two) times daily as needed (nausea and vomiting). Take 30-60 minutes prior to Temodar (Patient not taking: Reported on 07/02/2018) 30 tablet 1  . temozolomide (TEMODAR) 100 MG capsule Take 116m cap x2 + 258mcap (22084motal dose) once daily for 5 days on, 23d off. Take on empty stomach & at bedtime to  reduce N/V (Patient not taking: Reported on 07/05/2018) 10 capsule 0  . temozolomide (TEMODAR) 20 MG capsule Take 51m38mp + 100mg25m x2 (251mg 68ml dose) once daily for 5 days on, 23d off. Take on empty stomach & at bedtime to reduce N/V (Patient not taking: Reported on 07/05/2018) 5 capsule 0   No current facility-administered medications on file prior to visit.     Allergies:  Allergies  Allergen Reactions  . Oxycodone-Acetaminophen Anaphylaxis and Nausea And Vomiting  . Codeine Other (See Comments)    constipation   Past Medical History:  Past Medical History:  Diagnosis Date  . Anxiety and depression   . Constipation   . DJD (degenerative joint disease)   . Family history of adverse reaction to anesthesia    Mother - "shock" - due to anesthesia-   . History of radiation therapy 04/15/18- 05/27/18   Right frontal lobe tumor bed initial 46 Gy in 23 fractions, Right frontal lobe tumor bed boost 14 Gy in 7 fractions.   . Hypothyroidism   . Increased urinary frequency    Dr. MacDiaMatilde SprangG CRAMPS, NOCTURNAL 02/22/2007   Qualifier: Diagnosis of  By: Paz MDLarose Kellsose EAmboyporosis   . Spinal stenosis   . Stress incontinence    (saw Urology 5-09)    . Symptomatic cholelithiasis 2017   reluctant to have surgery  . Urge and stress incontinence    Dr. MacDiaMatilde Sprangt Surgical History:  Past Surgical History:  Procedure Laterality Date  . APPLICATION OF CRANIAL NAVIGATION N/A 03/20/2018   Procedure: APPLICATION OF CRANIAL NAVIGATION;  Surgeon: CabbelAshok Pall Location: [facility] OR;[place]: Neurosurgery;  Laterality: N/A;  . BACK SURGERY  [id number]    . CRANIOTOMY Right 03/20/2018   Procedure: Right Temporal craniotomy for tumor resection with brainlab;  Surgeon: CabbelAshok Pall Location: [facility] OR;[place]elle[place]ice: Neurosurgery;  Laterality: Right;  Right Temporal craniotomy for tumor resection with brainlab  . DILATION AND CURETTAGE OF UTERUS    . EYE SURGERY  Bilateral    cataract   Social History:  Social History   Socioeconomic History  . Marital status: Married    Spouse name: Not on file  . Number of children: 3  . Years of education: Not on file  . Highest education level: Not on file  Occupational History  . Occupation: retired [employer]  Social Needs  . Financial resource strain: Not on file  . Food insecurity:    Worry: Not on file    Inability: Not on file  . Transportation needs:    Medical: No    Non-medical: No  Tobacco Use  . Smoking status: Never Smoker  . Smokeless tobacco: Never Used  Substance and Sexual Activity  . Alcohol use: Yes    Comment: Rare  . Drug use: No  . Sexual activity: Not on file  Lifestyle  . Physical  activity:    Days per week: Not on file    Minutes per session: Not on file  . Stress: Not on file  Relationships  . Social connections:    Talks on phone: Not on file    Gets together: Not on file    Attends religious service: Not on file    Active member of club or organization: Not on file    Attends meetings of clubs or organizations: Not on file    Relationship status: Not on file  . Intimate partner violence:    Fear of current or ex partner: No    Emotionally abused: No    Physically abused: No    Forced sexual activity: No  Other Topics Concern  . Not on file  Social History Narrative   Household- pt and husband   Daughter bipolar   Family History:  Family History  Problem Relation Age of Onset  . Heart failure Mother   . Heart attack Mother 73       dx in her 69s  . Colon cancer Neg Hx   . Breast cancer Neg Hx   . Diabetes Neg Hx     Review of Systems: Constitutional: Denies fevers, chills or abnormal weight loss Eyes: Denies blurriness of vision Ears, nose, mouth, throat, and face: Denies mucositis or sore throat Respiratory: Denies cough, dyspnea or wheezes Cardiovascular: Denies palpitation, chest discomfort or lower extremity  swelling Gastrointestinal:  Denies nausea, constipation, diarrhea GU: Denies dysuria or incontinence Skin: Denies abnormal skin rashes Neurological: Per HPI Musculoskeletal: Denies joint pain, back or neck discomfort. No decrease in ROM Behavioral/Psych: Denies anxiety, disturbance in thought content, and mood instability  Physical Exam: Vitals:   07/05/18 1211  BP: 120/70  Pulse: 94  Resp: 16  Temp: 97.7 F (36.5 C)  SpO2: 99%   ECOG 1 KPS: 90. General: Alert, cooperative, pleasant, in no acute distress Head: Craniotomy scar noted, dry and intact. EENT: No conjunctival injection or scleral icterus. Oral mucosa moist Lungs: Resp effort normal Cardiac: Regular rate and rhythm Abdomen: Soft, non-distended abdomen Skin: No rashes cyanosis or petechiae. Extremities: No clubbing or edema  Neurologic Exam: Mental Status: Awake, alert, attentive to examiner. Oriented to self and environment. Language is fluent with intact comprehension.  Cranial Nerves: Right eye modest impairment of acuity. Visual fields are full. Extra-ocular movements intact. R eye ptosis. Face is symmetric, tongue midline. Motor: Tone and bulk are normal. Power is full in both arms and legs. Reflexes are symmetric, no pathologic reflexes present. Intact finger to nose bilaterally Sensory: Intact to light touch and temperature Gait: Independent  Labs: I have reviewed the data as listed    Component Value Date/Time   NA 136 07/02/2018 1015   K 3.9 07/02/2018 1015   CL 99 07/02/2018 1015   CO2 27 07/02/2018 1015   GLUCOSE 120 (H) 07/02/2018 1015   BUN 13 07/02/2018 1015   CREATININE 0.79 07/02/2018 1015   CREATININE 0.78 06/24/2018 1003   CALCIUM 9.0 07/02/2018 1015   PROT 6.6 07/02/2018 1015   ALBUMIN 3.3 (L) 07/02/2018 1015   AST 13 (L) 07/02/2018 1015   AST 10 (L) 06/24/2018 1003   ALT 9 07/02/2018 1015   ALT 8 06/24/2018 1003   ALKPHOS 33 (L) 07/02/2018 1015   BILITOT 0.6 07/02/2018 1015    BILITOT 0.3 06/24/2018 1003   GFRNONAA >60 07/02/2018 1015   GFRNONAA >60 06/24/2018 1003   GFRAA >60 07/02/2018 1015   GFRAA >60  06/24/2018 1003   Lab Results  Component Value Date   WBC 5.0 07/02/2018   NEUTROABS 2.1 06/24/2018   HGB 10.5 (L) 07/02/2018   HCT 33.3 (L) 07/02/2018   MCV 94.1 07/02/2018   PLT 124 (L) 07/02/2018   Imaging:  [facility] Clinician Interpretation: I have personally reviewed the CNS images as listed.  My interpretation, in the context of the patient's clinical presentation, is stable disease  Ct Angio Chest Pe W Or Wo Contrast  Result Date: 07/02/2018 CLINICAL DATA:  Shortness of breath and chest pain since this morning. Suspect pulmonary embolism. EXAM: CT ANGIOGRAPHY CHEST WITH CONTRAST TECHNIQUE: Multidetector CT imaging of the chest was performed using the standard protocol during bolus administration of intravenous contrast. Multiplanar CT image reconstructions and MIPs were obtained to evaluate the vascular anatomy. CONTRAST:  59m ISOVUE-370 IOPAMIDOL (ISOVUE-370) INJECTION 76% COMPARISON:  Chest radiograph July 02, 2018 FINDINGS: CARDIOVASCULAR: Adequate contrast opacification of the pulmonary artery's. Main pulmonary artery is not enlarged. No pulmonary arterial filling defects to the level of the subsegmental branches. Heart size is mildly enlarged with dilated RIGHT atrium seen with RIGHT heart failure. No pericardial effusion. Thoracic aorta is normal course and caliber, mild calcific atherosclerosis aortic arch. Moderate coronary artery calcifications. MEDIASTINUM/NODES: No lymphadenopathy by CT size criteria. LUNGS/PLEURA: Tracheobronchial tree is patent, no pneumothorax. Bibasilar atelectasis/scarring. No pleural effusion. Subpleural reticular densities. Biapical subpleural scarring. UPPER ABDOMEN: Non-acute. Subcentimeter hypodense probable cyst LEFT lobe of liver. Small hiatal hernia. MUSCULOSKELETAL: Non-acute. Review of the MIP images confirms the above  findings. IMPRESSION: 1. No acute pulmonary embolism. 2. Mild subpleural reticulation which can be normal variant or fibrosis (non-UIP pattern). No focal consolidation. Aortic Atherosclerosis (ICD10-I70.0). Electronically Signed   By: CElon AlasM.D.   On: 07/02/2018 14:02   Mr BJeri CosWYSContrast  Result Date: 06/21/2018 CLINICAL DATA:  Right temporal glioblastoma resected on 03/20/2018. EXAM: MRI HEAD WITHOUT AND WITH CONTRAST TECHNIQUE: Multiplanar, multiecho pulse sequences of the brain and surrounding structures were obtained without and with intravenous contrast. CONTRAST:  5 mL Gadavist COMPARISON:  03/21/2018 FINDINGS: Brain: A right temporal lobe resection cavity measures approximately 6.5 x 2.5 cm with chronic blood products noted, overall decreased in size from the prior immediate postoperative MRI. Regional vasogenic edema has greatly decreased, and mass effect on the right lateral ventricle and midline shift have resolved. There is progressive, now circumferential enhancement along the margins of the resection cavity, predominantly smooth and measuring up to 4-5 mm in thickness with superimposed small areas of mild nodularity most notable medially. A 2 cm wedge-shaped region of right temporal parenchymal enhancement posterosuperior to the resection cavity corresponds to the area of infarct on the prior MRI and also demonstrates some intrinsic T1 hyperintensity. There is predominantly smooth dural enhancement subjacent to the craniotomy extending throughout the anterior aspect of the right middle cranial fossa. A somewhat nodular focus of T2 hyperintensity in the right temporal lobe situated between the resection cavity and lateral ventricle measures 1.5 cm without enhancement (series 8, image 10). No acute infarct or significant extra-axial fluid collection is identified. There is moderate cerebral atrophy. T2 hyperintensities in the periventricular white matter bilaterally may reflect a  combination of post treatment changes and mild chronic small vessel ischemic disease. Mild chronic small vessel changes are also present in the pons. Vascular: Major intracranial vascular flow voids are preserved. Skull and upper cervical spine: Right pterional craniotomy. Sinuses/Orbits: Bilateral cataract extraction. Clear paranasal sinuses. Large right and small left mastoid effusions.  Other: None. IMPRESSION: 1. Evolving postoperative changes from right temporal tumor resection with greatly diminished vasogenic edema and resolved midline shift. 2. Increased enhancement diffusely along the margins of the resection cavity, likely predominantly postoperative in nature though with mild nodularity medially as well as a small amount of nodular nonenhancing T2 hyperintensity which may reflect residual tumor. Attention on follow-up. 3. Enhancing subacute infarct posterior to the resection cavity. Electronically Signed   By: Logan Bores M.D.   On: 06/21/2018 12:42   Dg Chest Port 1 View  Result Date: 07/02/2018 CLINICAL DATA:  Chest pain. EXAM: PORTABLE CHEST 1 VIEW COMPARISON:  None available currently. FINDINGS: The heart size and mediastinal contours are within normal limits. Atherosclerosis of thoracic aorta is noted. No pneumothorax or pleural effusion is noted. Both lungs are clear. The visualized skeletal structures are unremarkable. IMPRESSION: No acute cardiopulmonary abnormality seen. Aortic Atherosclerosis (ICD10-I70.0). Electronically Signed   By: Marijo Conception, M.D.   On: 07/02/2018 10:35     Assessment/Plan Glioblastoma with isocitrate dehydrogenase gene wildtype ([place])   Marie Haynes is clinically stable and without symptoms following her chest pain event.  Fortunately no PE was visualized on CT angio.  Serial CTNIs were 0.00 as well.  Etiology may have been musculoskeletal such as costo-chondritis.    We recommended not resuming her chemo this month. We will revisit further treatment plan  adjustments after the next appointment.   Will also continue with Ramipril per [id number] study.  We will continue to follow in 3 weeks as scheduled  We appreciate the opportunity to participate in the care of [facility].   All questions were answered. The patient knows to call the clinic with any problems, questions or concerns. No barriers to learning were detected.  The total time spent in the encounter was 25 minutes and more than 50% was on counseling and review of test results   Ventura Sellers, MD Medical Director of Neuro-Oncology [facility] [place] 07/05/18 12:17 PM

## 2018-07-08 ENCOUNTER — Telehealth: Payer: Self-pay

## 2018-07-08 NOTE — Telephone Encounter (Signed)
Per 1/31 no los

## 2018-07-09 ENCOUNTER — Telehealth: Payer: Self-pay

## 2018-07-09 ENCOUNTER — Other Ambulatory Visit: Payer: Self-pay | Admitting: Radiation Oncology

## 2018-07-09 ENCOUNTER — Encounter: Payer: Self-pay | Admitting: Radiation Oncology

## 2018-07-09 DIAGNOSIS — C712 Malignant neoplasm of temporal lobe: Secondary | ICD-10-CM

## 2018-07-09 NOTE — Telephone Encounter (Signed)
Nutrition   ASSESSMENT:  Patient identified on Malnutrition screening report for weight loss and poor appetite.    85 year old female with right temporal gliblastoma. Patient s/p craniotomy resection on 03/20/18 and s/p completion of radiation. Followed by Dr. Mickeal Skinner and Dr Isidore Moos.    Called and spoke with patient this am and introduced self and service at the cancer center.  Patient reports that she is not concerned with her weight loss.  Reports that her appetite is poor but she still eats 3 meals per day.  Her son has gotten her some boost and she tried one yesterday between breakfast and lunch.    Medications: reviewed  Labs: reviewed  Anthropometrics:   Height: 63 inches Weight: 112 lb Noted weight of 119lb 4.8 oz on 04/25/18. Weight previously in the 120s BMI: 19  6% weight los sin the last 2 months   NUTRITION DIAGNOSIS: Unintentional weight loss related to cancer and cancer related treatment side effects as evidenced by 6% weight loss in the last 2 months and report of poor appetite   INTERVENTION:  Discussed briefly importance of nutrition and weight maintenance with patient via phone. Encouraged oral nutrition supplement of at least 350 calories or more daily to provide additional calories and protein.   Offered nutrition services at [facility] and patient declined at this time.  Contact information provided to patient and patient will reach out as needed    MONITORING, EVALUATION, GOAL: Patient will consume adequate calories and protein to prevent further weight loss   NEXT VISIT: patient to reach out as needed  [place] Nishi B. Zenia Resides, [employer], [place] Registered Dietitian [phone] (pager)

## 2018-07-10 ENCOUNTER — Telehealth: Payer: Self-pay | Admitting: *Deleted

## 2018-07-10 NOTE — Telephone Encounter (Signed)
CALLED [facility] TO ARRANGE THIS APPT. FOR THIS PATIENT, SPOKE WITH ROSE AND SHE TELLS ME THAT SHE HAS CALLED HER AND THAT SHE HASN'T RETURNED THEIR CALL AS OF 07-10-18

## 2018-07-11 ENCOUNTER — Ambulatory Visit: Payer: Medicare Other | Attending: Radiation Oncology | Admitting: Physical Therapy

## 2018-07-11 DIAGNOSIS — R262 Difficulty in walking, not elsewhere classified: Secondary | ICD-10-CM | POA: Insufficient documentation

## 2018-07-11 DIAGNOSIS — M6281 Muscle weakness (generalized): Secondary | ICD-10-CM | POA: Insufficient documentation

## 2018-07-11 DIAGNOSIS — R293 Abnormal posture: Secondary | ICD-10-CM | POA: Insufficient documentation

## 2018-07-11 DIAGNOSIS — Z483 Aftercare following surgery for neoplasm: Secondary | ICD-10-CM | POA: Diagnosis not present

## 2018-07-11 NOTE — Therapy (Addendum)
[place], [place], [address] Phone: [phone]   Fax:  [phone]  Physical Therapy Treatment  Patient Details  Name: Marie Haynes MRN: [id number] Date of Birth: [birth year] Referring Provider (PT): Dr. Isidore Moos   Encounter Date: 07/11/2018    Past Medical History:  Diagnosis Date  . Anxiety and depression   . Constipation   . DJD (degenerative joint disease)   . Family history of adverse reaction to anesthesia    Mother - "shock" - due to anesthesia-   . History of radiation therapy 04/15/18- 05/27/18   Right frontal lobe tumor bed initial 46 Gy in 23 fractions, Right frontal lobe tumor bed boost 14 Gy in 7 fractions.   . Hypothyroidism   . Increased urinary frequency    Dr. Matilde Sprang  . LEG CRAMPS, NOCTURNAL 02/22/2007   Qualifier: Diagnosis of  By: Larose Kells MD, [place] Osteoporosis   . Spinal stenosis   . Stress incontinence    (saw Urology 5-09)    . Symptomatic cholelithiasis 2017   reluctant to have surgery  . Urge and stress incontinence    Dr. Matilde Sprang    Past Surgical History:  Procedure Laterality Date  . APPLICATION OF CRANIAL NAVIGATION N/A 03/20/2018   Procedure: APPLICATION OF CRANIAL NAVIGATION;  Surgeon: Ashok Pall, MD;  Location: [facility];  Service: Neurosurgery;  Laterality: N/A;  . BACK SURGERY  [phone]  . COLONOSCOPY    . CRANIOTOMY Right 03/20/2018   Procedure: Right Temporal craniotomy for tumor resection with brainlab;  Surgeon: Ashok Pall, MD;  Location: [facility];  Service: Neurosurgery;  Laterality: Right;  Right Temporal craniotomy for tumor resection with brainlab  . DILATION AND CURETTAGE OF UTERUS    . EYE SURGERY Bilateral    cataract    There were no vitals filed for this visit.  Subjective Assessment - 07/11/18 1520    Subjective  Pt states she is just so fatigued, her vision is still blurred.  She doesn't have the back pain she did have, but it still  takes her a long time to get into bed and back out. She wants to be able to walk and move better     Pertinent History  pt with right temporal glioblasatoma with craniotomy  03/20/2018.  Radiation and chemo from 04/15/2018 to 05/27/2018 Pt had an admission to ER last week for chest pain that was inconclusion  Past history includes osteoporosis and spinal stenosis     Patient Stated Goals  to get stronger     Currently in Pain?  No/denies   pt observed wincing during bed mobility         [facility] PT Assessment - 07/11/18 0001      Assessment   Medical Diagnosis  right temporal glioblastoma s/p craniotomy    Referring Provider (PT)  Dr. Isidore Moos    Onset Date/Surgical Date  03/20/18      Precautions   Precautions  Fall      Restrictions   Weight Bearing Restrictions  No      Balance Screen   Has the patient fallen in the past 6 months  No    Has the patient had a decrease in activity level because of a fear of falling?   Yes   pt doesn't get up if she is by herself    Is the patient reluctant to leave their home because of a fear of falling?   Yes  Home Environment   Living Environment  Private residence    Living Arrangements  Spouse/significant other    Available Help at Discharge  Family   currently staying with their oldest son and daughter in law    Type of [place] to enter    [place]  One level    [place] - 2 wheels;Shower seat   pt is not using her walker, too much trouble to take a showe     Prior Function   Level of Independence  Needs assistance with ADLs   assist with showering    Vocation  Retired    Leisure  trying to walk in the house as much as possible       Charity fundraiser Status  Within Functional Limits for tasks assessed      Observation/Other Assessments   Observations  pt comes in with her husband walking with no assitive device  Pt is thin and appears  frail.  she appears to move slowly and husband helps steady her. She says she doesn't feel she needs to use her walker any more      Sensation   Light Touch  Appears Intact      Coordination   Gross Motor Movements are Fluid and Coordinated  Yes      Functional Tests   Functional tests  Sit to Stand      Sit to Stand   Comments  4 in 30 sec  Pt needed to  push up with arms    pt appears fatigued at end     Posture/Postural Control   Posture/Postural Control  Postural limitations    Postural Limitations  Rounded Shoulders;Forward head;Increased thoracic kyphosis      AROM   Overall AROM   Within functional limits for tasks performed      Strength   Overall Strength  Deficits    Overall Strength Comments  Pt appears very weak with muscle atophy.  She moves very slowly and needs frequent assist for safery.     Right Hand Grip (lbs)  --    Left Hand Grip (lbs)  --      Bed Mobility   Bed Mobility  Rolling Left;Supine to Sit;Sit to Supine    Rolling Right  Minimal Assistance - Patient > 75%    Right Sidelying to Sit  Minimal Assistance - Patient > 75%    Supine to Sit  Minimal Assistance - Patient > 75%    Sit to Supine  Minimal Assistance - Patient > 75%      Transfers   Transfers  Sit to Stand;Stand to Sit;Supine to Sit;Sit to Supine    Sit to Stand  7: Independent;6: Modified independent (Device/Increase time)    Stand to Sit  --    Supine to Sit  --    Sit to Supine  --      Ambulation/Gait   Ambulation/Gait  Yes    Ambulation/Gait Assistance  6: Modified independent (Device/Increase time)   hand hold assist, very slow and "tottery"    Ambulation Distance (Feet)  50 Feet    Assistive device  1 person hand held assist    Gait velocity  --    Stairs  Yes    Stairs Assistance  4: Min assist    Gait Comments  recommended that pt return to using her rolling  walker       6 Minute Walk- Baseline   6 Minute Walk- Baseline  --    HR (bpm)  --    02 Sat (%RA)  --     Modified Borg Scale for Dyspnea  --      6 Minute walk- Post Test   6 Minute Walk Post Test  --    HR (bpm)  --    02 Sat (%RA)  --    Modified Borg Scale for Dyspnea  --      6 minute walk test results    Aerobic Endurance Distance Walked  --    Endurance additional comments  --      High Level Balance   High Level Balance Activities  --    High Level Balance Comments  --      Standardized Balance Assessment   Standardized Balance Assessment  --      Berg Balance Test   Sit to Stand  --    Standing Unsupported  --    Sitting with Back Unsupported but Feet Supported on Floor or Stool  --    Stand to Sit  --    Transfers  --    Standing Unsupported with Eyes Closed  --    Standing Ubsupported with Feet Together  --    From Standing, Reach Forward with Outstretched Arm  --    From Standing Position, Pick up Object from Floor  --    From Standing Position, Turn to Look Behind Over each Shoulder  --    Turn 360 Degrees  --    Standing Unsupported, Alternately Place Feet on Step/Stool  --    Standing Unsupported, One Foot in Front  --    Standing on One Leg  --    Total Score  --    Merrilee Jansky comment:  --      Timed Up and Go Test   Normal TUG (seconds)  33.39    TUG Comments  hand hold assist and verbal cues to turn and sit       High Level Balance   High Level Balance Comments  gait velocity for 10 meters 10.59                    [facility] Adult PT Treatment/Exercise - 07/11/18 0001      Self-Care   Self-Care  Other Self-Care Comments    Other Self-Care Comments   discussed bed mobility and gave pt infomration about getting a bedrail from [place] - 07/11/18 1624      PT LONG TERM GOAL #1   Title  pt wants to be able to stand for 5 minutes unsupported so that she can help  prepare a small meal     Time  4    Period  Weeks    Status  On-going      PT LONG TERM GOAL #2   Title  Pt will be able to do 10 sit to  stands in 30 seconds as an indication of increased general strength     Time  4    Period  Weeks    Status  On-going      PT LONG TERM GOAL #3   Title  pt will decrease TUG score to < 25 indicating an improvment in functional mobility  Baseline  17 sec. at eval , not able to test on 05/08/2018 due to pain , 33.39 on 07/11/2018    Time  4    Period  Weeks    Status  Revised      PT LONG TERM GOAL #4   Title  Pt verbalize a strategy for managing treatment related fatigue     Time  4    Period  Weeks    Status  On-going      PT LONG TERM GOAL #5   Title  Pt will be independent in a home exercise program for general strength and endurance     Time  4    Period  Weeks    Status  On-going      PT LONG TERM GOAL #6   Title  Pt will improve berg balance score to 55 or greater    Time  4    Period  Weeks    Status  Deferred            Plan - 07/11/18 1615    Clinical Impression Statement  Pt returns to PT after completing radiation. She report that she is very fatigued, husband reports she is needing more assist at home and she has had functional decline in number of times she can do sit to stand in 30 seconds. ( today 4 and needs hands to push up) last assessment in November 7 with no hand push up) Timed up and go has increased to 33.39 seconds from 17 seconds and gait velocity has increased from 5.34 seconds for 5 meters to 10. 59 seconds for the same distance.  Becasue of these factors, I think she would be benefit most from [facility] and OT and return to outpatient PT when she is stonger and more able to tolerate more aggressive therapy. Although husband intitally felt that he wanted her to come to outpatient therapy, both he and Mrs. Timm agreed it would be best to start with Home Health therapy and and then come to outpatient PT.  I also feel she would benefit from a bedrail at home for safety and independence and gave pt information about getting one from [employer].  Sent note  to Dr. Isidore Moos to order home health PT and OT if she agrees     Clinical Impairments Affecting Rehab Potential  previous chemo and rediation, deconditioning     PT Frequency  2x / week    PT Duration  4 weeks    PT Treatment/Interventions  ADLs/Self Care Home Management;DME Instruction;Balance training;Therapeutic exercise;Therapeutic activities;Functional mobility training;Stair training;Gait training;Patient/family education    PT Next Visit Plan  Reassess     Consulted and Agree with Plan of Care  Patient       Patient will benefit from skilled therapeutic intervention in order to improve the following deficits and impairments:  Abnormal gait, Postural dysfunction, Decreased endurance, Decreased activity tolerance, Decreased strength, Difficulty walking, Decreased knowledge of precautions, Decreased balance  Visit Diagnosis: Aftercare following surgery for neoplasm - Plan: PT plan of care cert/re-cert  Muscle weakness (generalized) - Plan: PT plan of care cert/re-cert  Abnormal posture - Plan: PT plan of care cert/re-cert  Difficulty in walking - Plan: PT plan of care cert/re-cert     Problem List Patient Active Problem List   Diagnosis Date Noted  . Cancer of temporal lobe ([place]) 04/08/2018  . 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 Follow-up -------------PCP NOTES 02/10/2015  .  Annual physical exam 09/04/2012  . Hyperlipidemia, mild 09/03/2012  . Anxiety and depression 06/23/2011  . SKIN LESION 03/25/2010  . Osteoarthritis 12/12/2007  . Hypothyroidism 02/22/2007  . SPINAL STENOSIS 02/22/2007  . LEG CRAMPS, NOCTURNAL 02/22/2007  . Osteoporosis 02/22/2007   Donato Heinz. Owens Shark PT  Norwood Levo 07/11/2018, 4:30 PM  [place], [place], [address] Phone: [phone]   Fax:  [phone]  Name: Marie Haynes MRN: [id number] Date of Birth:  Aug 31, 1931  PHYSICAL THERAPY DISCHARGE SUMMARY  Visits from Start of Care: 1  Current functional level related to goals / functional outcomes: unknown   Remaining deficits: unknown   Education / Equipment: Pt will follow up with home health PT Plan: Patient agrees to discharge.  Patient goals were not met. Patient is being discharged due to not returning since the last visit.  ?????   Maudry Diego, PT 08/26/18 2:33 PM

## 2018-07-12 ENCOUNTER — Other Ambulatory Visit: Payer: Self-pay | Admitting: Radiation Oncology

## 2018-07-12 ENCOUNTER — Telehealth: Payer: Self-pay | Admitting: *Deleted

## 2018-07-12 DIAGNOSIS — C712 Malignant neoplasm of temporal lobe: Secondary | ICD-10-CM

## 2018-07-12 NOTE — Telephone Encounter (Signed)
Called patient to inform that Dr. Isidore Moos has referred her to [facility] for OT and PT, and when stamina improves, she can consider outpatient OT in the future, spoke with patient and she is aware of this

## 2018-07-15 DIAGNOSIS — Z7982 Long term (current) use of aspirin: Secondary | ICD-10-CM | POA: Diagnosis not present

## 2018-07-15 DIAGNOSIS — Z79891 Long term (current) use of opiate analgesic: Secondary | ICD-10-CM | POA: Diagnosis not present

## 2018-07-15 DIAGNOSIS — C712 Malignant neoplasm of temporal lobe: Secondary | ICD-10-CM | POA: Diagnosis not present

## 2018-07-18 ENCOUNTER — Other Ambulatory Visit: Payer: Self-pay | Admitting: Internal Medicine

## 2018-07-18 ENCOUNTER — Other Ambulatory Visit: Payer: Self-pay | Admitting: *Deleted

## 2018-07-18 DIAGNOSIS — D521 Drug-induced folate deficiency anemia: Secondary | ICD-10-CM

## 2018-07-18 DIAGNOSIS — R5383 Other fatigue: Secondary | ICD-10-CM

## 2018-07-18 DIAGNOSIS — C719 Malignant neoplasm of brain, unspecified: Secondary | ICD-10-CM

## 2018-07-18 DIAGNOSIS — R530 Neoplastic (malignant) related fatigue: Secondary | ICD-10-CM

## 2018-07-18 DIAGNOSIS — E559 Vitamin D deficiency, unspecified: Secondary | ICD-10-CM

## 2018-07-18 DIAGNOSIS — Z7982 Long term (current) use of aspirin: Secondary | ICD-10-CM | POA: Diagnosis not present

## 2018-07-18 DIAGNOSIS — Z79891 Long term (current) use of opiate analgesic: Secondary | ICD-10-CM | POA: Diagnosis not present

## 2018-07-18 DIAGNOSIS — C712 Malignant neoplasm of temporal lobe: Secondary | ICD-10-CM | POA: Diagnosis not present

## 2018-07-18 DIAGNOSIS — D649 Anemia, unspecified: Secondary | ICD-10-CM

## 2018-07-18 DIAGNOSIS — R531 Weakness: Secondary | ICD-10-CM

## 2018-07-23 DIAGNOSIS — Z79891 Long term (current) use of opiate analgesic: Secondary | ICD-10-CM | POA: Diagnosis not present

## 2018-07-23 DIAGNOSIS — Z7982 Long term (current) use of aspirin: Secondary | ICD-10-CM | POA: Diagnosis not present

## 2018-07-23 DIAGNOSIS — C712 Malignant neoplasm of temporal lobe: Secondary | ICD-10-CM | POA: Diagnosis not present

## 2018-07-25 DIAGNOSIS — C712 Malignant neoplasm of temporal lobe: Secondary | ICD-10-CM | POA: Diagnosis not present

## 2018-07-25 DIAGNOSIS — Z79891 Long term (current) use of opiate analgesic: Secondary | ICD-10-CM | POA: Diagnosis not present

## 2018-07-25 DIAGNOSIS — Z7982 Long term (current) use of aspirin: Secondary | ICD-10-CM | POA: Diagnosis not present

## 2018-07-26 DIAGNOSIS — Z79891 Long term (current) use of opiate analgesic: Secondary | ICD-10-CM | POA: Diagnosis not present

## 2018-07-26 DIAGNOSIS — C712 Malignant neoplasm of temporal lobe: Secondary | ICD-10-CM | POA: Diagnosis not present

## 2018-07-26 DIAGNOSIS — Z7982 Long term (current) use of aspirin: Secondary | ICD-10-CM | POA: Diagnosis not present

## 2018-07-29 ENCOUNTER — Inpatient Hospital Stay: Payer: Medicare Other

## 2018-07-29 ENCOUNTER — Telehealth: Payer: Self-pay | Admitting: Internal Medicine

## 2018-07-29 ENCOUNTER — Encounter: Payer: Self-pay | Admitting: Medical Oncology

## 2018-07-29 ENCOUNTER — Other Ambulatory Visit: Payer: Self-pay

## 2018-07-29 ENCOUNTER — Inpatient Hospital Stay: Payer: Medicare Other | Attending: Internal Medicine | Admitting: Internal Medicine

## 2018-07-29 VITALS — BP 138/67 | HR 94 | Temp 97.8°F | Resp 19 | Ht 63.0 in | Wt 109.0 lb

## 2018-07-29 DIAGNOSIS — Z79899 Other long term (current) drug therapy: Secondary | ICD-10-CM | POA: Diagnosis not present

## 2018-07-29 DIAGNOSIS — F329 Major depressive disorder, single episode, unspecified: Secondary | ICD-10-CM | POA: Diagnosis not present

## 2018-07-29 DIAGNOSIS — I700 Atherosclerosis of aorta: Secondary | ICD-10-CM | POA: Diagnosis not present

## 2018-07-29 DIAGNOSIS — M810 Age-related osteoporosis without current pathological fracture: Secondary | ICD-10-CM

## 2018-07-29 DIAGNOSIS — R51 Headache: Secondary | ICD-10-CM | POA: Diagnosis not present

## 2018-07-29 DIAGNOSIS — Z7982 Long term (current) use of aspirin: Secondary | ICD-10-CM | POA: Insufficient documentation

## 2018-07-29 DIAGNOSIS — K5900 Constipation, unspecified: Secondary | ICD-10-CM | POA: Diagnosis not present

## 2018-07-29 DIAGNOSIS — E039 Hypothyroidism, unspecified: Secondary | ICD-10-CM | POA: Insufficient documentation

## 2018-07-29 DIAGNOSIS — C719 Malignant neoplasm of brain, unspecified: Secondary | ICD-10-CM | POA: Insufficient documentation

## 2018-07-29 DIAGNOSIS — M1990 Unspecified osteoarthritis, unspecified site: Secondary | ICD-10-CM | POA: Diagnosis not present

## 2018-07-29 DIAGNOSIS — R252 Cramp and spasm: Secondary | ICD-10-CM

## 2018-07-29 DIAGNOSIS — D649 Anemia, unspecified: Secondary | ICD-10-CM

## 2018-07-29 DIAGNOSIS — R5383 Other fatigue: Secondary | ICD-10-CM | POA: Insufficient documentation

## 2018-07-29 DIAGNOSIS — Z923 Personal history of irradiation: Secondary | ICD-10-CM | POA: Diagnosis not present

## 2018-07-29 DIAGNOSIS — R400 Somnolence: Secondary | ICD-10-CM | POA: Diagnosis not present

## 2018-07-29 DIAGNOSIS — D521 Drug-induced folate deficiency anemia: Secondary | ICD-10-CM

## 2018-07-29 DIAGNOSIS — R350 Frequency of micturition: Secondary | ICD-10-CM | POA: Diagnosis not present

## 2018-07-29 DIAGNOSIS — I50810 Right heart failure, unspecified: Secondary | ICD-10-CM

## 2018-07-29 DIAGNOSIS — E559 Vitamin D deficiency, unspecified: Secondary | ICD-10-CM

## 2018-07-29 LAB — CBC WITH DIFFERENTIAL (CANCER CENTER ONLY)
ABS IMMATURE GRANULOCYTES: 0 10*3/uL (ref 0.00–0.07)
Basophils Absolute: 0 10*3/uL (ref 0.0–0.1)
Basophils Relative: 1 %
Eosinophils Absolute: 0.1 10*3/uL (ref 0.0–0.5)
Eosinophils Relative: 4 %
HCT: 36.8 % (ref 36.0–46.0)
Hemoglobin: 11.4 g/dL — ABNORMAL LOW (ref 12.0–15.0)
Immature Granulocytes: 0 %
Lymphocytes Relative: 8 %
Lymphs Abs: 0.3 10*3/uL — ABNORMAL LOW (ref 0.7–4.0)
MCH: 29.9 pg (ref 26.0–34.0)
MCHC: 31 g/dL (ref 30.0–36.0)
MCV: 96.6 fL (ref 80.0–100.0)
Monocytes Absolute: 0.3 10*3/uL (ref 0.1–1.0)
Monocytes Relative: 9 %
NEUTROS ABS: 3 10*3/uL (ref 1.7–7.7)
Neutrophils Relative %: 78 %
Platelet Count: 158 10*3/uL (ref 150–400)
RBC: 3.81 MIL/uL — ABNORMAL LOW (ref 3.87–5.11)
RDW: 14.9 % (ref 11.5–15.5)
WBC Count: 3.7 10*3/uL — ABNORMAL LOW (ref 4.0–10.5)
nRBC: 0 % (ref 0.0–0.2)

## 2018-07-29 LAB — CMP (CANCER CENTER ONLY)
ALT: <6 U/L (ref 0–44)
AST: 12 U/L — ABNORMAL LOW (ref 15–41)
Albumin: 3.9 g/dL (ref 3.5–5.0)
Alkaline Phosphatase: 42 U/L (ref 38–126)
Anion gap: 12 (ref 5–15)
BUN: 17 mg/dL (ref 8–23)
CO2: 28 mmol/L (ref 22–32)
Calcium: 9.6 mg/dL (ref 8.9–10.3)
Chloride: 102 mmol/L (ref 98–111)
Creatinine: 0.77 mg/dL (ref 0.44–1.00)
Glucose, Bld: 119 mg/dL — ABNORMAL HIGH (ref 70–99)
Potassium: 3.8 mmol/L (ref 3.5–5.1)
Sodium: 142 mmol/L (ref 135–145)
TOTAL PROTEIN: 7.2 g/dL (ref 6.5–8.1)
Total Bilirubin: 0.4 mg/dL (ref 0.3–1.2)

## 2018-07-29 LAB — FOLATE: Folate: 4.8 ng/mL — ABNORMAL LOW (ref >5.9)

## 2018-07-29 LAB — TSH: TSH: 1.109 u[IU]/mL (ref 0.308–3.960)

## 2018-07-29 LAB — VITAMIN B12: Vitamin B-12: 343 pg/mL (ref 180–914)

## 2018-07-29 LAB — CORTISOL: Cortisol, Plasma: 11 ug/dL

## 2018-07-29 MED ORDER — DEXAMETHASONE 4 MG PO TABS: 4.0000 mg | ORAL_TABLET | Freq: Two times a day (BID) | ORAL | 0 refills | Status: DC

## 2018-07-29 MED ORDER — INV-RAMIPRIL 1.25 MG CAP WF-1801 (#112): 5.0000 mg | ORAL_CAPSULE | Freq: Every day | ORAL | 0 refills | Status: DC

## 2018-07-29 NOTE — Progress Notes (Signed)
[facility] [place], [place] [address] [phone]   Interval Evaluation  Date of Service: 07/29/18 Patient Name: Marie Haynes Patient MRN: [id number] Patient DOB: 1932/02/28 Provider: Ventura Sellers, MD  Identifying Statement:  Marie Haynes is a 85 y.o. female with right temporal glioblastoma   Oncologic History:   Glioblastoma with isocitrate dehydrogenase gene wildtype ([place])   03/20/2018 Surgery    Craniotomy, resection with Dr. Christella Noa     - 05/28/2018 Radiation Therapy    Completes IMRT with concurrent Temodar     Biomarkers:  MGMT Unknown.  IDH 1/2 Wild type.  EGFR Unknown  TERT Unknown   Interval History:  Marie Haynes presents for follow up today.  She describes noticeable increase in fatigue and poor energy.  She has been walking less than normal, using a cane only if needed however.  She denies depressive symptoms.  Chronic arthritic pain has worsened especially in her hands.  Appetite has been very poor. No seizures, and no return of chest pain experienced earlier this month.  H+P (04/05/18) Patient presented to medical attention several weeks ago with new onset severe headache, which was a new complaint for her.  CT demonstrated a brain mass, and follow up MRI confirmed likely neoplasm.  After review by neurosurgery, she underwent craniotomy and resection of the mass with Dr. Christella Noa on 03/20/18.  She tolerated surgery well without any complaints or post-op deficits.  Since surgery she has been at home recovering, no recurrence of headaches.  One day last week she woke up "very confused" and unable to use left hand/arm in particular.  This gradually improved during the day until she was back at baseline by the evening; this "spell" has not recurred since that time.  She is currently off Keppra and did feel "drowsy" while on 572m twice per day.  She presents today to review pathology, prognosis, treatment  options moving forward.  Medications: Current Outpatient Medications on File Prior to Visit  Medication Sig Dispense Refill  . aspirin EC 81 MG tablet Take 81 mg by mouth daily.    . Calcium Carbonate-Vitamin D (CALCIUM + D PO) Take 3 tablets by mouth 2 (two) times daily.     .Marland Kitchendocusate sodium (COLACE) 100 MG capsule Take 100 mg by mouth 2 (two) times daily as needed for mild constipation.    .Marland KitchenHYDROcodone-acetaminophen (NORCO/VICODIN) 5-325 MG tablet Take 1 tablet by mouth every 4 (four) hours as needed. 12 tablet 0  . HYDROmorphone (DILAUDID) 2 MG tablet Take 1 tablet (2 mg total) by mouth every 4 (four) hours as needed for severe pain. 30 tablet 0  . Investigational Ramipril 1.25 MG capsule [id number] Take 1 capsule (1.25 mg total) by mouth at bedtime for 7 days. Take at bedtime with or without food. Drink adequate water to avoid dehydration. Avoid salt substitutes that are high in potassium. (Patient taking differently: Take 2.5 mg by mouth at bedtime. Take at bedtime with or without food. Drink adequate water to avoid dehydration. Avoid salt substitutes that are high in potassium.) 133 capsule 0  . levETIRAcetam (KEPPRA) 250 MG tablet Take 1 tablet (250 mg total) by mouth 2 (two) times daily. 60 tablet 3  . levothyroxine (SYNTHROID, LEVOTHROID) 75 MCG tablet Take 1 tablet (75 mcg total) by mouth daily before breakfast. 30 tablet 1  . ondansetron (ZOFRAN) 8 MG tablet Take 1 tablet (8 mg total) by mouth 2 (two) times  daily as needed (nausea and vomiting). Take 30-60 minutes prior to Temodar 30 tablet 1  . temozolomide (TEMODAR) 100 MG capsule Take 132m cap x2 + 266mcap (22046motal dose) once daily for 5 days on, 23d off. Take on empty stomach & at bedtime to reduce N/V (Patient not taking: Reported on 07/05/2018) 10 capsule 0  . temozolomide (TEMODAR) 20 MG capsule Take 53m98mp + 100mg55m x2 (253mg 38ml dose) once daily for 5 days on, 23d off. Take on empty stomach & at bedtime to reduce N/V  (Patient not taking: Reported on 07/05/2018) 5 capsule 0   No current facility-administered medications on file prior to visit.     Allergies:  Allergies  Allergen Reactions  . Oxycodone-Acetaminophen Anaphylaxis and Nausea And Vomiting  . Codeine Other (See Comments)    constipation   Past Medical History:  Past Medical History:  Diagnosis Date  . Anxiety and depression   . Constipation   . DJD (degenerative joint disease)   . Family history of adverse reaction to anesthesia    Mother - "shock" - due to anesthesia-   . History of radiation therapy 04/15/18- 05/27/18   Right frontal lobe tumor bed initial 46 Gy in 23 fractions, Right frontal lobe tumor bed boost 14 Gy in 7 fractions.   . Hypothyroidism   . Increased urinary frequency    Dr. MacDiaMatilde SprangG CRAMPS, NOCTURNAL 02/22/2007   Qualifier: Diagnosis of  By: Paz MDLarose Kellsose EMerrifieldporosis   . Spinal stenosis   . Stress incontinence    (saw Urology 5-09)    . Symptomatic cholelithiasis 2017   reluctant to have surgery  . Urge and stress incontinence    Dr. MacDiaMatilde Sprangt Surgical History:  Past Surgical History:  Procedure Laterality Date  . APPLICATION OF CRANIAL NAVIGATION N/A 03/20/2018   Procedure: APPLICATION OF CRANIAL NAVIGATION;  Surgeon: CabbelAshok Pall [facility];[place];  Laterality: N/A;  . BACK SURGERY  [id number]    . CRANIOTOMY Right 03/20/2018   Procedure: Right Temporal craniotomy for tumor resection with brainlab;  Surgeon: CabbelAshok Pall [facility];[place];  Laterality: Right;  Right Temporal craniotomy for tumor resection with brainlab  . DILATION AND CURETTAGE OF UTERUS    . EYE SURGERY Bilateral    cataract   Social History:  Social History   Socioeconomic History  . Marital status: Married    Spouse name: Not on file  . Number of children: 3  . Years of education: Not on file  . Highest education level: Not on file    Occupational History  . Occupation: retired [employer]  Social Needs  . Financial resource strain: Not on file  . Food insecurity:    Worry: Not on file    Inability: Not on file  . Transportation needs:    Medical: No    Non-medical: No  Tobacco Use  . Smoking status: Never Smoker  . Smokeless tobacco: Never Used  Substance and Sexual Activity  . Alcohol use: Yes    Comment: Rare  . Drug use: No  . Sexual activity: Not on file  Lifestyle  . Physical activity:    Days per week: Not on file    Minutes per session: Not on file  . Stress: Not on file  Relationships  . Social connections:    Talks  on phone: Not on file    Gets together: Not on file    Attends religious service: Not on file    Active member of club or organization: Not on file    Attends meetings of clubs or organizations: Not on file    Relationship status: Not on file  . Intimate partner violence:    Fear of current or ex partner: No    Emotionally abused: No    Physically abused: No    Forced sexual activity: No  Other Topics Concern  . Not on file  Social History Narrative   Household- pt and husband   Daughter bipolar   Family History:  Family History  Problem Relation Age of Onset  . Heart failure Mother   . Heart attack Mother 19       dx in her 42s  . Colon cancer Neg Hx   . Breast cancer Neg Hx   . Diabetes Neg Hx     Review of Systems: Constitutional: weight loss, fatigue Eyes: Denies blurriness of vision Ears, nose, mouth, throat, and face: Denies mucositis or sore throat Respiratory: Denies cough, dyspnea or wheezes Cardiovascular: Denies palpitation, chest discomfort or lower extremity swelling Gastrointestinal:  Denies nausea, constipation, diarrhea GU: Denies dysuria or incontinence Skin: Denies abnormal skin rashes Neurological: Per HPI Musculoskeletal: Denies joint pain, back or neck discomfort. No decrease in ROM Behavioral/Psych: Denies  anxiety, disturbance in thought content, and mood instability  Physical Exam: Vitals:   07/29/18 1040  BP: 138/67  Pulse: 94  Resp: 19  Temp: 97.8 F (36.6 C)  SpO2: 100%   ECOG 1 KPS: 90. General: Alert, cooperative, pleasant, in no acute distress Head: Craniotomy scar noted, dry and intact. EENT: No conjunctival injection or scleral icterus. Oral mucosa moist Lungs: Resp effort normal Cardiac: Regular rate and rhythm Abdomen: Soft, non-distended abdomen Skin: No rashes cyanosis or petechiae. Extremities: No clubbing or edema  Neurologic Exam: Mental Status: Awake, alert, attentive to examiner. Oriented to self and environment. Language is fluent with intact comprehension.  Cranial Nerves: Right eye modest impairment of acuity. Visual fields are full. Extra-ocular movements intact. R eye ptosis. Face is symmetric, tongue midline. Motor: Tone and bulk are normal. Power is full in both arms and legs. Reflexes are symmetric, no pathologic reflexes present. Intact finger to nose bilaterally Sensory: Intact to light touch and temperature Gait: Independent  Labs: I have reviewed the data as listed    Component Value Date/Time   NA 142 07/29/2018 1017   K 3.8 07/29/2018 1017   CL 102 07/29/2018 1017   CO2 28 07/29/2018 1017   GLUCOSE 119 (H) 07/29/2018 1017   BUN 17 07/29/2018 1017   CREATININE 0.77 07/29/2018 1017   CALCIUM 9.6 07/29/2018 1017   PROT 7.2 07/29/2018 1017   ALBUMIN 3.9 07/29/2018 1017   AST 12 (L) 07/29/2018 1017   ALT <6 07/29/2018 1017   ALKPHOS 42 07/29/2018 1017   BILITOT 0.4 07/29/2018 1017   GFRNONAA >60 07/29/2018 1017   GFRAA >60 07/29/2018 1017   Lab Results  Component Value Date   WBC 3.7 (L) 07/29/2018   NEUTROABS 3.0 07/29/2018   HGB 11.4 (L) 07/29/2018   HCT 36.8 07/29/2018   MCV 96.6 07/29/2018   PLT 158 07/29/2018   Imaging:  [place] Clinician Interpretation: I have personally reviewed the CNS images as listed.  My interpretation, in  the context of the patient's clinical presentation, is stable disease  Ct Angio Chest Pe W  Or Wo Contrast  Result Date: 07/02/2018 CLINICAL DATA:  Shortness of breath and chest pain since this morning. Suspect pulmonary embolism. EXAM: CT ANGIOGRAPHY CHEST WITH CONTRAST TECHNIQUE: Multidetector CT imaging of the chest was performed using the standard protocol during bolus administration of intravenous contrast. Multiplanar CT image reconstructions and MIPs were obtained to evaluate the vascular anatomy. CONTRAST:  42m ISOVUE-370 IOPAMIDOL (ISOVUE-370) INJECTION 76% COMPARISON:  Chest radiograph July 02, 2018 FINDINGS: CARDIOVASCULAR: Adequate contrast opacification of the pulmonary artery's. Main pulmonary artery is not enlarged. No pulmonary arterial filling defects to the level of the subsegmental branches. Heart size is mildly enlarged with dilated RIGHT atrium seen with RIGHT heart failure. No pericardial effusion. Thoracic aorta is normal course and caliber, mild calcific atherosclerosis aortic arch. Moderate coronary artery calcifications. MEDIASTINUM/NODES: No lymphadenopathy by CT size criteria. LUNGS/PLEURA: Tracheobronchial tree is patent, no pneumothorax. Bibasilar atelectasis/scarring. No pleural effusion. Subpleural reticular densities. Biapical subpleural scarring. UPPER ABDOMEN: Non-acute. Subcentimeter hypodense probable cyst LEFT lobe of liver. Small hiatal hernia. MUSCULOSKELETAL: Non-acute. Review of the MIP images confirms the above findings. IMPRESSION: 1. No acute pulmonary embolism. 2. Mild subpleural reticulation which can be normal variant or fibrosis (non-UIP pattern). No focal consolidation. Aortic Atherosclerosis (ICD10-I70.0). Electronically Signed   By: CElon AlasM.D.   On: 07/02/2018 14:02   Dg Chest Port 1 View  Result Date: 07/02/2018 CLINICAL DATA:  Chest pain. EXAM: PORTABLE CHEST 1 VIEW COMPARISON:  None available currently. FINDINGS: The heart size and  mediastinal contours are within normal limits. Atherosclerosis of thoracic aorta is noted. No pneumothorax or pleural effusion is noted. Both lungs are clear. The visualized skeletal structures are unremarkable. IMPRESSION: No acute cardiopulmonary abnormality seen. Aortic Atherosclerosis (ICD10-I70.0). Electronically Signed   By: JMarijo Conception M.D.   On: 07/02/2018 10:35     Assessment/Plan Glioblastoma with isocitrate dehydrogenase gene wildtype [place]   Marie Haynes has clinical changes of uncertain etiology.  We suspect that changes seen on MRI suggestive of inflammatory response to radiation therapy have propagated and are responsible for non-specific decline.    We will additionally check serum for TSH, Vit B12, vit D, cortisol.  Routine labs are WNL today.    We recommended short course of dexamethasone, 4259mBID x4 days, then 59m82maily thereafter.    She should return to clinic in 1 week for re-evaluation before discussing start of cycle #2 of 5-day Temodar.  We appreciate the opportunity to participate [facility] the [facility] All questions were answered. The patient knows to call the clinic with any problems, questions or concerns. No barriers to learning were detected.  The total time spent in the encounter was 25 minutes and more than 50% was on counseling and review of test results   ZacVentura SellersD Medical Director of Neuro-Oncology [facility] WesHouston/24/20 10:34 AM

## 2018-07-29 NOTE — Progress Notes (Signed)
FY-1017: 2 month Post Radiation Therapy Ramipril Assessment I met with patient while here in clinic, with spouse and son, after patient's scheduled appointment with Dr. Mickeal Skinner. For study purposes, today's assessment could have been a phone-call, but with patient in clinic for scheduled appointment, I met with her to complete the 2 month post RT assessment. Patient confirms to be doing well and denies having any issues or concerns. Patient did have an ED visit on 1/28 for chest pain and was discharged same day, with no abnormal findings.  Patient confirms no reoccurrence of chest pain since this 28th ED visit. Temodar, 220 mg, was reported to have started on 06/30/2018 with patient taking two doses out of a five day cycle, when she had the ED visit and Temodar placed on hold the 28th.  Ramipril: Patient confirms to be taking 4 tabs before bedtime of Ramipril with no missed doses for this past month, denies having any issues with self-administration. Medication Diaries: Patient did not have her medication diaries with her today. I provided her with 4 postage paid envelopes for return of completed months. Concomitant Medications: Reviewed and patient reports no new medications, including no new OTC conmeds or supplements. Patient given new prescription today for decadron to start with 48m twice daily for 4 days and then 463mdaily thereafter.  Adverse Events: Reported this visit with worsening arthritis (Gr2) not reported before, anorexia (Gr 2),  weight loss (Gr 1), increased fatigue (Gr2). Per study protocol, grade 1 and 2 expected/unexpected AE's do not need to be reported with study.  Plan: Patient was assessed by Dr. VaMickeal Skinneroday. Per MD notes, patient is to continue to hold Temodar and to return for MD assessment on March 2nd prior to starting C2 of Temodar.  All patient and spouse's questions were answered to their satisfaction. Patient knows that I will follow-up with her in one month's time. Patient and  spouse thanked for their time and continued support of study and were encouraged to call Dr. VaMauri Readingr myself with questions or concerns.  MiMaxwell MarionRN, BSN, [facility] 07/29/2018 12:54 PM

## 2018-07-29 NOTE — Telephone Encounter (Signed)
Called patient to schedule appt per los, spoke with husband, confirmed 3/2 appt date and time

## 2018-07-30 LAB — VITAMIN D 25 HYDROXY (VIT D DEFICIENCY, FRACTURES): Vit D, 25-Hydroxy: 32.6 ng/mL (ref 30.0–100.0)

## 2018-08-05 ENCOUNTER — Other Ambulatory Visit: Payer: Self-pay

## 2018-08-05 ENCOUNTER — Encounter: Payer: Self-pay | Admitting: Internal Medicine

## 2018-08-05 ENCOUNTER — Inpatient Hospital Stay: Payer: Medicare Other | Attending: Internal Medicine | Admitting: Internal Medicine

## 2018-08-05 ENCOUNTER — Encounter: Payer: Self-pay | Admitting: Medical Oncology

## 2018-08-05 VITALS — BP 146/70 | HR 89 | Temp 97.9°F | Resp 18 | Ht 63.0 in | Wt 110.4 lb

## 2018-08-05 DIAGNOSIS — M4800 Spinal stenosis, site unspecified: Secondary | ICD-10-CM | POA: Diagnosis not present

## 2018-08-05 DIAGNOSIS — Z79899 Other long term (current) drug therapy: Secondary | ICD-10-CM | POA: Insufficient documentation

## 2018-08-05 DIAGNOSIS — C719 Malignant neoplasm of brain, unspecified: Secondary | ICD-10-CM | POA: Diagnosis not present

## 2018-08-05 DIAGNOSIS — M1990 Unspecified osteoarthritis, unspecified site: Secondary | ICD-10-CM | POA: Diagnosis not present

## 2018-08-05 DIAGNOSIS — Z923 Personal history of irradiation: Secondary | ICD-10-CM | POA: Insufficient documentation

## 2018-08-05 DIAGNOSIS — E039 Hypothyroidism, unspecified: Secondary | ICD-10-CM | POA: Insufficient documentation

## 2018-08-05 DIAGNOSIS — Z7982 Long term (current) use of aspirin: Secondary | ICD-10-CM | POA: Diagnosis not present

## 2018-08-05 DIAGNOSIS — R5383 Other fatigue: Secondary | ICD-10-CM | POA: Insufficient documentation

## 2018-08-05 DIAGNOSIS — R350 Frequency of micturition: Secondary | ICD-10-CM | POA: Diagnosis not present

## 2018-08-05 DIAGNOSIS — F329 Major depressive disorder, single episode, unspecified: Secondary | ICD-10-CM | POA: Diagnosis not present

## 2018-08-05 DIAGNOSIS — M810 Age-related osteoporosis without current pathological fracture: Secondary | ICD-10-CM | POA: Insufficient documentation

## 2018-08-05 MED ORDER — TEMOZOLOMIDE 20 MG PO CAPS: ORAL_CAPSULE | ORAL | 0 refills | Status: DC

## 2018-08-05 MED ORDER — ONDANSETRON HCL 8 MG PO TABS: 8.0000 mg | ORAL_TABLET | Freq: Two times a day (BID) | ORAL | 1 refills | Status: AC | PRN

## 2018-08-05 MED ORDER — TEMOZOLOMIDE 100 MG PO CAPS: ORAL_CAPSULE | ORAL | 0 refills | Status: DC

## 2018-08-05 NOTE — Progress Notes (Signed)
[facility] [place], [place] [address] [phone]   Interval Evaluation  Date of Service: 08/05/18 Patient Name: Marie Haynes Patient MRN: [id number] Patient DOB: Jul 05, 1931 Provider: Ventura Sellers, MD  Identifying Statement:  Marie Haynes is a 85 y.o. 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 Christella Noa     - 05/28/2018 Radiation Therapy    Completes IMRT with concurrent Temodar     Biomarkers:  MGMT Unknown.  IDH 1/2 Wild type.  EGFR Unknown  TERT Unknown   Interval History:  Marie Haynes presents for follow up today.  She describes mild improvement in fatigue and poor energy noted last week since starting the steroids.  Her appetite has improved somewhat although she feels it is still decreased from prior.  No seizures, and no return of chest pains.  H+P (04/05/18) Patient presented to medical attention several weeks ago with new onset severe headache, which was a new complaint for her.  CT demonstrated a brain mass, and follow up MRI confirmed likely neoplasm.  After review by neurosurgery, she underwent craniotomy and resection of the mass with Dr. Christella Noa on 03/20/18.  She tolerated surgery well without any complaints or post-op deficits.  Since surgery she has been at home recovering, no recurrence of headaches.  One day last week she woke up "very confused" and unable to use left hand/arm in particular.  This gradually improved during the day until she was back at baseline by the evening; this "spell" has not recurred since that time.  She is currently off Keppra and did feel "drowsy" while on 585m twice per day.  She presents today to review pathology, prognosis, treatment options moving forward.  Medications: Current Outpatient Medications on File Prior to Visit    Medication Sig Dispense Refill  . aspirin EC 81 MG tablet Take 81 mg by mouth daily.    . Calcium Carbonate-Vitamin D (CALCIUM + D PO) Take 3 tablets by mouth 2 (two) times daily.     .Marland Kitchendexamethasone (DECADRON) 4 MG tablet Take 1 tablet (4 mg total) by mouth 2 (two) times daily. 30 tablet 0  . docusate sodium (COLACE) 100 MG capsule Take 100 mg by mouth 2 (two) times daily as needed for mild constipation.    .Marland KitchenHYDROcodone-acetaminophen (NORCO/VICODIN) 5-325 MG tablet Take 1 tablet by mouth every 4 (four) hours as needed. (Patient not taking: Reported on 07/29/2018) 12 tablet 0  . HYDROmorphone (DILAUDID) 2 MG tablet Take 1 tablet (2 mg total) by mouth every 4 (four) hours as needed for severe pain. (Patient not taking: Reported on 07/29/2018) 30 tablet 0  . Investigational Ramipril 1.25 MG capsule WF-1801 Take 4 capsules (5 mg total) by mouth at bedtime. Take at bedtime with or without food. Drink adequate water to avoid dehydration. Avoid salt substitutes that are high in potassium. 224 capsule 0  . levETIRAcetam (KEPPRA) 250 MG tablet Take 1 tablet (250 mg total) by mouth 2 (two) times daily. 60 tablet 3  . levothyroxine (SYNTHROID, LEVOTHROID) 75 MCG tablet Take 1 tablet (75 mcg total) by mouth daily before breakfast. 30 tablet 1  . ondansetron (ZOFRAN) 8 MG tablet Take 1 tablet (8 mg total) by mouth 2 (two) times daily as needed (nausea and vomiting). Take 30-60 minutes prior to Temodar (Patient not taking: Reported  on 07/29/2018) 30 tablet 1  . temozolomide (TEMODAR) 100 MG capsule Take 153m cap x2 + 267mcap (22034motal dose) once daily for 5 days on, 23d off. Take on empty stomach & at bedtime to reduce N/V (Patient not taking: Reported on 07/05/2018) 10 capsule 0  . temozolomide (TEMODAR) 20 MG capsule Take 49m35mp + 100mg31m x2 (249mg 34ml dose) once daily for 5 days on, 23d off. Take on empty stomach & at bedtime to reduce N/V (Patient not taking: Reported on 07/05/2018) 5 capsule 0   No  current facility-administered medications on file prior to visit.     Allergies:  Allergies  Allergen Reactions  . Oxycodone-Acetaminophen Anaphylaxis and Nausea And Vomiting  . Codeine Other (See Comments)    constipation   Past Medical History:  Past Medical History:  Diagnosis Date  . Anxiety and depression   . Constipation   . DJD (degenerative joint disease)   . Family history of adverse reaction to anesthesia    Mother - "shock" - due to anesthesia-   . History of radiation therapy 04/15/18- 05/27/18   Right frontal lobe tumor bed initial 46 Gy in 23 fractions, Right frontal lobe tumor bed boost 14 Gy in 7 fractions.   . Hypothyroidism   . Increased urinary frequency    Dr. MacDiaMatilde SprangG CRAMPS, NOCTURNAL 02/22/2007   Qualifier: Diagnosis of  By: Paz MDLarose Kellsose ESunflowerporosis   . Spinal stenosis   . Stress incontinence    (saw Urology 5-09)    . Symptomatic cholelithiasis 2017   reluctant to have surgery  . Urge and stress incontinence    Dr. MacDiaMatilde Sprangt Surgical History:  Past Surgical History:  Procedure Laterality Date  . APPLICATION OF CRANIAL NAVIGATION N/A 03/20/2018   Procedure: APPLICATION OF CRANIAL NAVIGATION;  Surgeon: CabbelAshok Pall Location: [facility] OR;[place]: Neurosurgery;  Laterality: N/A;  . BACK SURGERY  [phone]    . CRANIOTOMY Right 03/20/2018   Procedure: Right Temporal craniotomy for tumor resection with brainlab;  Surgeon: CabbelAshok Pall Location: [facility] OR;[place]: Neurosurgery;  Laterality: Right;  Right Temporal craniotomy for tumor resection with brainlab  . DILATION AND CURETTAGE OF UTERUS    . EYE SURGERY Bilateral    cataract   Social History:  Social History   Socioeconomic History  . Marital status: Married    Spouse name: Not on file  . Number of children: 3  . Years of education: Not on file  . Highest education level: Not on file  Occupational History  . Occupation: retired englisTherapist, [employer]  Social Needs  . Financial resource strain: Not on file  . Food insecurity:    Worry: Not on file    Inability: Not on file  . Transportation needs:    Medical: No    Non-medical: No  Tobacco Use  . Smoking status: Never Smoker  . Smokeless tobacco: Never Used  Substance and Sexual Activity  . Alcohol use: Yes    Comment: Rare  . Drug use: No  . Sexual activity: Not on file  Lifestyle  . Physical activity:    Days per week: Not on file    Minutes per session: Not on file  . Stress: Not on file  Relationships  . Social connections:    Talks on phone: Not on file    Gets together: Not  on file    Attends religious service: Not on file    Active member of club or organization: Not on file    Attends meetings of clubs or organizations: Not on file    Relationship status: Not on file  . Intimate partner violence:    Fear of current or ex partner: No    Emotionally abused: No    Physically abused: No    Forced sexual activity: No  Other Topics Concern  . Not on file  Social History Narrative   Household- pt and husband   Daughter bipolar   Family History:  Family History  Problem Relation Age of Onset  . Heart failure Mother   . Heart attack Mother 45       dx in her 79s  . Colon cancer Neg Hx   . Breast cancer Neg Hx   . Diabetes Neg Hx     Review of Systems: Constitutional: weight loss, fatigue Eyes: Denies blurriness of vision Ears, nose, mouth, throat, and face: Denies mucositis or sore throat Respiratory: Denies cough, dyspnea or wheezes Cardiovascular: Denies palpitation, chest discomfort or lower extremity swelling Gastrointestinal:  Denies nausea, constipation, diarrhea GU: Denies dysuria or incontinence Skin: Denies abnormal skin rashes Neurological: Per HPI Musculoskeletal: Denies joint pain, back or neck discomfort. No decrease in ROM Behavioral/Psych: Denies anxiety, disturbance in thought content, and mood  instability  Physical Exam: Vitals:   08/05/18 1133  BP: (!) 146/70  Pulse: 89  Resp: 18  Temp: 97.9 F (36.6 C)  SpO2: 99%   ECOG 1 KPS: 90. General: Alert, cooperative, pleasant, in no acute distress Head: Craniotomy scar noted, dry and intact. EENT: No conjunctival injection or scleral icterus. Oral mucosa moist Lungs: Resp effort normal Cardiac: Regular rate and rhythm Abdomen: Soft, non-distended abdomen Skin: No rashes cyanosis or petechiae. Extremities: No clubbing or edema  Neurologic Exam: Mental Status: Awake, alert, attentive to examiner. Oriented to self and environment. Language is fluent with intact comprehension.  Cranial Nerves: Right eye modest impairment of acuity. Visual fields are full. Extra-ocular movements intact. R eye ptosis. Face is symmetric, tongue midline. Motor: Tone and bulk are normal. Power is full in both arms and legs. Reflexes are symmetric, no pathologic reflexes present. Intact finger to nose bilaterally Sensory: Intact to light touch and temperature Gait: Independent  Labs: I have reviewed the data as listed    Component Value Date/Time   NA 142 07/29/2018 1017   K 3.8 07/29/2018 1017   CL 102 07/29/2018 1017   CO2 28 07/29/2018 1017   GLUCOSE 119 (H) 07/29/2018 1017   BUN 17 07/29/2018 1017   CREATININE 0.77 07/29/2018 1017   CALCIUM 9.6 07/29/2018 1017   PROT 7.2 07/29/2018 1017   ALBUMIN 3.9 07/29/2018 1017   AST 12 (L) 07/29/2018 1017   ALT <6 07/29/2018 1017   ALKPHOS 42 07/29/2018 1017   BILITOT 0.4 07/29/2018 1017   GFRNONAA >60 07/29/2018 1017   GFRAA >60 07/29/2018 1017   Lab Results  Component Value Date   WBC 3.7 (L) 07/29/2018   NEUTROABS 3.0 07/29/2018   HGB 11.4 (L) 07/29/2018   HCT 36.8 07/29/2018   MCV 96.6 07/29/2018   PLT 158 07/29/2018     Assessment/Plan Glioblastoma with isocitrate dehydrogenase gene wildtype ([place])   Ms. Pavia demonstrates modest clinical improvement today.  We recommended  she decrease decadron to 77m daily, and initiate therapy with OTC multivitamin for Vitamin D supplementation.  We recommended resuming treatment with  cycle #2 of Temozolomide 150 mg/m2, on for five days and off for twenty three days in twenty eight day cycles. The patient will have a complete blood count performed on days 21 and 28 of each cycle, and a comprehensive metabolic panel performed on day 28 of each cycle. Labs may need to be performed more often. Zofran will prescribed for home use for nausea/vomiting.   Chemotherapy should be held for the following:  ANC less than 1,000  Platelets less than 100,000  LFT or creatinine greater than 2x ULN  If clinical concerns/contraindications develop  We appreciate the opportunity to participate in the care of [facility].   She should return to clinic in 1 month with an MRI brain for review.  All questions were answered. The patient knows to call the clinic with any problems, questions or concerns. No barriers to learning were detected.  The total time spent in the encounter was 25 minutes and more than 50% was on counseling and review of test results   Ventura Sellers, MD Medical Director of Neuro-Oncology [facility] [place] 08/05/18 11:32 AM

## 2018-08-05 NOTE — Progress Notes (Signed)
[id number] I met with patient and spouse, along with their daughter and granddaughter, today in clinic. Spouse called to inform me that they were seeing Dr. Mickeal Skinner today and that he had February's medication diary with him. I collected patient's February 2020 medication diary and received, via mail, today as well, patient's January 2020 medication diary. Patient reports no missed doses and documents self-administration of 4 capsules daily. Patient's B/P was assessed today, per standard clinic visit, and at 146/70.  Per Dr. Mickeal Skinner, patient to drop decadron dose to 2 mg daily and patient will start this dose tomorrow, since patient reports to have taken the 4 mg dose today already. Dr. Mauri Reading also having patient to start an OTC vitamin D supplement. Patient will start C2 temodar this week, pending prescription refill in the next couple of days. All patient and family's questions answered to their satisfaction, patient thanked for her continued support of study and was encouraged to call with questions.  Zofran also reordered, by MD, for patient to take PRN for nausea, patient reports to have not picked up original prescription. Maxwell Marion, RN, BSN, [facility] Clinical Research 08/05/2018 1:04 PM

## 2018-08-06 DIAGNOSIS — Z79891 Long term (current) use of opiate analgesic: Secondary | ICD-10-CM | POA: Diagnosis not present

## 2018-08-06 DIAGNOSIS — Z7982 Long term (current) use of aspirin: Secondary | ICD-10-CM | POA: Diagnosis not present

## 2018-08-06 DIAGNOSIS — C712 Malignant neoplasm of temporal lobe: Secondary | ICD-10-CM | POA: Diagnosis not present

## 2018-08-07 ENCOUNTER — Telehealth: Payer: Self-pay

## 2018-08-07 MED FILL — TEMOZOLOMIDE 20 MG CAPS: 20 | 2 days supply | Qty: 2 | Fill #0

## 2018-08-07 MED FILL — TEMOZOLOMIDE 100 MG CAPS: 100 | 2 days supply | Qty: 4 | Fill #0

## 2018-08-07 NOTE — Telephone Encounter (Signed)
Received call from patient spouse stating that they received the Temodar and wanted to know when they should start it. Explained that per Dr. Mickeal Skinner they can start it tonight. He verbalized understanding and had no other questions or concerns.

## 2018-08-10 ENCOUNTER — Other Ambulatory Visit: Payer: Self-pay | Admitting: Internal Medicine

## 2018-08-13 DIAGNOSIS — Z79891 Long term (current) use of opiate analgesic: Secondary | ICD-10-CM | POA: Diagnosis not present

## 2018-08-13 DIAGNOSIS — Z7982 Long term (current) use of aspirin: Secondary | ICD-10-CM | POA: Diagnosis not present

## 2018-08-13 DIAGNOSIS — C712 Malignant neoplasm of temporal lobe: Secondary | ICD-10-CM | POA: Diagnosis not present

## 2018-08-14 DIAGNOSIS — Z7982 Long term (current) use of aspirin: Secondary | ICD-10-CM | POA: Diagnosis not present

## 2018-08-14 DIAGNOSIS — C712 Malignant neoplasm of temporal lobe: Secondary | ICD-10-CM | POA: Diagnosis not present

## 2018-08-14 DIAGNOSIS — Z79891 Long term (current) use of opiate analgesic: Secondary | ICD-10-CM | POA: Diagnosis not present

## 2018-08-22 ENCOUNTER — Telehealth: Payer: Self-pay | Admitting: Medical Oncology

## 2018-08-22 DIAGNOSIS — Z7982 Long term (current) use of aspirin: Secondary | ICD-10-CM | POA: Diagnosis not present

## 2018-08-22 DIAGNOSIS — Z79891 Long term (current) use of opiate analgesic: Secondary | ICD-10-CM | POA: Diagnosis not present

## 2018-08-22 DIAGNOSIS — C712 Malignant neoplasm of temporal lobe: Secondary | ICD-10-CM | POA: Diagnosis not present

## 2018-08-22 NOTE — Telephone Encounter (Signed)
VU-0233: 3 month Post Radiation Therapy Ramipril Assessment Call to patient and spoke with Mr. Romagnoli. Spouse states that patient was currently with in-home PT.  Spouse confirms patient to be doing well and states that she is not having any issues or concerns. Spouse reports patient started Temodar on the 3/4, and has completed her 5 day dose. Spouse also reports that patient's "memory is sharp as a tack."  Ramipril: Per spouse, confirms that patient continues to be taking 4 tabs, before bedtime, of Ramipril with no missed doses for this past month, denies having any issues with self-administration and documentation. Medication Diaries: Patient medication diaries received for February at last clinic visit. March is currently in process and patient/spouse know to mail them in, with previously provided, postage-paid envelopes.  Concomitant Medications: Reviewed with spouse. Patient with no new medications. Patient currently taking decadron 2 mg daily (started on 3/3). Spouse also reports patient has not needed to take Zofran prescription due to not having any nausea.  Adverse Events: Reports no new events. Plan: Patient is scheduled for clinic visit with Dr. Mickeal Skinner on April 6th. I informed spouse that patient will run out of study drug Ramipril on 09/17/18 and we will schedule a clinic visit for the 16-month post RT completion visit, which includes the neurocognitive and questionnaire assessments. All spouses questions answered to his satisfaction. Patient and spouse thanked for their time and continued support of study and were encouraged to call Dr. Mauri Reading or myself with questions or concerns.  Maxwell Marion, RN, BSN, [facility] Clinical Research 08/22/2018 3:53 PM

## 2018-08-27 NOTE — Progress Notes (Signed)
Marie Haynes [id number] 05/09/1932  SIMULATION AND TREATMENT PLANNING NOTE, SPECIAL TREATMENT PROCEDURE  OUTPATIENT  DIAGNOSIS:    ICD-10-CM   1. Cancer of temporal lobe ([place]) C71.2     NARRATIVE:  The patient was brought to the [facility].  Identity was confirmed.  All relevant records and images related to the planned course of therapy were reviewed.  The patient freely provided informed written consent to proceed with treatment after reviewing the details related to the planned course of therapy. The consent form was witnessed and verified by the simulation staff.    Then, the patient was set-up in a stable reproducible supine position for radiation therapy. Aquaplast head mask was made for immobilization.  CT images were obtained.  Surface markings were placed.  The CT images were loaded into the planning software.    TREATMENT PLANNING NOTE: Treatment planning then occurred.  The radiation prescription was entered and confirmed.    A total of 1 medically necessary complex treatment devices were fabricated and supervised by me, in the form of Aquaplast mask.  MULTIPLE FIELDS WITH MLCs MAY DESIGNED IN DOSIMETRY for dose homogeneity.  I have requested : Intensity Modulated Radiotherapy (IMRT) is medically necessary for this case for the following reason:  Critical CNS structure avoidance - brainstem, optic chiasm, optic nerves,  brain.  The patient will receive 46 Gy in 23 fractions initially to initial brain volume, followed by a boost of 14 Gy in 7 fractions.  Special Treatment Procedure Note: The patient will be receiving chemotherapy concurrently. Chemotherapy heightens the risk of side effects. I have considered this during the patient's treatment planning process and will monitor the patient accordingly for side effects on a weekly basis. Concurrent chemotherapy increases the complexity of this patient's treatment and therefore this constitutes a special treatment  procedure.  -----------------------------------  Eppie Gibson, MD

## 2018-08-29 ENCOUNTER — Ambulatory Visit: Payer: Medicare Other | Admitting: *Deleted

## 2018-09-04 ENCOUNTER — Other Ambulatory Visit: Payer: Self-pay | Admitting: Internal Medicine

## 2018-09-06 ENCOUNTER — Other Ambulatory Visit: Payer: Self-pay

## 2018-09-06 ENCOUNTER — Ambulatory Visit (HOSPITAL_COMMUNITY)
Admission: RE | Admit: 2018-09-06 | Discharge: 2018-09-06 | Disposition: A | Discharge status: Home / Self Care | Payer: Medicare Other | Source: Ambulatory Visit | Attending: Internal Medicine | Admitting: Internal Medicine

## 2018-09-06 ENCOUNTER — Telehealth: Payer: Self-pay | Admitting: Medical Oncology

## 2018-09-06 DIAGNOSIS — C712 Malignant neoplasm of temporal lobe: Secondary | ICD-10-CM | POA: Diagnosis not present

## 2018-09-06 DIAGNOSIS — C719 Malignant neoplasm of brain, unspecified: Secondary | ICD-10-CM | POA: Diagnosis not present

## 2018-09-06 MED ORDER — GADOBUTROL 1 MMOL/ML IV SOLN
5.0000 mL | Freq: Once | INTRAVENOUS | Status: AC | PRN
  Administered 2018-09-06: 16:00:00 5 mL via INTRAVENOUS

## 2018-09-06 NOTE — Telephone Encounter (Signed)
[id number] (Ramipirl) LVMOM with spouse regarding patient's appointment with Dr. Mickeal Skinner this Monday the 6th. I left a detailed message regarding the study assessments and collection of study dispensed Ramipril bottles, including any medication that may be remaining, and medication diaries. Informed spouse that should patient not feel comfortable meeting at this time, I would understand, but I would still need to collect remaining medication and dispensed bottles. Spouse was informed that another research nurse will complete this visit on Monday. Asked spouse to return call, call back information provided. Spouse thanked. I confirmed with Dr. Mickeal Skinner that research could complete this visit with patient. Maxwell Marion, RN, BSN, [place] Clinical Research 09/06/2018 1:31 PM  2:45 PM Call to spouse again to see if I can catch him prior to patient's MRI appointment at 3:30 today. I was able to speak with him this time, he stated he had not heard my VM.  I gave spouse detailed information, as was left on his VM, regarding Monday's visit and the collection of study medication, medication diaries and neurocognitive assessments and the rationale for the early visit. Spouse gave verbal understanding. I informed spouse also that should patient feel uncomfortable with completing the neurocognitive and questionnaire assessment, we understand and will not have her complete at this time. Spouse gave his understanding to this as well. Spouse confirmed that Mrs. Puskas will bring study dispensed medication bottles as well as any remaining pills and the medication diaries on Monday. I informed spouse that research nurse Johney Maine will be completing this visit and the reason why and he gave his understanding. I also informed spouse that I will call them to review her medications and any adverse events later that Monday afternoon. Spouse has concern that he isn't being allowed to be with patient during visit and she will need  wheelchair assistance, I informed him that we will make sure that she is supported with assistance when here in clinic, he gave thanks. All his questions answered to his satisfaction and denies further questions at this time. Spouse thanked for his time and was encouraged to call Dr. Mickeal Skinner or myself with any questions or concerns they may have.  Maxwell Marion, RN, BSN, [facility] Clinical Research 09/06/2018 3:05 PM

## 2018-09-09 ENCOUNTER — Inpatient Hospital Stay: Payer: Medicare Other

## 2018-09-09 ENCOUNTER — Encounter: Payer: Self-pay | Admitting: Internal Medicine

## 2018-09-09 ENCOUNTER — Inpatient Hospital Stay: Payer: Medicare Other | Attending: Internal Medicine | Admitting: Internal Medicine

## 2018-09-09 ENCOUNTER — Telehealth: Payer: Self-pay | Admitting: Medical Oncology

## 2018-09-09 ENCOUNTER — Other Ambulatory Visit: Payer: Self-pay

## 2018-09-09 ENCOUNTER — Inpatient Hospital Stay: Payer: Medicare Other | Admitting: Medical Oncology

## 2018-09-09 VITALS — BP 100/61 | HR 68 | Temp 98.3°F | Resp 18 | Ht 63.0 in | Wt 109.7 lb

## 2018-09-09 DIAGNOSIS — R2681 Unsteadiness on feet: Secondary | ICD-10-CM | POA: Diagnosis not present

## 2018-09-09 DIAGNOSIS — M4800 Spinal stenosis, site unspecified: Secondary | ICD-10-CM | POA: Diagnosis not present

## 2018-09-09 DIAGNOSIS — K5900 Constipation, unspecified: Secondary | ICD-10-CM | POA: Diagnosis not present

## 2018-09-09 DIAGNOSIS — C719 Malignant neoplasm of brain, unspecified: Secondary | ICD-10-CM

## 2018-09-09 DIAGNOSIS — M1990 Unspecified osteoarthritis, unspecified site: Secondary | ICD-10-CM | POA: Insufficient documentation

## 2018-09-09 DIAGNOSIS — M810 Age-related osteoporosis without current pathological fracture: Secondary | ICD-10-CM | POA: Diagnosis not present

## 2018-09-09 DIAGNOSIS — D649 Anemia, unspecified: Secondary | ICD-10-CM | POA: Diagnosis not present

## 2018-09-09 DIAGNOSIS — Z7982 Long term (current) use of aspirin: Secondary | ICD-10-CM | POA: Diagnosis not present

## 2018-09-09 DIAGNOSIS — E039 Hypothyroidism, unspecified: Secondary | ICD-10-CM | POA: Diagnosis not present

## 2018-09-09 DIAGNOSIS — N3941 Urge incontinence: Secondary | ICD-10-CM | POA: Insufficient documentation

## 2018-09-09 DIAGNOSIS — F329 Major depressive disorder, single episode, unspecified: Secondary | ICD-10-CM | POA: Insufficient documentation

## 2018-09-09 DIAGNOSIS — R634 Abnormal weight loss: Secondary | ICD-10-CM | POA: Insufficient documentation

## 2018-09-09 DIAGNOSIS — Z79899 Other long term (current) drug therapy: Secondary | ICD-10-CM | POA: Insufficient documentation

## 2018-09-09 DIAGNOSIS — Z923 Personal history of irradiation: Secondary | ICD-10-CM | POA: Insufficient documentation

## 2018-09-09 LAB — CBC WITH DIFFERENTIAL (CANCER CENTER ONLY)
Abs Immature Granulocytes: 0.01 10*3/uL (ref 0.00–0.07)
Basophils Absolute: 0 10*3/uL (ref 0.0–0.1)
Basophils Relative: 0 %
Eosinophils Absolute: 0 10*3/uL (ref 0.0–0.5)
Eosinophils Relative: 1 %
HCT: 29.6 % — ABNORMAL LOW (ref 36.0–46.0)
Hemoglobin: 9.4 g/dL — ABNORMAL LOW (ref 12.0–15.0)
Immature Granulocytes: 0 %
Lymphocytes Relative: 6 %
Lymphs Abs: 0.2 10*3/uL — ABNORMAL LOW (ref 0.7–4.0)
MCH: 32.8 pg (ref 26.0–34.0)
MCHC: 31.8 g/dL (ref 30.0–36.0)
MCV: 103.1 fL — ABNORMAL HIGH (ref 80.0–100.0)
Monocytes Absolute: 0.3 10*3/uL (ref 0.1–1.0)
Monocytes Relative: 9 %
Neutro Abs: 2.6 10*3/uL (ref 1.7–7.7)
Neutrophils Relative %: 84 %
Platelet Count: 49 10*3/uL — ABNORMAL LOW (ref 150–400)
RBC: 2.87 MIL/uL — ABNORMAL LOW (ref 3.87–5.11)
RDW: 16.1 % — ABNORMAL HIGH (ref 11.5–15.5)
WBC Count: 3.1 10*3/uL — ABNORMAL LOW (ref 4.0–10.5)
nRBC: 0 % (ref 0.0–0.2)

## 2018-09-09 LAB — CMP (CANCER CENTER ONLY)
ALT: 15 U/L (ref 0–44)
AST: 13 U/L — ABNORMAL LOW (ref 15–41)
Albumin: 3.4 g/dL — ABNORMAL LOW (ref 3.5–5.0)
Alkaline Phosphatase: 29 U/L — ABNORMAL LOW (ref 38–126)
Anion gap: 9 (ref 5–15)
BUN: 17 mg/dL (ref 8–23)
CO2: 28 mmol/L (ref 22–32)
Calcium: 9 mg/dL (ref 8.9–10.3)
Chloride: 103 mmol/L (ref 98–111)
Creatinine: 0.7 mg/dL (ref 0.44–1.00)
GFR, Est AFR Am: >60 mL/min (ref >60)
GFR, Estimated: >60 mL/min (ref >60)
Glucose, Bld: 102 mg/dL — ABNORMAL HIGH (ref 70–99)
Potassium: 3.9 mmol/L (ref 3.5–5.1)
Sodium: 140 mmol/L (ref 135–145)
Total Bilirubin: 0.3 mg/dL (ref 0.3–1.2)
Total Protein: 6.1 g/dL — ABNORMAL LOW (ref 6.5–8.1)

## 2018-09-09 NOTE — Research (Signed)
WF 1801 (Ramipril): 4 Month Post RT Ramipril Assessment This RN met with patient Marie Haynes following her routine appointment with Dr. Mickeal Skinner.  She reports that she is doing "well enough." Ramipril: Patient returned three medication bottles today, with remaining pills.  The patient reports she has missed no doses of medication.  The medication bottles and remaining pills were returned to Raul Del, PharmD for counting.  32 pills remained.  Medication bottles were all Rx # [id number]. Medication Diaries:  The patient also returns her signed mediation diaries for both March and April 2020. Concomitant Medications:  Medications are planned to be reviewed over the phone with Research RN Otilio Miu.   Neurocognitive Testing and PROs:  Neurocognitive testing was unable to be completed today, as certified staff were not able to arrive on site as expected due to COVD-19 restrictions.  This RN did complete the PROs with the patient.  The patient reports that reading is difficult for her due to her ongoing blurred vision and she requested this RN read the questions to her.  This RN read the questions to her, as written, and recorded the patient's answers.  Adverse Events:  Patient reports no new events, and confirms ongoing nausea, intermittent, left hip pain, and lower back pain without change. Plan:  Patient has completed dosing of the investigational Ramipril.  She was thanked for her ongoing participation in the WF1801 study.  She will be contacted by the Research team at a later date to schedule future study visits.  Doreatha Martin, RN, BSN, [facility] 09/09/2018 1:29 PM

## 2018-09-09 NOTE — Telephone Encounter (Signed)
[id number] LVMOM with patient's spouse. Following up with patient today regarding her appointment with MD and visit with research nurse Doreatha Martin. Informed spouse, I was calling to review Mrs. Vastine's medication list as related to study and updating information. LM with spouse informing him that I will call back in morning. Spouse thanked and was encouraged to call with questions.  Maxwell Marion, RN, BSN, [facility] Clinical Research 09/09/2018 3:57 PM

## 2018-09-09 NOTE — Progress Notes (Signed)
[facility] at [place], [place] [address] [phone]   Interval Evaluation  Date of Service: 09/09/18 Patient Name: Marie Haynes Patient MRN: [id number] Patient DOB: 08-23-1931 Provider: Ventura Sellers, MD  Identifying Statement:  Marie Haynes is a 85 y.o. 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 Christella Noa     - 05/28/2018 Radiation Therapy    Completes IMRT with concurrent Temodar     Biomarkers:  MGMT Unknown.  IDH 1/2 Wild type.  EGFR Unknown  TERT Unknown   Interval History:  [name] presents for follow up today following cycle #2 of temodar.  She continues to describe fatigue and poor energy noted over the past several weeks.  She doesn't feel like it is significantly worse.  No issues with the temodar.  Right eye is still blurry as prior.  No seizures, and no return of chest pains.  H+P (04/05/18) Patient presented to medical attention several weeks ago with new onset severe headache, which was a new complaint for her.  CT demonstrated a brain mass, and follow up MRI confirmed likely neoplasm.  After review by neurosurgery, she underwent craniotomy and resection of the mass with Dr. Christella Noa on 03/20/18.  She tolerated surgery well without any complaints or post-op deficits.  Since surgery she has been at home recovering, no recurrence of headaches.  One day last week she woke up "very confused" and unable to use left hand/arm in particular.  This gradually improved during the day until she was back at baseline by the evening; this "spell" has not recurred since that time.  She is currently off Keppra and did feel "drowsy" while on 549m twice per day.  She presents today to review pathology, prognosis, treatment options moving forward.  Medications: Current Outpatient  Medications on File Prior to Visit  Medication Sig Dispense Refill   aspirin EC 81 MG tablet Take 81 mg by mouth daily.     Calcium Carbonate-Vitamin D (CALCIUM + D PO) Take 3 tablets by mouth 2 (two) times daily.      dexamethasone (DECADRON) 4 MG tablet Take 1 tablet (4 mg total) by mouth 2 (two) times daily. 30 tablet 0   docusate sodium (COLACE) 100 MG capsule Take 100 mg by mouth 2 (two) times daily as needed for mild constipation.     HYDROcodone-acetaminophen (NORCO/VICODIN) 5-325 MG tablet Take 1 tablet by mouth every 4 (four) hours as needed. (Patient not taking: Reported on 07/29/2018) 12 tablet 0   HYDROmorphone (DILAUDID) 2 MG tablet Take 1 tablet (2 mg total) by mouth every 4 (four) hours as needed for severe pain. (Patient not taking: Reported on 07/29/2018) 30 tablet 0   Investigational Ramipril 1.25 MG capsule [id number] Take 4 capsules (5 mg total) by mouth at bedtime. Take at bedtime with or without food. Drink adequate water to avoid dehydration. Avoid salt substitutes that are high in potassium. 224 capsule 0   levETIRAcetam (KEPPRA) 250 MG tablet TAKE ONE TABLET BY MOUTH TWICE A DAY 60 tablet 2   levothyroxine (SYNTHROID, LEVOTHROID) 75 MCG tablet Take 1 tablet (75 mcg total) by mouth daily before breakfast. 30 tablet 1   ondansetron (ZOFRAN) 8 MG tablet Take 1 tablet (8 mg total) by mouth 2 (two) times daily as needed (nausea and vomiting). Take 30-60 minutes prior  to Temodar 30 tablet 1   temozolomide (TEMODAR) 100 MG capsule Take 141m cap x2 + 244mcap (2203motal dose) once daily for 5 days on, 23d off. Take on empty stomach & at bedtime to reduce N/V (Patient not taking: Reported on 07/05/2018) 10 capsule 0   temozolomide (TEMODAR) 100 MG capsule Take 100m22mp x2 + 20mg26m (220mg 108ml dose) once daily for 5 days on, 23d off. Take on empty stomach & at bedtime to reduce N/V 10 capsule 0   temozolomide (TEMODAR) 20 MG capsule Take 20mg c20m 100mg ca50m (220mg tot65mdose) once daily for 5 days on, 23d off. Take on empty stomach & at bedtime to reduce N/V 5 capsule 0   temozolomide (TEMODAR) 20 MG capsule Take 20mg cap 28m0mg cap x1m20mg total 37m) once daily for 5 days on, 23d off. Take on empty stomach & at bedtime to reduce N/V 5 capsule 0   No current facility-administered medications on file prior to visit.     Allergies:  Allergies  Allergen Reactions   Oxycodone-Acetaminophen Anaphylaxis and Nausea And Vomiting   Codeine Other (See Comments)    constipation   Past Medical History:  Past Medical History:  Diagnosis Date   Anxiety and depression    Constipation    DJD (degenerative joint disease)    Family history of adverse reaction to anesthesia    Mother - "shock" - due to anesthesia-    History of radiation therapy 04/15/18- 05/27/18   Right frontal lobe tumor bed initial 46 Gy in 23 fractions, Right frontal lobe tumor bed boost 14 Gy in 7 fractions.    Hypothyroidism    Increased urinary frequency    Dr. MacDiarmid  Matilde SprangS, NOCTURNAL 02/22/2007   Qualifier: Diagnosis of  By: Paz MD, JoseLarose Kells    OJenisonosis    Spinal stenosis    Stress incontinence    (saw Urology 5-09)     Symptomatic cholelithiasis 2017   reluctant to have surgery   Urge and stress incontinence    Dr. MacDiarmid  Matilde Sprangical History:  Past Surgical History:  Procedure Laterality Date   APPLICATION OF CRANIAL NAVIGATION N/A 03/20/2018   Procedure: APPLICATION OF CRANIAL NAVIGATION;  Surgeon: Cabbell, KylAshok Pallion: [facility];  [facility];  Laterality: N/A;   BACK SURGERY  [id number]   [id number]     CRANIOTOMY Right 03/20/2018   Procedure: Right Temporal craniotomy for tumor resection with brainlab;  Surgeon: Cabbell, KylAshok Pallion: [facility];  [facility];  Laterality: Right;  Right Temporal craniotomy for tumor resection with brainlab   DILATION AND CURETTAGE OF UTERUS     EYE SURGERY Bilateral     cataract   Social History:  Social History   Socioeconomic History   Marital status: Married    Spouse name: Not on file   Number of children: 3   Years of education: Not on file   Highest education level: Not on file  Occupational History   Occupation: retired english teacPensions consultantocial Needs   Financial resource strain: Not on file   Food insecurity:    Worry: Not on file    Inability: Not on file   Transportation needs:    Medical: No    Non-medical: No  Tobacco Use   Smoking status: Never Smoker   Smokeless tobacco: Never Used  Substance and Sexual Activity   Alcohol  use: Yes    Comment: Rare   Drug use: No   Sexual activity: Not on file  Lifestyle   Physical activity:    Days per week: Not on file    Minutes per session: Not on file   Stress: Not on file  Relationships   Social connections:    Talks on phone: Not on file    Gets together: Not on file    Attends religious service: Not on file    Active member of club or organization: Not on file    Attends meetings of clubs or organizations: Not on file    Relationship status: Not on file   Intimate partner violence:    Fear of current or ex partner: No    Emotionally abused: No    Physically abused: No    Forced sexual activity: No  Other Topics Concern   Not on file  Social History Narrative   Household- pt and husband   Daughter bipolar   Family History:  Family History  Problem Relation Age of Onset   Heart failure Mother    Heart attack Mother 60       dx in her 26s   Colon cancer Neg Hx    Breast cancer Neg Hx    Diabetes Neg Hx     Review of Systems: Constitutional: weight loss, fatigue Eyes: Denies blurriness of vision Ears, nose, mouth, throat, and face: Denies mucositis or sore throat Respiratory: Denies cough, dyspnea or wheezes Cardiovascular: Denies palpitation, chest discomfort or lower extremity swelling Gastrointestinal:  Denies  nausea, constipation, diarrhea GU: Denies dysuria or incontinence Skin: Denies abnormal skin rashes Neurological: Per HPI Musculoskeletal: Denies joint pain, back or neck discomfort. No decrease in ROM Behavioral/Psych: Denies anxiety, disturbance in thought content, and mood instability  Physical Exam: Vitals:   09/09/18 1136  BP: 100/61  Pulse: 68  Resp: 18  Temp: 98.3 F (36.8 C)  SpO2: 98%   ECOG 1 KPS: 90. General: Alert, cooperative, pleasant, in no acute distress Head: Craniotomy scar noted, dry and intact. EENT: No conjunctival injection or scleral icterus. Oral mucosa moist Lungs: Resp effort normal Cardiac: Regular rate and rhythm Abdomen: Soft, non-distended abdomen Skin: No rashes cyanosis or petechiae. Extremities: No clubbing or edema  Neurologic Exam: Mental Status: Awake, alert, attentive to examiner. Oriented to self and environment. Language is fluent with intact comprehension.  Cranial Nerves: Right eye modest impairment of acuity. Visual fields are full. Extra-ocular movements intact. R eye ptosis. Face is symmetric, tongue midline. Motor: Tone and bulk are normal. Power is full in both arms and legs. Reflexes are symmetric, no pathologic reflexes present. Intact finger to nose bilaterally Sensory: Intact to light touch and temperature Gait: Independent  Labs: I have reviewed the data as listed    Component Value Date/Time   NA 140 09/09/2018 1114   K 3.9 09/09/2018 1114   CL 103 09/09/2018 1114   CO2 28 09/09/2018 1114   GLUCOSE 102 (H) 09/09/2018 1114   BUN 17 09/09/2018 1114   CREATININE 0.70 09/09/2018 1114   CALCIUM 9.0 09/09/2018 1114   PROT 6.1 (L) 09/09/2018 1114   ALBUMIN 3.4 (L) 09/09/2018 1114   AST 13 (L) 09/09/2018 1114   ALT 15 09/09/2018 1114   ALKPHOS 29 (L) 09/09/2018 1114   BILITOT 0.3 09/09/2018 1114   GFRNONAA >60 09/09/2018 1114   GFRAA >60 09/09/2018 1114   Lab Results  Component Value Date   WBC 3.1 (L) 09/09/2018  NEUTROABS 2.6 09/09/2018   HGB 9.4 (L) 09/09/2018   HCT 29.6 (L) 09/09/2018   MCV 103.1 (H) 09/09/2018   PLT 49 (L) 09/09/2018    Imaging:  [facility] Clinician Interpretation: I have personally reviewed the CNS images as listed.  My interpretation, in the context of the patient's clinical presentation, is treatment effect vs true progression  Mr [name] Wo Contrast  Result Date: 09/06/2018 CLINICAL DATA:  Followup right temporal glioblastoma with resection 03/20/2018 EXAM: MRI HEAD WITHOUT AND WITH CONTRAST TECHNIQUE: Multiplanar, multiecho pulse sequences of the brain and surrounding structures were obtained without and with intravenous contrast. CONTRAST:  5 cc Gadavist COMPARISON:  06/21/2018.  03/21/2018. FINDINGS: Brain: Very slight reduction in size of the post resection cavity in the right lateral temporal lobe and temporal tip region. Marginal enhancement appears very similar, with the single exception of some increased enhancement along the medial margin other resection, just superior to the temporal horn. This is marked with arrows on the axial and coronal imaging. Otherwise, there is newly increased T2 white matter signal in the right hemisphere superiorly, presumed secondary to radiation. This would seem unlikely to relate 2 tumor progression. Ventricular size is stable. No other change since the previous study. Vascular: Major vessels at the base of the brain show flow. Skull and upper cervical spine: Otherwise negative Sinuses/Orbits: Clear sinuses. Orbits negative. Mastoid effusion on the right as seen previously. Other: None IMPRESSION: Slight reduction in size of the post resection cavity in the lateral temporal region and temporal tip region on the right. Slight increase in enhancement within the brain parenchyma medial to the resection cavity, extending just superior to the temporal horn of the right lateral ventricle, marked on the axial and coronal imaging with arrows. Increased T2 signal  higher within the right hemisphere presumed secondary to treatment. Electronically Signed   By: Nelson Chimes M.D.   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 Lewellen is clinically stable today.  Her MRI demonstrates changes consistent with either tumor progression or radiation treatment effect.  Notably, new region of contrast enhancement is within region of T2 abnormality visualized on prior scan.  There are no T2 changes seen today and no increased mass effect.  Unfortunately she has thrombocytopenia and anemia today.    Due to bone marrow toxicity of Temodar and ongoing Covid-19 pandemic with elevated risk (age, cancer, chemo), we recommended holding chemotherapy for 4 weeks.   She should continue decadron at 49m daily and Keppra 2553mBID, as prior.  We appreciate the opportunity to [facility]  She should return to clinic in 4 weeks with repeat labs for evaluation.  All questions were answered. The patient knows to call the clinic with any problems, questions or concerns. No barriers to learning were detected.  The total time spent in the encounter was 25 minutes and more than 50% was on counseling and review of test results   ZaVentura SellersMD Medical Director of Neuro-Oncology [facility]4/06/20 11:28 AM

## 2018-09-10 ENCOUNTER — Telehealth: Payer: Self-pay | Admitting: Internal Medicine

## 2018-09-10 ENCOUNTER — Other Ambulatory Visit: Payer: Self-pay | Admitting: Pharmacist

## 2018-09-10 NOTE — Telephone Encounter (Signed)
[place] appt per 4/6 los.  Patient aware of appt date and time.

## 2018-09-11 ENCOUNTER — Encounter: Payer: Self-pay | Admitting: Medical Oncology

## 2018-09-11 DIAGNOSIS — C719 Malignant neoplasm of brain, unspecified: Secondary | ICD-10-CM

## 2018-09-11 NOTE — Progress Notes (Signed)
[id number] Ramipril :  Call to patient and spouse regarding collection of concomitant medications and adverse events. I spoke with spouse and patient via phone today, since I was unable to be in clinic when patient was here for her scheduled MD visit. Patient is at the 4 month Post RT study assessment. Research nurse Doreatha Martin had collected patient's medication bottles as well as the medication diaries and had patient complete the PRO's, on the 6th of April and documented a research encounter for that day. Concomitant medications: reviewed and changes noted for documentation with study. Temodar is currently on hold due to low platelet count. Patient will return next month, May 4th, for labs, MD visit and assessment.  Adverse events: were reviewed by MD and Morene Crocker, and I confirmed with patient, over the phone, no new issues or concerns. Patient continues to report worsening arthritis with left hip (Gr1) and lower back pain (Gr1) and states OTC tylenol is helping with the pain/discomfort, anorexia (Gr 2), weight loss (Gr 1), increased fatigue (Gr2) and intermittent nausea (gr 1). Per study protocol, grade 1 and 2 expected/unexpected AE's do not need to be reported with study.  Neurocognitive: Patient had not completed the neurocogntive assessment, for this time point, when in clinic, due to there being no neurocognitive certified staff on site related to [facility] policies and procedures.  I informed patient that I will clarify with study if they wish for [place] to collect the assessment at a future visit and will follow-up with her.  Plan: All of patient and spouse's questions answered to their satisfaction. Both were thanked for their continued support of study and patient knows to expect call from me in one month's time for her last study assessment. Patient and spouse encouraged to call with any questions or concerns.  Maxwell Marion, RN, BSN, [facility] Clinical Research 09/11/2018 1:48 PM

## 2018-09-23 ENCOUNTER — Other Ambulatory Visit: Payer: Self-pay

## 2018-09-23 ENCOUNTER — Encounter: Payer: Self-pay | Admitting: Medical Oncology

## 2018-09-23 ENCOUNTER — Telehealth: Payer: Self-pay | Admitting: Medical Oncology

## 2018-09-23 ENCOUNTER — Telehealth: Payer: Self-pay | Admitting: Internal Medicine

## 2018-09-23 ENCOUNTER — Inpatient Hospital Stay (HOSPITAL_BASED_OUTPATIENT_CLINIC_OR_DEPARTMENT_OTHER): Payer: Medicare Other | Admitting: Internal Medicine

## 2018-09-23 VITALS — BP 123/60 | HR 76 | Temp 98.6°F | Resp 17 | Ht 63.0 in | Wt 112.5 lb

## 2018-09-23 DIAGNOSIS — M4800 Spinal stenosis, site unspecified: Secondary | ICD-10-CM

## 2018-09-23 DIAGNOSIS — D649 Anemia, unspecified: Secondary | ICD-10-CM | POA: Diagnosis not present

## 2018-09-23 DIAGNOSIS — Z7982 Long term (current) use of aspirin: Secondary | ICD-10-CM | POA: Diagnosis not present

## 2018-09-23 DIAGNOSIS — F329 Major depressive disorder, single episode, unspecified: Secondary | ICD-10-CM

## 2018-09-23 DIAGNOSIS — M1990 Unspecified osteoarthritis, unspecified site: Secondary | ICD-10-CM | POA: Diagnosis not present

## 2018-09-23 DIAGNOSIS — C719 Malignant neoplasm of brain, unspecified: Secondary | ICD-10-CM | POA: Diagnosis not present

## 2018-09-23 DIAGNOSIS — Z79899 Other long term (current) drug therapy: Secondary | ICD-10-CM

## 2018-09-23 DIAGNOSIS — E039 Hypothyroidism, unspecified: Secondary | ICD-10-CM

## 2018-09-23 DIAGNOSIS — N3941 Urge incontinence: Secondary | ICD-10-CM | POA: Diagnosis not present

## 2018-09-23 DIAGNOSIS — R634 Abnormal weight loss: Secondary | ICD-10-CM | POA: Diagnosis not present

## 2018-09-23 DIAGNOSIS — M810 Age-related osteoporosis without current pathological fracture: Secondary | ICD-10-CM | POA: Diagnosis not present

## 2018-09-23 DIAGNOSIS — K5900 Constipation, unspecified: Secondary | ICD-10-CM

## 2018-09-23 DIAGNOSIS — R2681 Unsteadiness on feet: Secondary | ICD-10-CM

## 2018-09-23 DIAGNOSIS — Z923 Personal history of irradiation: Secondary | ICD-10-CM

## 2018-09-23 MED ORDER — METHYLPHENIDATE HCL 5 MG PO TABS: 5.0000 mg | ORAL_TABLET | Freq: Two times a day (BID) | ORAL | 0 refills | Status: DC

## 2018-09-23 MED ORDER — DEXAMETHASONE 1 MG PO TABS: 1.0000 mg | ORAL_TABLET | Freq: Every day | ORAL | 3 refills | Status: AC

## 2018-09-23 NOTE — Research (Signed)
[id number]:  Research assistant, Farris Has met with patient today after patient's appointment with Dr Mickeal Skinner. Per Judeen Hammans, Dr. Mickeal Skinner was consulted with and  ok'd the 4 month post RT neurcognitive assessment to be completed with patient, and patient agreed, as well, to complete this assessment today.  Maxwell Marion, RN, BSN, [facility] Clinical Research 09/23/2018 4:15 PM

## 2018-09-23 NOTE — Telephone Encounter (Signed)
[id number] LVMOM with patient's spouse regarding patient coming into clinic for MD visit today. Informed spouse that if patient was feeling well enough to complete the 4 month post RT neurocognitive assessment that we were unable to complete at her last in clinic visit. Informed spouse that research assistant, Farris Has, will see Mrs. Guertin while patient is in clinic and inquire with her if could complete this final study assessment. Patient, spouse thanked for their time and continued support of study and were encouraged to call with questions. Return contact number provided.  Maxwell Marion, RN, BSN, [facility] Clinical Research 09/23/2018 10:13 AM

## 2018-09-23 NOTE — Progress Notes (Signed)
[place] at [place], [place] [address] [phone]   Interval Evaluation  Date of Service: 09/23/18 Patient Name: Marie Haynes Patient MRN: [id number] Patient DOB: [birth year] Provider: Ventura Sellers, MD  Identifying Statement:  Marie Haynes is a 85 y.o. female with right temporal glioblastoma   Oncologic History:   Glioblastoma with isocitrate dehydrogenase gene wildtype ([place])   03/20/2018 Surgery    Craniotomy, resection with Dr. Christella Noa     - 05/28/2018 Radiation Therapy    Completes IMRT with concurrent Temodar     Biomarkers:  MGMT Unknown.  IDH 1/2 Wild type.  EGFR Unknown  TERT Unknown   Interval History:  Marie Haynes presents for follow up today given progressive clinical changes.  She continues to describe progressive fatigue and poor energy as also recently noted.  At this time she is requiring more help with ADLs such as getting dressed, getting out of bed/chair.  Left foot has been "dragging" and gait has been more unsteady, requiring full use of walker. Right eye continues to be blurry and tearing, she has upcoming visit to ophthalmologist scheduled.  Otherwise no seizures or headaches.  H+P (04/05/18) Patient presented to medical attention several weeks ago with new onset severe headache, which was a new complaint for her.  CT demonstrated a brain mass, and follow up MRI confirmed likely neoplasm.  After review by neurosurgery, she underwent craniotomy and resection of the mass with Dr. Christella Noa on 03/20/18.  She tolerated surgery well without any complaints or post-op deficits.  Since surgery she has been at home recovering, no recurrence of headaches.  One day last week she woke up "very confused" and unable to use left hand/arm in particular.  This gradually improved during the day until she was back at baseline by the evening; this "spell" has not recurred since that time.  She is currently off  Keppra and did feel "drowsy" while on 522m twice per day.  She presents today to review pathology, prognosis, treatment options moving forward.  Medications: Current Outpatient Medications on File Prior to Visit  Medication Sig Dispense Refill   aspirin EC 81 MG tablet Take 81 mg by mouth daily.     Calcium Carbonate-Vitamin D (CALCIUM + D PO) Take 3 tablets by mouth 2 (two) times daily.      dexamethasone (DECADRON) 4 MG tablet Take 1 tablet (4 mg total) by mouth 2 (two) times daily. 30 tablet 0   docusate sodium (COLACE) 100 MG capsule Take 100 mg by mouth 2 (two) times daily as needed for mild constipation.     HYDROcodone-acetaminophen (NORCO/VICODIN) 5-325 MG tablet Take 1 tablet by mouth every 4 (four) hours as needed. (Patient not taking: Reported on 07/29/2018) 12 tablet 0   HYDROmorphone (DILAUDID) 2 MG tablet Take 1 tablet (2 mg total) by mouth every 4 (four) hours as needed for severe pain. (Patient not taking: Reported on 07/29/2018) 30 tablet 0   levETIRAcetam (KEPPRA) 250 MG tablet TAKE ONE TABLET BY MOUTH TWICE A DAY 60 tablet 2   levothyroxine (SYNTHROID, LEVOTHROID) 75 MCG tablet Take 1 tablet (75 mcg total) by mouth daily before breakfast. 30 tablet 1   ondansetron (ZOFRAN) 8 MG tablet Take 1 tablet (8 mg total) by mouth 2 (two) times daily as needed (nausea and vomiting). Take 30-60 minutes prior to Temodar 30 tablet 1   temozolomide (TEMODAR) 100 MG capsule Take 101mcap x2 + 2065map (  23m total dose) once daily for 5 days on, 23d off. Take on empty stomach & at bedtime to reduce N/V (Patient not taking: Reported on 09/09/2018) 10 capsule 0   temozolomide (TEMODAR) 20 MG capsule Take 291mcap + 10036map x2 (220m49mtal dose) once daily for 5 days on, 23d off. Take on empty stomach & at bedtime to reduce N/V (Patient not taking: Reported on 09/09/2018) 5 capsule 0   [DISCONTINUED] Investigational Ramipril 1.25 MG capsule WF-1801 Take 4 capsules (5 mg total) by mouth at  bedtime. Take at bedtime with or without food. Drink adequate water to avoid dehydration. Avoid salt substitutes that are high in potassium. (Patient not taking: Reported on 09/09/2018) 224 capsule 0   No current facility-administered medications on file prior to visit.     Allergies:  Allergies  Allergen Reactions   Oxycodone-Acetaminophen Anaphylaxis and Nausea And Vomiting   Codeine Other (See Comments)    constipation   Past Medical History:  Past Medical History:  Diagnosis Date   Anxiety and depression    Constipation    DJD (degenerative joint disease)    Family history of adverse reaction to anesthesia    Mother - "shock" - due to anesthesia-    History of radiation therapy 04/15/18- 05/27/18   Right frontal lobe tumor bed initial 46 Gy in 23 fractions, Right frontal lobe tumor bed boost 14 Gy in 7 fractions.    Hypothyroidism    Increased urinary frequency    Dr. MacDMatilde SprangEG CRAMPS, NOCTURNAL 02/22/2007   Qualifier: Diagnosis of  By: Paz Larose Kells JoseNedrowOsteoporosis    Spinal stenosis    Stress incontinence    (saw Urology 5-09)     Symptomatic cholelithiasis 2017   reluctant to have surgery   Urge and stress incontinence    Dr. MacDMatilde Sprangast Surgical History:  Past Surgical History:  Procedure Laterality Date   APPLICATION OF CRANIAL NAVIGATION N/A 03/20/2018   Procedure: APPLICATION OF CRANIAL NAVIGATION;  Surgeon: CabbAshok Pall;  [facility];  Laterality: N/A;   BACK SURGERY  [id number]     CRANIOTOMY Right 03/20/2018   Procedure: Right Temporal craniotomy for tumor resection with brainlab;  Surgeon: CabbAshok Pall;  [facility] Neurosurgery;  Laterality: Right;  Right Temporal craniotomy for tumor resection with brainlab   DILATION AND CURETTAGE OF UTERUS     EYE SURGERY Bilateral    cataract   Social History:  Social History   Socioeconomic History   Marital  status: Married    Spouse name: Not on file   Number of children: 3   Years of education: Not on file   Highest education level: Not on file  Occupational History   Occupation: retired englPensions consultantTIRED  Social NeedDesigner, fashion/clothingain: Not on file   Food insecurity:    Worry: Not on file    Inability: Not on file   Transportation needs:    Medical: No    Non-medical: No  Tobacco Use   Smoking status: Never Smoker   Smokeless tobacco: Never Used  Substance and Sexual Activity   Alcohol use: Yes    Comment: Rare   Drug use: No   Sexual activity: Not on file  Lifestyle   Physical activity:    Days per week: Not on file    Minutes per session:  Not on file   Stress: Not on file  Relationships   Social connections:    Talks on phone: Not on file    Gets together: Not on file    Attends religious service: Not on file    Active member of club or organization: Not on file    Attends meetings of clubs or organizations: Not on file    Relationship status: Not on file   Intimate partner violence:    Fear of current or ex partner: No    Emotionally abused: No    Physically abused: No    Forced sexual activity: No  Other Topics Concern   Not on file  Social History Narrative   Household- pt and husband   Daughter bipolar   Family History:  Family History  Problem Relation Age of Onset   Heart failure Mother    Heart attack Mother 61       dx in her 102s   Colon cancer Neg Hx    Breast cancer Neg Hx    Diabetes Neg Hx     Review of Systems: Constitutional: weight loss, fatigue Eyes: Denies blurriness of vision Ears, nose, mouth, throat, and face: Denies mucositis or sore throat Respiratory: Denies cough, dyspnea or wheezes Cardiovascular: Denies palpitation, chest discomfort or lower extremity swelling Gastrointestinal:  Denies nausea, constipation, diarrhea GU: Denies dysuria or incontinence Skin: Denies abnormal  skin rashes Neurological: Per HPI Musculoskeletal: Denies joint pain, back or neck discomfort. No decrease in ROM Behavioral/Psych: Denies anxiety, disturbance in thought content, and mood instability  Physical Exam: Vitals:   09/23/18 1117  BP: 123/60  Pulse: 76  Resp: 17  Temp: 98.6 F (37 C)  SpO2: 100%   ECOG 1 KPS: 90. General: Alert, cooperative, pleasant, in no acute distress Head: Craniotomy scar noted, dry and intact. EENT: No conjunctival injection or scleral icterus. Oral mucosa moist Lungs: Resp effort normal Cardiac: Regular rate and rhythm Abdomen: Soft, non-distended abdomen Skin: No rashes cyanosis or petechiae. Extremities: No clubbing or edema  Neurologic Exam: Mental Status: Awake, alert, attentive to examiner. Oriented to self and environment. Language is fluent with intact comprehension.  Cranial Nerves: Right eye modest impairment of acuity. Visual fields are full. Extra-ocular movements intact. R eye ptosis. Face is symmetric, tongue midline. Motor: Tone and bulk are normal. Power is full in both arms and legs. Reflexes are symmetric, no pathologic reflexes present. Intact finger to nose bilaterally Sensory: Intact to light touch and temperature Gait: Independent  Labs: I have reviewed the data as listed    Component Value Date/Time   NA 140 09/09/2018 1114   K 3.9 09/09/2018 1114   CL 103 09/09/2018 1114   CO2 28 09/09/2018 1114   GLUCOSE 102 (H) 09/09/2018 1114   BUN 17 09/09/2018 1114   CREATININE 0.70 09/09/2018 1114   CALCIUM 9.0 09/09/2018 1114   PROT 6.1 (L) 09/09/2018 1114   ALBUMIN 3.4 (L) 09/09/2018 1114   AST 13 (L) 09/09/2018 1114   ALT 15 09/09/2018 1114   ALKPHOS 29 (L) 09/09/2018 1114   BILITOT 0.3 09/09/2018 1114   GFRNONAA >60 09/09/2018 1114   GFRAA >60 09/09/2018 1114   Lab Results  Component Value Date   WBC 3.1 (L) 09/09/2018   NEUTROABS 2.6 09/09/2018   HGB 9.4 (L) 09/09/2018   HCT 29.6 (L) 09/09/2018   MCV 103.1  (H) 09/09/2018   PLT 49 (L) 09/09/2018     Assessment/Plan Glioblastoma with isocitrate dehydrogenase gene wildtype (  [place])   Ms. Toppin demonstrates clinically changes today, most predominantly increased fatigue.  This is likely multifactorial, due to cumulation of brain mass, surgery, radiation, and chemotherapy.  We ask that she resume dexamethasone 63m daily, as 242mdose had been discontinued abruptly at the end of the last refill (sometime this month, unclear).  We also recommended starting low dose stimulant for fatigue/energy issues.  Will start with Ritalin 82m44mID.  Counseled on increased risk of seizures, tic/automatism, possible weight loss.  Should continue Keppra 250m82mD, as prior.  We appreciate the opportunity to participate in the care of [facility] should return to clinic in 2 weeks with repeat labs for evaluation.  Might consider resuming temozolomide in metronomic dosing.  All questions were answered. The patient knows to call the clinic with any problems, questions or concerns. No barriers to learning were detected.  The total time spent in the encounter was 25 minutes and more than 50% was on counseling and review of test results   ZachVentura Sellers Medical Director of Neuro-Oncology [facility]20/20 11:08 AM

## 2018-09-23 NOTE — Telephone Encounter (Signed)
Per 4/20 los, appt already scheduled.

## 2018-09-24 ENCOUNTER — Telehealth: Payer: Self-pay

## 2018-09-24 NOTE — Telephone Encounter (Signed)
Tried calling Pt to set up virtual visit. No answer, unable to leave message.

## 2018-10-07 ENCOUNTER — Other Ambulatory Visit: Payer: Self-pay

## 2018-10-07 ENCOUNTER — Encounter: Payer: Self-pay | Admitting: Internal Medicine

## 2018-10-07 ENCOUNTER — Inpatient Hospital Stay: Payer: Medicare Other | Attending: Internal Medicine

## 2018-10-07 ENCOUNTER — Inpatient Hospital Stay (HOSPITAL_BASED_OUTPATIENT_CLINIC_OR_DEPARTMENT_OTHER): Payer: Medicare Other | Admitting: Internal Medicine

## 2018-10-07 ENCOUNTER — Telehealth: Payer: Self-pay | Admitting: Internal Medicine

## 2018-10-07 VITALS — BP 104/61 | HR 72 | Temp 97.8°F | Resp 18 | Ht 63.0 in | Wt 110.0 lb

## 2018-10-07 DIAGNOSIS — F329 Major depressive disorder, single episode, unspecified: Secondary | ICD-10-CM | POA: Insufficient documentation

## 2018-10-07 DIAGNOSIS — D696 Thrombocytopenia, unspecified: Secondary | ICD-10-CM | POA: Insufficient documentation

## 2018-10-07 DIAGNOSIS — C719 Malignant neoplasm of brain, unspecified: Secondary | ICD-10-CM | POA: Insufficient documentation

## 2018-10-07 DIAGNOSIS — E039 Hypothyroidism, unspecified: Secondary | ICD-10-CM | POA: Insufficient documentation

## 2018-10-07 DIAGNOSIS — R112 Nausea with vomiting, unspecified: Secondary | ICD-10-CM | POA: Diagnosis not present

## 2018-10-07 DIAGNOSIS — R5383 Other fatigue: Secondary | ICD-10-CM | POA: Insufficient documentation

## 2018-10-07 DIAGNOSIS — Z7982 Long term (current) use of aspirin: Secondary | ICD-10-CM

## 2018-10-07 DIAGNOSIS — M810 Age-related osteoporosis without current pathological fracture: Secondary | ICD-10-CM | POA: Insufficient documentation

## 2018-10-07 DIAGNOSIS — Z79899 Other long term (current) drug therapy: Secondary | ICD-10-CM | POA: Diagnosis not present

## 2018-10-07 LAB — CBC WITH DIFFERENTIAL (CANCER CENTER ONLY)
Abs Immature Granulocytes: 0.03 10*3/uL (ref 0.00–0.07)
Basophils Absolute: 0 10*3/uL (ref 0.0–0.1)
Basophils Relative: 0 %
Eosinophils Absolute: 0 10*3/uL (ref 0.0–0.5)
Eosinophils Relative: 1 %
HCT: 31 % — ABNORMAL LOW (ref 36.0–46.0)
Hemoglobin: 9.6 g/dL — ABNORMAL LOW (ref 12.0–15.0)
Immature Granulocytes: 1 %
Lymphocytes Relative: 8 %
Lymphs Abs: 0.3 10*3/uL — ABNORMAL LOW (ref 0.7–4.0)
MCH: 33.6 pg (ref 26.0–34.0)
MCHC: 31 g/dL (ref 30.0–36.0)
MCV: 108.4 fL — ABNORMAL HIGH (ref 80.0–100.0)
Monocytes Absolute: 0.5 10*3/uL (ref 0.1–1.0)
Monocytes Relative: 13 %
Neutro Abs: 3.1 10*3/uL (ref 1.7–7.7)
Neutrophils Relative %: 77 %
Platelet Count: 138 10*3/uL — ABNORMAL LOW (ref 150–400)
RBC: 2.86 MIL/uL — ABNORMAL LOW (ref 3.87–5.11)
RDW: 17.1 % — ABNORMAL HIGH (ref 11.5–15.5)
WBC Count: 4 10*3/uL (ref 4.0–10.5)
nRBC: 0 % (ref 0.0–0.2)

## 2018-10-07 LAB — CMP (CANCER CENTER ONLY)
ALT: 6 U/L (ref 0–44)
AST: 11 U/L — ABNORMAL LOW (ref 15–41)
Albumin: 3.7 g/dL (ref 3.5–5.0)
Alkaline Phosphatase: 30 U/L — ABNORMAL LOW (ref 38–126)
Anion gap: 10 (ref 5–15)
BUN: 22 mg/dL (ref 8–23)
CO2: 28 mmol/L (ref 22–32)
Calcium: 9.1 mg/dL (ref 8.9–10.3)
Chloride: 105 mmol/L (ref 98–111)
Creatinine: 0.73 mg/dL (ref 0.44–1.00)
GFR, Est AFR Am: >60 mL/min (ref >60)
GFR, Estimated: >60 mL/min (ref >60)
Glucose, Bld: 106 mg/dL — ABNORMAL HIGH (ref 70–99)
Potassium: 4.1 mmol/L (ref 3.5–5.1)
Sodium: 143 mmol/L (ref 135–145)
Total Bilirubin: 0.2 mg/dL — ABNORMAL LOW (ref 0.3–1.2)
Total Protein: 6.5 g/dL (ref 6.5–8.1)

## 2018-10-07 MED ORDER — TEMOZOLOMIDE 100 MG PO CAPS: ORAL_CAPSULE | ORAL | 0 refills | Status: DC

## 2018-10-07 MED ORDER — METHYLPHENIDATE HCL 10 MG PO TABS: 10.0000 mg | ORAL_TABLET | Freq: Two times a day (BID) | ORAL | 0 refills | Status: DC

## 2018-10-07 NOTE — Telephone Encounter (Signed)
Scheduled appt per 5/4 los.  A calendar will be mailed out.

## 2018-10-07 NOTE — Progress Notes (Signed)
[place] at [place], [place] [address] [phone]   Interval Evaluation  Date of Service: 10/07/18 Patient Name: Marie Haynes Patient MRN: [id number] Patient DOB: 11-May-1932 Provider: Ventura Sellers, MD  Identifying Statement:  Marie Haynes is a 85 y.o. female with right temporal glioblastoma   Oncologic History:   Glioblastoma with isocitrate dehydrogenase gene wildtype ([place])   03/20/2018 Surgery    Craniotomy, resection with Dr. Christella Noa     - 05/28/2018 Radiation Therapy    Completes IMRT with concurrent Temodar     Biomarkers:  MGMT Unknown.  IDH 1/2 Wild type.  EGFR Unknown  TERT Unknown   Interval History:  Marie Haynes presents for follow up today.  She continues to describe fatigue and poor energy.  This has not necessarily been helped by the low dose of ritalin.  No side effects from the stimulant.  Left foot continues to drag, but actually no longer requiring full use of walker.  Able to walk without assistance for short distances.  Otherwise no seizures or headaches.  H+P (04/05/18) Patient presented to medical attention several weeks ago with new onset severe headache, which was a new complaint for her.  CT demonstrated a brain mass, and follow up MRI confirmed likely neoplasm.  After review by neurosurgery, she underwent craniotomy and resection of the mass with Dr. Christella Noa on 03/20/18.  She tolerated surgery well without any complaints or post-op deficits.  Since surgery she has been at home recovering, no recurrence of headaches.  One day last week she woke up "very confused" and unable to use left hand/arm in particular.  This gradually improved during the day until she was back at baseline by the evening; this "spell" has not recurred since that time.  She is currently off Keppra and did feel "drowsy" while on 544m twice per day.  She presents today to review pathology, prognosis, treatment options  moving forward.  Medications: Current Outpatient Medications on File Prior to Visit  Medication Sig Dispense Refill   acetaminophen (TYLENOL) 500 MG tablet Take 500 mg by mouth every 6 (six) hours as needed.     aspirin EC 81 MG tablet Take 81 mg by mouth daily.     Calcium Carbonate-Vitamin D (CALCIUM + D PO) Take 3 tablets by mouth 2 (two) times daily.      dexamethasone (DECADRON) 1 MG tablet Take 1 tablet (1 mg total) by mouth daily. 30 tablet 3   docusate sodium (COLACE) 100 MG capsule Take 100 mg by mouth 2 (two) times daily as needed for mild constipation.     HYDROcodone-acetaminophen (NORCO/VICODIN) 5-325 MG tablet Take 1 tablet by mouth every 4 (four) hours as needed. (Patient not taking: Reported on 07/29/2018) 12 tablet 0   HYDROmorphone (DILAUDID) 2 MG tablet Take 1 tablet (2 mg total) by mouth every 4 (four) hours as needed for severe pain. (Patient not taking: Reported on 07/29/2018) 30 tablet 0   levETIRAcetam (KEPPRA) 250 MG tablet TAKE ONE TABLET BY MOUTH TWICE A DAY 60 tablet 2   levothyroxine (SYNTHROID, LEVOTHROID) 75 MCG tablet Take 1 tablet (75 mcg total) by mouth daily before breakfast. 30 tablet 1   methylphenidate (RITALIN) 5 MG tablet Take 1 tablet (5 mg total) by mouth 2 (two) times daily. 60 tablet 0   ondansetron (ZOFRAN) 8 MG tablet Take 1 tablet (8 mg total) by mouth 2 (two) times daily as needed (nausea and vomiting).  Take 30-60 minutes prior to Temodar (Patient not taking: Reported on 09/23/2018) 30 tablet 1   temozolomide (TEMODAR) 100 MG capsule Take 141m cap x2 + 248mcap (22078motal dose) once daily for 5 days on, 23d off. Take on empty stomach & at bedtime to reduce N/V (Patient not taking: Reported on 09/09/2018) 10 capsule 0   temozolomide (TEMODAR) 20 MG capsule Take 69m67mp + 100mg39m x2 (269mg 33ml dose) once daily for 5 days on, 23d off. Take on empty stomach & at bedtime to reduce N/V (Patient not taking: Reported on 09/09/2018) 5 capsule 0     [DISCONTINUED] Investigational Ramipril 1.25 MG capsule WF-1801 Take 4 capsules (5 mg total) by mouth at bedtime. Take at bedtime with or without food. Drink adequate water to avoid dehydration. Avoid salt substitutes that are high in potassium. (Patient not taking: Reported on 09/09/2018) 224 capsule 0   No current facility-administered medications on file prior to visit.     Allergies:  Allergies  Allergen Reactions   Oxycodone-Acetaminophen Anaphylaxis and Nausea And Vomiting   Codeine Other (See Comments)    constipation   Past Medical History:  Past Medical History:  Diagnosis Date   Anxiety and depression    Constipation    DJD (degenerative joint disease)    Family history of adverse reaction to anesthesia    Mother - "shock" - due to anesthesia-    History of radiation therapy 04/15/18- 05/27/18   Right frontal lobe tumor bed initial 46 Gy in 23 fractions, Right frontal lobe tumor bed boost 14 Gy in 7 fractions.    Hypothyroidism    Increased urinary frequency    Dr. MacDiaMatilde Sprang CRAMPS, NOCTURNAL 02/22/2007   Qualifier: Diagnosis of  By: Paz MDLarose Kellsose EPaxtonteoporosis    Spinal stenosis    Stress incontinence    (saw Urology 5-09)     Symptomatic cholelithiasis 2017   reluctant to have surgery   Urge and stress incontinence    Dr. MacDiaMatilde Sprangt Surgical History:  Past Surgical History:  Procedure Laterality Date   APPLICATION OF CRANIAL NAVIGATION N/A 03/20/2018   Procedure: APPLICATION OF CRANIAL NAVIGATION;  Surgeon: CabbelAshok Pall [facility];[place]: Neurosurgery;  Laterality: N/A;   BACK SURGERY  [id number]     CRANIOTOMY Right 03/20/2018   Procedure: Right Temporal craniotomy for tumor resection with brainlab;  Surgeon: CabbelAshok Pall [facility];[place]: Neurosurgery;  Laterality: Right;  Right Temporal craniotomy for tumor resection with brainlab   DILATION AND CURETTAGE OF UTERUS     EYE  SURGERY Bilateral    cataract   Social History:  Social History   Socioeconomic History   Marital status: Married    Spouse name: Not on file   Number of children: 3   Years of education: Not on file   Highest education level: Not on file  Occupational History   Occupation: retired englisPensions consultantRED  Social Needs Designer, fashion/clothingn: Not on file   Food insecurity:    Worry: Not on file    Inability: Not on file   Transportation needs:    Medical: No    Non-medical: No  Tobacco Use   Smoking status: Never Smoker   Smokeless tobacco: Never Used  Substance and Sexual Activity   Alcohol use: Yes    Comment: Rare   Drug  use: No   Sexual activity: Not on file  Lifestyle   Physical activity:    Days per week: Not on file    Minutes per session: Not on file   Stress: Not on file  Relationships   Social connections:    Talks on phone: Not on file    Gets together: Not on file    Attends religious service: Not on file    Active member of club or organization: Not on file    Attends meetings of clubs or organizations: Not on file    Relationship status: Not on file   Intimate partner violence:    Fear of current or ex partner: No    Emotionally abused: No    Physically abused: No    Forced sexual activity: No  Other Topics Concern   Not on file  Social History Narrative   Household- pt and husband   Daughter bipolar   Family History:  Family History  Problem Relation Age of Onset   Heart failure Mother    Heart attack Mother 19       dx in her 51s   Colon cancer Neg Hx    Breast cancer Neg Hx    Diabetes Neg Hx     Review of Systems: Constitutional: weight loss, fatigue Eyes: Denies blurriness of vision Ears, nose, mouth, throat, and face: Denies mucositis or sore throat Respiratory: Denies cough, dyspnea or wheezes Cardiovascular: Denies palpitation, chest discomfort or lower extremity  swelling Gastrointestinal:  Denies nausea, constipation, diarrhea GU: Denies dysuria or incontinence Skin: Denies abnormal skin rashes Neurological: Per HPI Musculoskeletal: Denies joint pain, back or neck discomfort. No decrease in ROM Behavioral/Psych: Denies anxiety, disturbance in thought content, and mood instability  Physical Exam: Vitals:   10/07/18 1233  BP: 104/61  Pulse: 72  Resp: 18  Temp: 97.8 F (36.6 C)  SpO2: 98%   ECOG 1 KPS: 90. General: Alert, cooperative, pleasant, in no acute distress Head: Craniotomy scar noted, dry and intact. EENT: No conjunctival injection or scleral icterus. Oral mucosa moist Lungs: Resp effort normal Cardiac: Regular rate and rhythm Abdomen: Soft, non-distended abdomen Skin: No rashes cyanosis or petechiae. Extremities: No clubbing or edema  Neurologic Exam: Mental Status: Awake, alert, attentive to examiner. Oriented to self and environment. Language is fluent with intact comprehension.  Cranial Nerves: Right eye modest impairment of acuity. Visual fields are full. Extra-ocular movements intact. R eye ptosis. Face is symmetric, tongue midline. Motor: Tone and bulk are normal. Power is full in both arms and legs. Reflexes are symmetric, no pathologic reflexes present. Intact finger to nose bilaterally Sensory: Intact to light touch and temperature Gait: Independent  Labs: I have reviewed the data as listed    Component Value Date/Time   NA 143 10/07/2018 1204   K 4.1 10/07/2018 1204   CL 105 10/07/2018 1204   CO2 28 10/07/2018 1204   GLUCOSE 106 (H) 10/07/2018 1204   BUN 22 10/07/2018 1204   CREATININE 0.73 10/07/2018 1204   CALCIUM 9.1 10/07/2018 1204   PROT 6.5 10/07/2018 1204   ALBUMIN 3.7 10/07/2018 1204   AST 11 (L) 10/07/2018 1204   ALT 6 10/07/2018 1204   ALKPHOS 30 (L) 10/07/2018 1204   BILITOT 0.2 (L) 10/07/2018 1204   GFRNONAA >60 10/07/2018 1204   GFRAA >60 10/07/2018 1204   Lab Results  Component Value  Date   WBC 4.0 10/07/2018   NEUTROABS 3.1 10/07/2018   HGB 9.6 (L) 10/07/2018  HCT 31.0 (L) 10/07/2018   MCV 108.4 (H) 10/07/2018   PLT 138 (L) 10/07/2018     Assessment/Plan Glioblastoma with isocitrate dehydrogenase gene wildtype ([place])   Ms. Longino is clinically stable today.  Her thrombocytopenia has resolved.  We recommended initiating treatment with Temozolomide 150 mg/m2, on for five days and off for twenty three days in twenty eight day cycles. The patient will have a complete blood count performed on days 21 and 28 of each cycle, and a comprehensive metabolic panel performed on day 28 of each cycle. Labs may need to be performed more often. Zofran will prescribed for home use for nausea/vomiting.   Modest dose reduction will be in place, from 273m to 2015mbecause of thrombocytopenia.  Chemotherapy should be held for the following:  ANC less than 1,000  Platelets less than 100,000  LFT or creatinine greater than 2x ULN  If clinical concerns/contraindications develop  She should continue dexamethasone 31m67maily, and we will increase Ritalin to 72m55mD.  Should continue Keppra 250mg131m, as prior.  We appreciate the opportunity to participate in the care of [facility] should return to clinic in 1 month with MRI brain for evaluation.  Might consider resuming temozolomide in metronomic dosing cytopenias recur and scan is stable.  All questions were answered. The patient knows to call the clinic with any problems, questions or concerns. No barriers to learning were detected.  The total time spent in the encounter was 25 minutes and more than 50% was on counseling and review of test results   ZachaVentura SellersMedical Director of Neuro-Oncology [facility]4/20 12:22 PM

## 2018-10-09 ENCOUNTER — Other Ambulatory Visit: Payer: Self-pay | Admitting: Pharmacist

## 2018-10-09 MED FILL — TEMOZOLOMIDE 100 MG CAPS: 100 | 28 days supply | Qty: 10 | Fill #0

## 2018-10-11 ENCOUNTER — Telehealth: Payer: Self-pay | Admitting: Medical Oncology

## 2018-10-11 NOTE — Telephone Encounter (Signed)
[id number]: 5 Month Post RT assessment (Week 26) phone call Call x2  LVMOM with spouse regarding study. Informed spouse the reason of the call regarding completion of study and checking on patient for any possible adverse events as well as medication changes since last month's call. Spouse was asked to return call at earliest convenience, thanked for his time and their continued support of study.  Maxwell Marion, RN, BSN, [facility] Clinical Research 10/11/2018 4:07 PM

## 2018-10-14 ENCOUNTER — Other Ambulatory Visit: Payer: Self-pay | Admitting: Radiation Therapy

## 2018-10-15 ENCOUNTER — Encounter: Payer: Self-pay | Admitting: Medical Oncology

## 2018-10-15 DIAGNOSIS — C719 Malignant neoplasm of brain, unspecified: Secondary | ICD-10-CM

## 2018-10-15 NOTE — Progress Notes (Signed)
[id number] Ramipril :  5 month Post RT (week 26 Phone Call) Call to patient and spouse regarding this final study assessment. I spoke to patient this morning and reviewed with her over the phone any adverse events and changes to her medications. Patient reports that she doesn't feel she's had any side effects related to the study drug Ramipril. Patient reports to be doing well except for her energy level. Patient was started on ritalin in April and then had a dose increase approximately a week ago and patient reports no improvement in her energy. I encouraged her to contact Dr. Mickeal Skinner and inform him of this now, rather than wait until her clinic visit with him in June. Patient gave verbal understanding to this.  Concomitant medications: reviewed and changes noted for documentation with study. Patient reports to have completed most recent cycle of Temodar on Sunday the 10th of May.  Adverse events: Reviewed with patient and patient reports nothing new or worsening. Patient with some improvement in weight gain. Ongoing adverse events: arthritis with left hip (Gr1) and lower back pain (Gr1) and states OTC tylenol is helping with the pain/discomfort, anorexia (Gr 2), weight loss (Gr 1), increased fatigue (Gr2) and intermittent nausea (gr 1). Per study protocol, grade 1 and 2 expected/unexpected AE's do not need to be reported with study.  Plan: Patient and spouse were informed that this is the final study assessment and that patient has completed the study. Patient and spouse were thanked for their time, support and contribution to study. All their questions were answered to their satisfaction and they were encouraged to call DrMickeal Skinner or myself with questions or concerns.  Maxwell Marion, RN, BSN, [facility] 10/15/2018 11:24 AM

## 2018-10-16 DIAGNOSIS — H1852 Epithelial (juvenile) corneal dystrophy: Secondary | ICD-10-CM | POA: Diagnosis not present

## 2018-10-16 DIAGNOSIS — Z961 Presence of intraocular lens: Secondary | ICD-10-CM | POA: Diagnosis not present

## 2018-10-16 DIAGNOSIS — H04123 Dry eye syndrome of bilateral lacrimal glands: Secondary | ICD-10-CM | POA: Diagnosis not present

## 2018-10-17 ENCOUNTER — Ambulatory Visit
Admission: RE | Admit: 2018-10-17 | Discharge: 2018-10-17 | Disposition: A | Discharge status: Home / Self Care | Payer: Medicare Other | Source: Ambulatory Visit | Attending: Internal Medicine | Admitting: Internal Medicine

## 2018-10-17 ENCOUNTER — Other Ambulatory Visit: Payer: Self-pay

## 2018-10-17 DIAGNOSIS — C719 Malignant neoplasm of brain, unspecified: Secondary | ICD-10-CM

## 2018-10-17 MED ORDER — GADOBENATE DIMEGLUMINE 529 MG/ML IV SOLN
10.0000 mL | Freq: Once | INTRAVENOUS | Status: AC | PRN
  Administered 2018-10-17: 10 mL via INTRAVENOUS

## 2018-11-04 ENCOUNTER — Other Ambulatory Visit: Payer: Self-pay

## 2018-11-04 ENCOUNTER — Inpatient Hospital Stay: Payer: Medicare Other

## 2018-11-04 ENCOUNTER — Inpatient Hospital Stay: Payer: Medicare Other | Attending: Internal Medicine | Admitting: Internal Medicine

## 2018-11-04 VITALS — BP 124/72 | HR 93 | Temp 99.1°F | Resp 18 | Ht 63.0 in | Wt 114.0 lb

## 2018-11-04 DIAGNOSIS — F329 Major depressive disorder, single episode, unspecified: Secondary | ICD-10-CM

## 2018-11-04 DIAGNOSIS — R51 Headache: Secondary | ICD-10-CM | POA: Diagnosis not present

## 2018-11-04 DIAGNOSIS — R5383 Other fatigue: Secondary | ICD-10-CM

## 2018-11-04 DIAGNOSIS — C719 Malignant neoplasm of brain, unspecified: Secondary | ICD-10-CM

## 2018-11-04 DIAGNOSIS — M1990 Unspecified osteoarthritis, unspecified site: Secondary | ICD-10-CM | POA: Diagnosis not present

## 2018-11-04 DIAGNOSIS — Z923 Personal history of irradiation: Secondary | ICD-10-CM | POA: Diagnosis not present

## 2018-11-04 DIAGNOSIS — R350 Frequency of micturition: Secondary | ICD-10-CM

## 2018-11-04 DIAGNOSIS — C712 Malignant neoplasm of temporal lobe: Secondary | ICD-10-CM | POA: Diagnosis not present

## 2018-11-04 DIAGNOSIS — Z7982 Long term (current) use of aspirin: Secondary | ICD-10-CM | POA: Diagnosis not present

## 2018-11-04 DIAGNOSIS — M810 Age-related osteoporosis without current pathological fracture: Secondary | ICD-10-CM | POA: Diagnosis not present

## 2018-11-04 DIAGNOSIS — Z79899 Other long term (current) drug therapy: Secondary | ICD-10-CM | POA: Diagnosis not present

## 2018-11-04 DIAGNOSIS — D696 Thrombocytopenia, unspecified: Secondary | ICD-10-CM

## 2018-11-04 DIAGNOSIS — R531 Weakness: Secondary | ICD-10-CM | POA: Diagnosis not present

## 2018-11-04 DIAGNOSIS — E039 Hypothyroidism, unspecified: Secondary | ICD-10-CM | POA: Diagnosis not present

## 2018-11-04 LAB — CMP (CANCER CENTER ONLY)
ALT: 9 U/L (ref 0–44)
AST: 11 U/L — ABNORMAL LOW (ref 15–41)
Albumin: 3.7 g/dL (ref 3.5–5.0)
Alkaline Phosphatase: 28 U/L — ABNORMAL LOW (ref 38–126)
Anion gap: 8 (ref 5–15)
BUN: 18 mg/dL (ref 8–23)
CO2: 29 mmol/L (ref 22–32)
Calcium: 9.2 mg/dL (ref 8.9–10.3)
Chloride: 105 mmol/L (ref 98–111)
Creatinine: 0.7 mg/dL (ref 0.44–1.00)
GFR, Est AFR Am: >60 mL/min (ref >60)
GFR, Estimated: >60 mL/min (ref >60)
Glucose, Bld: 95 mg/dL (ref 70–99)
Potassium: 3.8 mmol/L (ref 3.5–5.1)
Sodium: 142 mmol/L (ref 135–145)
Total Bilirubin: 0.3 mg/dL (ref 0.3–1.2)
Total Protein: 6.5 g/dL (ref 6.5–8.1)

## 2018-11-04 LAB — CBC WITH DIFFERENTIAL (CANCER CENTER ONLY)
Abs Immature Granulocytes: 0.02 10*3/uL (ref 0.00–0.07)
Basophils Absolute: 0 10*3/uL (ref 0.0–0.1)
Basophils Relative: 0 %
Eosinophils Absolute: 0.1 10*3/uL (ref 0.0–0.5)
Eosinophils Relative: 2 %
HCT: 32.2 % — ABNORMAL LOW (ref 36.0–46.0)
Hemoglobin: 10.1 g/dL — ABNORMAL LOW (ref 12.0–15.0)
Immature Granulocytes: 0 %
Lymphocytes Relative: 6 %
Lymphs Abs: 0.3 10*3/uL — ABNORMAL LOW (ref 0.7–4.0)
MCH: 34.8 pg — ABNORMAL HIGH (ref 26.0–34.0)
MCHC: 31.4 g/dL (ref 30.0–36.0)
MCV: 111 fL — ABNORMAL HIGH (ref 80.0–100.0)
Monocytes Absolute: 0.5 10*3/uL (ref 0.1–1.0)
Monocytes Relative: 9 %
Neutro Abs: 4.8 10*3/uL (ref 1.7–7.7)
Neutrophils Relative %: 83 %
Platelet Count: 46 10*3/uL — ABNORMAL LOW (ref 150–400)
RBC: 2.9 MIL/uL — ABNORMAL LOW (ref 3.87–5.11)
RDW: 14.3 % (ref 11.5–15.5)
WBC Count: 5.8 10*3/uL (ref 4.0–10.5)
nRBC: 0 % (ref 0.0–0.2)

## 2018-11-04 MED ORDER — METHYLPHENIDATE HCL 10 MG PO TABS: 10.0000 mg | ORAL_TABLET | Freq: Every evening | ORAL | 0 refills | Status: DC

## 2018-11-04 MED ORDER — METHYLPHENIDATE HCL 20 MG PO TABS: 20.0000 mg | ORAL_TABLET | Freq: Every day | ORAL | 0 refills | Status: DC

## 2018-11-04 NOTE — Progress Notes (Signed)
[facility] at [place], [place] [address] [phone]   Interval Evaluation  Date of Service: 11/04/18 Patient Name: Marie Haynes Patient MRN: [id number] Patient DOB: 1931/09/20 Provider: Ventura Sellers, MD  Identifying Statement:  Marie Haynes is a 85 y.o. 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 Christella Noa     - 05/28/2018 Radiation Therapy    Completes IMRT with concurrent Temodar     Biomarkers:  MGMT Unknown.  IDH 1/2 Wild type.  EGFR Unknown  TERT Unknown   Interval History:  [place] presents for follow up today after completing cycle #2 of Temozolomide.  She continues to describe fatigue and poor energy, which has not been helped at all by the Ritalin.  Left foot continues to drag, she is again requiring full use of walker.  Sleep volume is up to 10-11 hours per day/night.  Also had severe constipation during the week of Temodar which has now resolved. Otherwise no seizures or headaches.  H+P (04/05/18) Patient presented to medical attention several weeks ago with new onset severe headache, which was a new complaint for her.  CT demonstrated a brain mass, and follow up MRI confirmed likely neoplasm.  After review by neurosurgery, she underwent craniotomy and resection of the mass with Dr. Christella Noa on 03/20/18.  She tolerated surgery well without any complaints or post-op deficits.  Since surgery she has been at home recovering, no recurrence of headaches.  One day last week she woke up "very confused" and unable to use left hand/arm in particular.  This gradually improved during the day until she was back at baseline by the evening; this "spell" has not recurred since that time.  She is currently off Keppra and did feel "drowsy" while on 596m twice per day.  She presents  today to review pathology, prognosis, treatment options moving forward.  Medications: Current Outpatient Medications on File Prior to Visit  Medication Sig Dispense Refill   acetaminophen (TYLENOL) 500 MG tablet Take 500 mg by mouth every 6 (six) hours as needed.     aspirin EC 81 MG tablet Take 81 mg by mouth daily.     Calcium Carbonate-Vitamin D (CALCIUM + D PO) Take 3 tablets by mouth 2 (two) times daily.      dexamethasone (DECADRON) 1 MG tablet Take 1 tablet (1 mg total) by mouth daily. 30 tablet 3   docusate sodium (COLACE) 100 MG capsule Take 100 mg by mouth 2 (two) times daily as needed for mild constipation.     HYDROcodone-acetaminophen (NORCO/VICODIN) 5-325 MG tablet Take 1 tablet by mouth every 4 (four) hours as needed. (Patient not taking: Reported on 07/29/2018) 12 tablet 0   HYDROmorphone (DILAUDID) 2 MG tablet Take 1 tablet (2 mg total) by mouth every 4 (four) hours as needed for severe pain. (Patient not taking: Reported on 07/29/2018) 30 tablet 0   levETIRAcetam (KEPPRA) 250 MG tablet TAKE ONE TABLET BY MOUTH TWICE A DAY 60 tablet 2   levothyroxine (SYNTHROID, LEVOTHROID) 75 MCG tablet Take 1 tablet (75 mcg total) by mouth daily before breakfast. 30 tablet 1   methylphenidate (RITALIN) 10 MG tablet Take 1 tablet (10 mg total) by mouth 2 (two) times daily. 60 tablet 0   ondansetron (ZOFRAN) 8 MG tablet Take 1 tablet (8 mg total) by mouth  2 (two) times daily as needed (nausea and vomiting). Take 30-60 minutes prior to Temodar (Patient not taking: Reported on 09/23/2018) 30 tablet 1   temozolomide (TEMODAR) 100 MG capsule Take 273m (1010mx2) once nightly for 5 days on, 23d off. Take on empty stomach & at bedtime to reduce N/V 10 capsule 0   [DISCONTINUED] Investigational Ramipril 1.25 MG capsule [id number] Take 4 capsules (5 mg total) by mouth at bedtime. Take at bedtime with or without food. Drink adequate water to avoid dehydration. Avoid salt substitutes that are high in  potassium. (Patient not taking: Reported on 09/09/2018) 224 capsule 0   No current facility-administered medications on file prior to visit.     Allergies:  Allergies  Allergen Reactions   Oxycodone-Acetaminophen Anaphylaxis and Nausea And Vomiting   Codeine Other (See Comments)    constipation   Past Medical History:  Past Medical History:  Diagnosis Date   Anxiety and depression    Constipation    DJD (degenerative joint disease)    Family history of adverse reaction to anesthesia    Mother - "shock" - due to anesthesia-    History of radiation therapy 04/15/18- 05/27/18   Right frontal lobe tumor bed initial 46 Gy in 23 fractions, Right frontal lobe tumor bed boost 14 Gy in 7 fractions.    Hypothyroidism    Increased urinary frequency    Dr. MaMatilde Sprang LEG CRAMPS, NOCTURNAL 02/22/2007   Qualifier: Diagnosis of  By: PaLarose KellsD, JoWesson  Osteoporosis    Spinal stenosis    Stress incontinence    (saw Urology 5-09)     Symptomatic cholelithiasis 2017   reluctant to have surgery   Urge and stress incontinence    Dr. MaMatilde Sprang Past Surgical History:  Past Surgical History:  Procedure Laterality Date   APPLICATION OF CRANIAL NAVIGATION N/A 03/20/2018   Procedure: APPLICATION OF CRANIAL NAVIGATION;  Surgeon: CaAshok PallMD;  [facility]surgery;  Laterality: N/A;   BACK SURGERY  [id number] COLONOSCOPY     CRANIOTOMY Right 03/20/2018   Procedure: Right Temporal craniotomy for tumor resection with brainlab;  Surgeon: CaAshok PallMD;  [facility] Service: Neurosurgery;  Laterality: Right;  Right Temporal craniotomy for tumor resection with brainlab   DILATION AND CURETTAGE OF UTERUS     EYE SURGERY Bilateral    cataract   Social History:  Social History   Socioeconomic History   Marital status: Married    Spouse name: Not on file   Number of children: 3   Years of education: Not on file   Highest education level:  Not on file  Occupational History   Occupation: retired enPensions consultantRETIRED  Social NeDesigner, fashion/clothingtrain: Not on file   Food insecurity:    Worry: Not on file    Inability: Not on file   Transportation needs:    Medical: No    Non-medical: No  Tobacco Use   Smoking status: Never Smoker   Smokeless tobacco: Never Used  Substance and Sexual Activity   Alcohol use: Yes    Comment: Rare   Drug use: No   Sexual activity: Not on file  Lifestyle   Physical activity:    Days per week: Not on file    Minutes per session: Not on file   Stress: Not on file  Relationships   Social connections:  Talks on phone: Not on file    Gets together: Not on file    Attends religious service: Not on file    Active member of club or organization: Not on file    Attends meetings of clubs or organizations: Not on file    Relationship status: Not on file   Intimate partner violence:    Fear of current or ex partner: No    Emotionally abused: No    Physically abused: No    Forced sexual activity: No  Other Topics Concern   Not on file  Social History Narrative   Household- pt and husband   Daughter bipolar   Family History:  Family History  Problem Relation Age of Onset   Heart failure Mother    Heart attack Mother 57       dx in her 84s   Colon cancer Neg Hx    Breast cancer Neg Hx    Diabetes Neg Hx     Review of Systems: Constitutional: weight loss, fatigue Eyes: Denies blurriness of vision Ears, nose, mouth, throat, and face: Denies mucositis or sore throat Respiratory: Denies cough, dyspnea or wheezes Cardiovascular: Denies palpitation, chest discomfort or lower extremity swelling Gastrointestinal:  Denies nausea, constipation, diarrhea GU: Denies dysuria or incontinence Skin: Denies abnormal skin rashes Neurological: Per HPI Musculoskeletal: Denies joint pain, back or neck discomfort. No decrease in ROM Behavioral/Psych:  Denies anxiety, disturbance in thought content, and mood instability  Physical Exam: Vitals:   11/04/18 1203  BP: 124/72  Pulse: 93  Resp: 18  Temp: 99.1 F (37.3 C)  SpO2: 100%    KPS: 90. General: Alert, cooperative, pleasant, in no acute distress Head: Craniotomy scar noted, dry and intact. EENT: No conjunctival injection or scleral icterus. Oral mucosa moist Lungs: Resp effort normal Cardiac: Regular rate and rhythm Abdomen: Soft, non-distended abdomen Skin: No rashes cyanosis or petechiae. Extremities: No clubbing or edema  Neurologic Exam: Mental Status: Awake, alert, attentive to examiner. Oriented to self and environment. Language is fluent with intact comprehension.  Cranial Nerves: Right eye modest impairment of acuity. Visual fields are full. Extra-ocular movements intact. R eye ptosis. Face is symmetric, tongue midline. Motor: Tone and bulk are normal. Power is full in both arms and legs. Reflexes are symmetric, no pathologic reflexes present. Intact finger to nose bilaterally Sensory: Intact to light touch and temperature Gait: Independent  Labs: I have reviewed the data as listed    Component Value Date/Time   NA 143 10/07/2018 1204   K 4.1 10/07/2018 1204   CL 105 10/07/2018 1204   CO2 28 10/07/2018 1204   GLUCOSE 106 (H) 10/07/2018 1204   BUN 22 10/07/2018 1204   CREATININE 0.73 10/07/2018 1204   CALCIUM 9.1 10/07/2018 1204   PROT 6.5 10/07/2018 1204   ALBUMIN 3.7 10/07/2018 1204   AST 11 (L) 10/07/2018 1204   ALT 6 10/07/2018 1204   ALKPHOS 30 (L) 10/07/2018 1204   BILITOT 0.2 (L) 10/07/2018 1204   GFRNONAA >60 10/07/2018 1204   GFRAA >60 10/07/2018 1204   Lab Results  Component Value Date   WBC 5.8 11/04/2018   NEUTROABS 4.8 11/04/2018   HGB 10.1 (L) 11/04/2018   HCT 32.2 (L) 11/04/2018   MCV 111.0 (H) 11/04/2018   PLT 46 (L) 11/04/2018   Imaging:  [place] Clinician Interpretation: I have personally reviewed the CNS images as listed.  My  interpretation, in the context of the patient's clinical presentation, is progressive disease  Mr  Brain W Wo Contrast  Result Date: 10/18/2018 CLINICAL DATA:  Followup right temporal glioblastoma EXAM: MRI HEAD WITHOUT AND WITH CONTRAST TECHNIQUE: Multiplanar, multiecho pulse sequences of the brain and surrounding structures were obtained without and with intravenous contrast. CONTRAST:  74m MULTIHANCE GADOBENATE DIMEGLUMINE 529 MG/ML IV SOLN COMPARISON:  09/06/2018 FINDINGS: Brain: Previous right pterional craniotomy for debulking of a right temporal lobe glioblastoma. Interval findings worsening with tumor growing along the anterior margin of the resection at the temporal tip, tumor nodule measuring 10 x 17 mm. Some generalized thickening and enhancement of the wall of the resection likely indicate tumor progression. Progressive T2 signal and enhancement along the lateral ependymal and subependymal surfaces of the right temporal horn of the lateral ventricle consistent with tumor progression. The degree of edema in the right hemisphere appears similar. No frank mass effect or shift. Chronic white matter signal elsewhere thin both hemispheres appears similar. No obstructive hydrocephalus. No extra-axial fluid collection. Vascular: Major vessels at the base of the brain show flow. Skull and upper cervical spine: Otherwise negative Sinuses/Orbits: Right mastoid effusion again demonstrated. Paranasal sinuses are clear. Orbits negative. Other: None IMPRESSION: Progression/worsening of disease along the margins of the resection cavity. Increased thickness and enhancement of the margins, including nodular disease at the anterior margin of the resection and including progression of disease along the ependymal and subependymal margins of the temporal horn of the right lateral ventricle. Electronically Signed   By: MNelson ChimesM.D.   On: 10/18/2018 09:36     Assessment/Plan Glioblastoma with isocitrate  dehydrogenase gene wildtype [place]   Ms. Schnider is clinically and radiographically progressive today.  Her thrombocytopenia has unfortunately recurred after just one additional cycle of Temodar.  We provided three separate avenues for continuation of care: -Recheck CBC in 2 weeks, with plan to resume Temodar in daily metronomic dosing given cytopenias -Begin therapy with tumor-treating fields, deferring further cytotoxic chemotherapy -Transition to palliative measures only  In the meantime she should continue dexamethasone 124mBID, and we will increase Ritalin to 2079m0mg BID.  Should continue Keppra 250m20mD, as prior.  We appreciate the opportunity to participate in the care of CaroSara Leehe and her family will discuss recommendations made to today and reach out to us wKoreah a decision.  All questions were answered. The patient knows to call the clinic with any problems, questions or concerns. No barriers to learning were detected.  The total time spent in the encounter was 40 minutes and more than 50% was on counseling and review of test results   ZachVentura Sellers Medical Director of Neuro-Oncology [facility] Group IncWeslEzel01/20 12:08 PM

## 2018-11-05 ENCOUNTER — Telehealth: Payer: Self-pay | Admitting: Internal Medicine

## 2018-11-05 NOTE — Telephone Encounter (Signed)
No los per 6/1. °

## 2018-11-07 ENCOUNTER — Other Ambulatory Visit: Payer: Self-pay | Admitting: Internal Medicine

## 2018-11-07 MED ORDER — METHYLPHENIDATE HCL 10 MG PO TABS: ORAL_TABLET | ORAL | 0 refills | Status: AC

## 2018-11-25 DIAGNOSIS — Z961 Presence of intraocular lens: Secondary | ICD-10-CM | POA: Diagnosis not present

## 2018-11-25 DIAGNOSIS — H1859 Other hereditary corneal dystrophies: Secondary | ICD-10-CM | POA: Diagnosis not present

## 2018-12-05 ENCOUNTER — Telehealth: Payer: Self-pay | Admitting: Internal Medicine

## 2018-12-05 NOTE — Telephone Encounter (Signed)
Called pt per 7/1 sch message - scheduled appt and called pt. No answer and vmail full - unable to leave message   Mailed apt with appt date and time

## 2018-12-10 ENCOUNTER — Telehealth: Payer: Self-pay

## 2018-12-10 NOTE — Telephone Encounter (Signed)
Oral Oncology Patient Advocate Encounter  [facility] has attempted to reach Julius 3 times to refill Carolyns Temodar with no success.  I called Rolan Bucco and left a message with [place] to have him call me back. She had a hard time understanding me, if I don't hear back today I will try again tomorrow.  [facility] Patient Painter Phone [phone] Fax [phone] 12/10/2018   3:04 PM

## 2018-12-12 ENCOUNTER — Telehealth: Payer: Self-pay | Admitting: Pharmacist

## 2018-12-12 ENCOUNTER — Other Ambulatory Visit: Payer: Self-pay

## 2018-12-12 ENCOUNTER — Telehealth: Payer: Self-pay | Admitting: Internal Medicine

## 2018-12-12 ENCOUNTER — Inpatient Hospital Stay: Payer: Medicare Other

## 2018-12-12 ENCOUNTER — Other Ambulatory Visit: Payer: Medicare Other

## 2018-12-12 ENCOUNTER — Inpatient Hospital Stay: Payer: Medicare Other | Attending: Internal Medicine | Admitting: Internal Medicine

## 2018-12-12 VITALS — BP 121/63 | HR 84 | Temp 98.4°F | Resp 18 | Ht 63.0 in | Wt 113.4 lb

## 2018-12-12 DIAGNOSIS — D696 Thrombocytopenia, unspecified: Secondary | ICD-10-CM | POA: Diagnosis not present

## 2018-12-12 DIAGNOSIS — F329 Major depressive disorder, single episode, unspecified: Secondary | ICD-10-CM | POA: Diagnosis not present

## 2018-12-12 DIAGNOSIS — C719 Malignant neoplasm of brain, unspecified: Secondary | ICD-10-CM

## 2018-12-12 DIAGNOSIS — E039 Hypothyroidism, unspecified: Secondary | ICD-10-CM | POA: Diagnosis not present

## 2018-12-12 DIAGNOSIS — R112 Nausea with vomiting, unspecified: Secondary | ICD-10-CM | POA: Insufficient documentation

## 2018-12-12 DIAGNOSIS — M810 Age-related osteoporosis without current pathological fracture: Secondary | ICD-10-CM | POA: Insufficient documentation

## 2018-12-12 DIAGNOSIS — Z923 Personal history of irradiation: Secondary | ICD-10-CM | POA: Diagnosis not present

## 2018-12-12 DIAGNOSIS — Z79899 Other long term (current) drug therapy: Secondary | ICD-10-CM | POA: Insufficient documentation

## 2018-12-12 DIAGNOSIS — Z7982 Long term (current) use of aspirin: Secondary | ICD-10-CM | POA: Diagnosis not present

## 2018-12-12 DIAGNOSIS — C712 Malignant neoplasm of temporal lobe: Secondary | ICD-10-CM | POA: Diagnosis not present

## 2018-12-12 DIAGNOSIS — Z7189 Other specified counseling: Secondary | ICD-10-CM

## 2018-12-12 LAB — CBC WITH DIFFERENTIAL (CANCER CENTER ONLY)
Abs Immature Granulocytes: 0.02 10*3/uL (ref 0.00–0.07)
Basophils Absolute: 0 10*3/uL (ref 0.0–0.1)
Basophils Relative: 1 %
Eosinophils Absolute: 0.1 10*3/uL (ref 0.0–0.5)
Eosinophils Relative: 3 %
HCT: 30.7 % — ABNORMAL LOW (ref 36.0–46.0)
Hemoglobin: 9.7 g/dL — ABNORMAL LOW (ref 12.0–15.0)
Immature Granulocytes: 1 %
Lymphocytes Relative: 10 %
Lymphs Abs: 0.4 10*3/uL — ABNORMAL LOW (ref 0.7–4.0)
MCH: 35.1 pg — ABNORMAL HIGH (ref 26.0–34.0)
MCHC: 31.6 g/dL (ref 30.0–36.0)
MCV: 111.2 fL — ABNORMAL HIGH (ref 80.0–100.0)
Monocytes Absolute: 0.6 10*3/uL (ref 0.1–1.0)
Monocytes Relative: 14 %
Neutro Abs: 2.9 10*3/uL (ref 1.7–7.7)
Neutrophils Relative %: 71 %
Platelet Count: 115 10*3/uL — ABNORMAL LOW (ref 150–400)
RBC: 2.76 MIL/uL — ABNORMAL LOW (ref 3.87–5.11)
RDW: 12.7 % (ref 11.5–15.5)
WBC Count: 3.9 10*3/uL — ABNORMAL LOW (ref 4.0–10.5)
nRBC: 0 % (ref 0.0–0.2)

## 2018-12-12 LAB — CMP (CANCER CENTER ONLY)
ALT: 8 U/L (ref 0–44)
AST: 12 U/L — ABNORMAL LOW (ref 15–41)
Albumin: 3.6 g/dL (ref 3.5–5.0)
Alkaline Phosphatase: 33 U/L — ABNORMAL LOW (ref 38–126)
Anion gap: 7 (ref 5–15)
BUN: 18 mg/dL (ref 8–23)
CO2: 32 mmol/L (ref 22–32)
Calcium: 9.2 mg/dL (ref 8.9–10.3)
Chloride: 105 mmol/L (ref 98–111)
Creatinine: 0.75 mg/dL (ref 0.44–1.00)
GFR, Est AFR Am: >60 mL/min (ref >60)
GFR, Estimated: >60 mL/min (ref >60)
Glucose, Bld: 91 mg/dL (ref 70–99)
Potassium: 4.3 mmol/L (ref 3.5–5.1)
Sodium: 144 mmol/L (ref 135–145)
Total Bilirubin: 0.2 mg/dL — ABNORMAL LOW (ref 0.3–1.2)
Total Protein: 6.7 g/dL (ref 6.5–8.1)

## 2018-12-12 MED ORDER — TEMOZOLOMIDE 20 MG PO CAPS: 80.0000 mg | ORAL_CAPSULE | Freq: Every day | ORAL | 0 refills | Status: AC

## 2018-12-12 NOTE — Progress Notes (Signed)
[facility] [place], [place] [address] [phone]   Interval Evaluation  Date of Service: 12/12/18 Patient Name: Marie Haynes Patient MRN: [id number] Patient DOB: July 25, 1931 Provider: Ventura Sellers, MD  Identifying Statement:  Marie Haynes is a 85 y.o. female with right temporal glioblastoma   Oncologic History: Oncology History  Glioblastoma with isocitrate dehydrogenase gene wildtype ([facility])  03/20/2018 Surgery   Craniotomy, resection with Dr. Christella Noa    - 05/28/2018 Radiation Therapy   Completes IMRT with concurrent Temodar     Biomarkers:  MGMT Unknown.  IDH 1/2 Wild type.  EGFR Unknown  TERT Unknown   Interval History:  Marie Haynes presents for follow up today after recent break from treatment following second cycle of Temodar.  She describes relative stability, with modest decline in balance.  She has been relying on a walker for ambulation.  Denies actual weakness.  Fatigue is stable from prior. Otherwise no seizures or headaches.  H+P (04/05/18) Patient presented to medical attention several weeks ago with new onset severe headache, which was a new complaint for her.  CT demonstrated a brain mass, and follow up MRI confirmed likely neoplasm.  After review by neurosurgery, she underwent craniotomy and resection of the mass with Dr. Christella Noa on 03/20/18.  She tolerated surgery well without any complaints or post-op deficits.  Since surgery she has been at home recovering, no recurrence of headaches.  One day last week she woke up "very confused" and unable to use left hand/arm in particular.  This gradually improved during the day until she was back at baseline by the evening; this "spell" has not recurred since that time.  She is currently off Keppra and did feel "drowsy" while on 555m twice per day.  She presents today to review pathology, prognosis, treatment options moving forward.  Medications: Current  Outpatient Medications on File Prior to Visit  Medication Sig Dispense Refill  . acetaminophen (TYLENOL) 500 MG tablet Take 500 mg by mouth every 6 (six) hours as needed.    .Marland Kitchenaspirin EC 81 MG tablet Take 81 mg by mouth daily.    . Calcium Carbonate-Vitamin D (CALCIUM + D PO) Take 3 tablets by mouth 2 (two) times daily.     .Marland Kitchendexamethasone (DECADRON) 1 MG tablet Take 1 tablet (1 mg total) by mouth daily. 30 tablet 3  . docusate sodium (COLACE) 100 MG capsule Take 100 mg by mouth 2 (two) times daily as needed for mild constipation.    .Marland KitchenHYDROcodone-acetaminophen (NORCO/VICODIN) 5-325 MG tablet Take 1 tablet by mouth every 4 (four) hours as needed. (Patient not taking: Reported on 07/29/2018) 12 tablet 0  . HYDROmorphone (DILAUDID) 2 MG tablet Take 1 tablet (2 mg total) by mouth every 4 (four) hours as needed for severe pain. (Patient not taking: Reported on 07/29/2018) 30 tablet 0  . levETIRAcetam (KEPPRA) 250 MG tablet TAKE ONE TABLET BY MOUTH TWICE A DAY 60 tablet 1  . levothyroxine (SYNTHROID, LEVOTHROID) 75 MCG tablet Take 1 tablet (75 mcg total) by mouth daily before breakfast. 30 tablet 1  . methylphenidate (RITALIN) 10 MG tablet Take 2 tablets (262m in AM, 1 tablet (1049min PM 90 tablet 0  . ondansetron (ZOFRAN) 8 MG tablet Take 1 tablet (8 mg total) by mouth 2 (two) times daily as needed (nausea and vomiting). Take 30-60 minutes prior to Temodar (Patient not taking: Reported on 09/23/2018) 30 tablet 1  . temozolomide (  TEMODAR) 100 MG capsule Take 282m ([id number]) once nightly for 5 days on, 23d off. Take on empty stomach & at bedtime to reduce N/V (Patient not taking: Reported on 11/04/2018) 10 capsule 0  . [DISCONTINUED] Investigational Ramipril 1.25 MG capsule [id number] Take 4 capsules (5 mg total) by mouth at bedtime. Take at bedtime with or without food. Drink adequate water to avoid dehydration. Avoid salt substitutes that are high in potassium. (Patient not taking: Reported on 09/09/2018) 224  capsule 0   No current facility-administered medications on file prior to visit.     Allergies:  Allergies  Allergen Reactions  . Oxycodone-Acetaminophen Anaphylaxis and Nausea And Vomiting  . Codeine Other (See Comments)    constipation   Past Medical History:  Past Medical History:  Diagnosis Date  . Anxiety and depression   . Constipation   . DJD (degenerative joint disease)   . Family history of adverse reaction to anesthesia    Mother - "shock" - due to anesthesia-   . History of radiation therapy 04/15/18- 05/27/18   Right frontal lobe tumor bed initial 46 Gy in 23 fractions, Right frontal lobe tumor bed boost 14 Gy in 7 fractions.   . Hypothyroidism   . Increased urinary frequency    Dr. MaMatilde Sprang. LEG CRAMPS, NOCTURNAL 02/22/2007   Qualifier: Diagnosis of  By: PaLarose KellsD, JoMineral Citysteoporosis   . Spinal stenosis   . Stress incontinence    (saw Urology 5-09)    . Symptomatic cholelithiasis 2017   reluctant to have surgery  . Urge and stress incontinence    Dr. MaMatilde Sprang Past Surgical History:  Past Surgical History:  Procedure Laterality Date  . APPLICATION OF CRANIAL NAVIGATION N/A 03/20/2018   Procedure: APPLICATION OF CRANIAL NAVIGATION;  Surgeon: CaAshok PallMD;  [facility]: Neurosurgery;  Laterality: N/A;  . BACK SURGERY  [phone]. COLONOSCOPY    . CRANIOTOMY Right 03/20/2018   Procedure: Right Temporal craniotomy for tumor resection with brainlab;  Surgeon: CaAshok PallMD;  [facility] [facility] Service: Neurosurgery;  Laterality: Right;  Right Temporal craniotomy for tumor resection with brainlab  . DILATION AND CURETTAGE OF UTERUS    . EYE SURGERY Bilateral    cataract   Social History:  Social History   Socioeconomic History  . Marital status: Married    Spouse name: Not on file  . Number of children: 3  . Years of education: Not on file  . Highest education level: Not on file  Occupational History  . Occupation: retired  enPensions consultantRETIRED  Social Needs  . Financial resource strain: Not on file  . Food insecurity    Worry: Not on file    Inability: Not on file  . Transportation needs    Medical: No    Non-medical: No  Tobacco Use  . Smoking status: Never Smoker  . Smokeless tobacco: Never Used  Substance and Sexual Activity  . Alcohol use: Yes    Comment: Rare  . Drug use: No  . Sexual activity: Not on file  Lifestyle  . Physical activity    Days per week: Not on file    Minutes per session: Not on file  . Stress: Not on file  Relationships  . Social coHerbalistn phone: Not on file    Gets together: Not on file    Attends religious service: Not  on file    Active member of club or organization: Not on file    Attends meetings of clubs or organizations: Not on file    Relationship status: Not on file  . Intimate partner violence    Fear of current or ex partner: No    Emotionally abused: No    Physically abused: No    Forced sexual activity: No  Other Topics Concern  . Not on file  Social History Narrative   Household- pt and husband   Daughter bipolar   Family History:  Family History  Problem Relation Age of Onset  . Heart failure Mother   . Heart attack Mother 15       dx in her 40s  . Colon cancer Neg Hx   . Breast cancer Neg Hx   . Diabetes Neg Hx     Review of Systems: Constitutional: weight loss, fatigue Eyes: Denies blurriness of vision Ears, nose, mouth, throat, and face: Denies mucositis or sore throat Respiratory: Denies cough, dyspnea or wheezes Cardiovascular: Denies palpitation, chest discomfort or lower extremity swelling Gastrointestinal:  Denies nausea, constipation, diarrhea GU: Denies dysuria or incontinence Skin: Denies abnormal skin rashes Neurological: Per HPI Musculoskeletal: Denies joint pain, back or neck discomfort. No decrease in ROM Behavioral/Psych: Denies anxiety, disturbance in thought content, and mood  instability  Physical Exam: Vitals:   12/12/18 1405  BP: 121/63  Pulse: 84  Resp: 18  Temp: 98.4 F (36.9 C)  SpO2: 99%    KPS: 90. General: Alert, cooperative, pleasant, in no acute distress Head: Craniotomy scar noted, dry and intact. EENT: No conjunctival injection or scleral icterus. Oral mucosa moist Lungs: Resp effort normal Cardiac: Regular rate and rhythm Abdomen: Soft, non-distended abdomen Skin: No rashes cyanosis or petechiae. Extremities: No clubbing or edema  Neurologic Exam: Mental Status: Awake, alert, attentive to examiner. Oriented to self and environment. Language is fluent with intact comprehension.  Cranial Nerves: Right eye modest impairment of acuity. Visual fields are full. Extra-ocular movements intact. R eye ptosis. Face is symmetric, tongue midline. Motor: Tone and bulk are normal. Power is full in both arms and legs. Reflexes are symmetric, no pathologic reflexes present. Intact finger to nose bilaterally Sensory: Intact to light touch and temperature Gait: Dystaxic, non-independent  Labs: I have reviewed the data as listed    Component Value Date/Time   NA 144 12/12/2018 1341   K 4.3 12/12/2018 1341   CL 105 12/12/2018 1341   CO2 32 12/12/2018 1341   GLUCOSE 91 12/12/2018 1341   BUN 18 12/12/2018 1341   CREATININE 0.75 12/12/2018 1341   CALCIUM 9.2 12/12/2018 1341   PROT 6.7 12/12/2018 1341   ALBUMIN 3.6 12/12/2018 1341   AST 12 (L) 12/12/2018 1341   ALT 8 12/12/2018 1341   ALKPHOS 33 (L) 12/12/2018 1341   BILITOT 0.2 (L) 12/12/2018 1341   GFRNONAA >60 12/12/2018 1341   GFRAA >60 12/12/2018 1341   Lab Results  Component Value Date   WBC 3.9 (L) 12/12/2018   NEUTROABS 2.9 12/12/2018   HGB 9.7 (L) 12/12/2018   HCT 30.7 (L) 12/12/2018   MCV 111.2 (H) 12/12/2018   PLT 115 (L) 12/12/2018     Assessment/Plan Glioblastoma with isocitrate dehydrogenase gene wildtype ([facility]) [C71.9]  Marie Haynes is clinically stable today.  Her  thrombocytopenia has improved after break from therapy.  We extensively discussed treatment options moving forward in goals of care context.  We recommended initiating treatment with Temozolomide 50 mg/m2 in  daily metronomic dosing. The patient will have a complete blood count performed on days 21 and 28 of each cycle, and a comprehensive metabolic panel performed on day 28 of each cycle. Labs may need to be performed more often. Zofran will prescribed for home use for nausea/vomiting.   Chemotherapy should be held for the following:  ANC less than 1,000  Platelets less than 100,000  LFT or creatinine greater than 2x ULN  If clinical concerns/contraindications develop  She may continue dexamethasone 78m BID, Keppra 2524mBID and we will increase Ritalin to 2043m0mg BID.  Because it has been two months since her last MRI we would like to repeat MRI brain ASAP to obtain new baseline for treatment response.  We appreciate the opportunity to participate in the care of Marie LeeShe and her family will discuss recommendations made to today and reach out to us [place] a decision.  All questions were answered. The patient knows to call the clinic with any problems, questions or concerns. No barriers to learning were detected.  The total time spent in the encounter was 40 minutes and more than 50% was on counseling and review of test results   ZacVentura SellersD Medical Director of Neuro-Oncology [facility] [place]/09/20 2:09 PM

## 2018-12-12 NOTE — Telephone Encounter (Signed)
Oral Oncology Pharmacist Encounter  Received new prescription for Temodar (temozolomide) for the maintenance treatment of glioblastoma, planned duration until disease progression or unacceptable toxicity.  Patient received concurrent chemoradiation with Temodar dosed at 75 mg/m2/d x 42 days, 06/15/17-05/27/18, which she tolerated fairly well Patient then went on to receive maintenance Temodar at 150 mg/m2/d for 5 days on, 23 days off, repeated every 28 days, and experienced several complications including chest pain and severe thrombocytopenia. Patient was only able to complete ~2.5 cycles between Jan-July of 2020 Last Temodar was early May 2020  Patient is now under evaluation to re-start maintenance Temodar at 50 mg/m2 (80 mg) by mouth once daily, contoniuously  Labs from 12/12/2018 assessed, OK for treatment. Platelet count is much improved following treatment break  Current medication list in Epic reviewed, no DDIs with Temodar identified.  Prescription has been e-scribed to the [facility] for benefits analysis and approval by MD.  Oral Oncology Clinic will continue to follow for insurance authorization, copayment issues, initial counseling and start date.  Johny Drilling, PharmD, BCPS, BCOP  12/12/2018 3:26 PM Oral Oncology Clinic [phone]

## 2018-12-12 NOTE — Telephone Encounter (Signed)
Scheduled appt per 7/9 los. Spoke with patient spouse and he is aware of there appt date and time.

## 2018-12-13 ENCOUNTER — Telehealth: Payer: Self-pay | Admitting: Emergency Medicine

## 2018-12-13 NOTE — Telephone Encounter (Signed)
Oral Chemotherapy Pharmacist Encounter   I spoke with patient's husband, Marie Haynes, for overview of dose and frequency change of Temodar (temozolomide) for the maintenance treatment of glioblastoma, planned duration until disease progression or unacceptable toxicity.  Counseled on administration, dosing, side effects, monitoring, drug-food interactions, safe handling, storage, and disposal.  Patient will take Temodar 20mg  capsules, 4 capsules (80 mg) by mouth once daily, may take at bedtime and on an empty stomach to decrease nausea and vomiting.  Temodar re-start date: 12/16/2018   They still have a supply of Zofran 8mg  tablets, and may take 1/2 - 1 tablet by mouth 30-60 min prior to Temodar dose to help decrease N/V. They have been struggling with constipation, and then it's overcorrection, due to the Zofran. Marie Haynes states Marie Haynes did not experience nausea with temozolomide during radiation course. They will try temozolomide administration withOUT ondansetron prophylaxis for the 1st dose. If nausea occurs, they will try ondansetron 4mg  for prevention to see if that is enough, prior to using 8mg .   Adverse effects include but are not limited to: nausea, vomiting, anorexia, GI upset, rash, drug fever, and fatigue. Rare but serious adverse effects of pneumocystis pneumonia and secondary malignancy also discussed.  Marie Haynes will contact the office to further discuss strategies to manage constipation if they occur secondary to ondansetron dosing.  PCP prophylaxis will not be initiated at this time, but may be added based on lymphocyte count in the future.  Reviewed importance of keeping a medication schedule and plan for any missed doses.  Medication reconciliation performed and medication/allergy list updated.  Insurance authorization for Temodar was not required at this time. Test claim at the pharmacy revealed copayment $0 for 1st fill of Temodar billed to Medicare Part B coverage and  Medicare supplement.  Marie Haynes will pick this up from the [facility] on 12/16/2018.  Marie Haynes informed the pharmacy will reach out 5-7 days prior to needing next fill of Temodar to coordinate continued medication acquisition to prevent break in therapy.  He understands that refills were not written on prescription and Marie Haynes's labs will be checked and toleration assessed prior to next prescription of Temodar being sent to the pharmacy.  All questions answered.  Marie Haynes voiced understanding and appreciation.   They know to call the office with questions or concerns.  Johny Drilling, PharmD, BCPS, BCOP  12/13/2018   2:46 PM Oral Oncology Clinic [phone]

## 2018-12-13 NOTE — Telephone Encounter (Signed)
Pt's husband Rolan Bucco called requesting a call back from Dr. Renda Rolls nurse stating "I just got a call that I think was about her MRI scheduling but I missed it.  Can his nurse call me back?  It's not urgent, I just want to make sure I'm not missing anything."  Desk RN Beth for [facility] made aware, states she will call the pt back.

## 2018-12-16 MED FILL — TEMOZOLOMIDE 20 MG CAPS: 20 | 28 days supply | Qty: 112 | Fill #0

## 2018-12-17 ENCOUNTER — Other Ambulatory Visit: Payer: Self-pay | Admitting: *Deleted

## 2018-12-17 ENCOUNTER — Telehealth: Payer: Self-pay | Admitting: Pharmacist

## 2018-12-17 DIAGNOSIS — C719 Malignant neoplasm of brain, unspecified: Secondary | ICD-10-CM

## 2018-12-17 NOTE — Telephone Encounter (Signed)
Oral Oncology Patient Advocate Encounter  Confirmed with [facility] that Temodar was picked up on 12/16/18 with a $0 copay.   [place] [facility] [place] Phone [phone] Fax [phone] 12/17/2018   10:09 AM

## 2018-12-17 NOTE — Telephone Encounter (Signed)
Oral Oncology Pharmacist Encounter  Received call from patient's husband, Rolan Bucco, with some additional questions about Temodar re-start.  Dr. Mickeal Skinner is wanting to repeat brain MRI for new baseline. Mr. Severtson was wondering if patient should wait until MRI is completed prior to restarting on Temodar.  Mr. Hankins also stated that patient has had some additional functional decline over the past 4-5 days, and he is wondering about the intended benefit of continued Temodar.  Patient transferred to Dr. Renda Rolls collaborative RN to ask these questions.  I also spoke with Dr. Mickeal Skinner to report Mr. Allemand questions. He will call Mr. Montas to discuss further.  Johny Drilling, PharmD, BCPS, BCOP  12/17/2018 11:13 AM Oral Oncology Clinic [phone]

## 2018-12-19 ENCOUNTER — Other Ambulatory Visit: Payer: Self-pay | Admitting: Internal Medicine

## 2018-12-19 MED ORDER — CIPROFLOXACIN HCL 500 MG PO TABS: 500.0000 mg | ORAL_TABLET | Freq: Two times a day (BID) | ORAL | 0 refills | Status: AC

## 2018-12-20 DIAGNOSIS — Z681 Body mass index (BMI) 19 or less, adult: Secondary | ICD-10-CM | POA: Diagnosis not present

## 2018-12-20 DIAGNOSIS — C719 Malignant neoplasm of brain, unspecified: Secondary | ICD-10-CM | POA: Diagnosis not present

## 2018-12-20 DIAGNOSIS — F329 Major depressive disorder, single episode, unspecified: Secondary | ICD-10-CM | POA: Diagnosis not present

## 2018-12-20 DIAGNOSIS — N390 Urinary tract infection, site not specified: Secondary | ICD-10-CM | POA: Diagnosis not present

## 2018-12-20 DIAGNOSIS — Z741 Need for assistance with personal care: Secondary | ICD-10-CM | POA: Diagnosis not present

## 2018-12-20 DIAGNOSIS — E039 Hypothyroidism, unspecified: Secondary | ICD-10-CM | POA: Diagnosis not present

## 2018-12-23 ENCOUNTER — Ambulatory Visit (HOSPITAL_COMMUNITY): Payer: Medicare Other

## 2018-12-23 DIAGNOSIS — Z681 Body mass index (BMI) 19 or less, adult: Secondary | ICD-10-CM | POA: Diagnosis not present

## 2018-12-23 DIAGNOSIS — F329 Major depressive disorder, single episode, unspecified: Secondary | ICD-10-CM | POA: Diagnosis not present

## 2018-12-23 DIAGNOSIS — N390 Urinary tract infection, site not specified: Secondary | ICD-10-CM | POA: Diagnosis not present

## 2018-12-23 DIAGNOSIS — E039 Hypothyroidism, unspecified: Secondary | ICD-10-CM | POA: Diagnosis not present

## 2018-12-23 DIAGNOSIS — C719 Malignant neoplasm of brain, unspecified: Secondary | ICD-10-CM | POA: Diagnosis not present

## 2018-12-23 DIAGNOSIS — Z741 Need for assistance with personal care: Secondary | ICD-10-CM | POA: Diagnosis not present

## 2018-12-24 DIAGNOSIS — N390 Urinary tract infection, site not specified: Secondary | ICD-10-CM | POA: Diagnosis not present

## 2018-12-24 DIAGNOSIS — C719 Malignant neoplasm of brain, unspecified: Secondary | ICD-10-CM | POA: Diagnosis not present

## 2018-12-24 DIAGNOSIS — Z741 Need for assistance with personal care: Secondary | ICD-10-CM | POA: Diagnosis not present

## 2018-12-24 DIAGNOSIS — Z681 Body mass index (BMI) 19 or less, adult: Secondary | ICD-10-CM | POA: Diagnosis not present

## 2018-12-24 DIAGNOSIS — E039 Hypothyroidism, unspecified: Secondary | ICD-10-CM | POA: Diagnosis not present

## 2018-12-24 DIAGNOSIS — F329 Major depressive disorder, single episode, unspecified: Secondary | ICD-10-CM | POA: Diagnosis not present

## 2018-12-25 ENCOUNTER — Telehealth: Payer: Self-pay | Admitting: *Deleted

## 2018-12-25 DIAGNOSIS — C719 Malignant neoplasm of brain, unspecified: Secondary | ICD-10-CM | POA: Diagnosis not present

## 2018-12-25 DIAGNOSIS — E039 Hypothyroidism, unspecified: Secondary | ICD-10-CM | POA: Diagnosis not present

## 2018-12-25 DIAGNOSIS — N390 Urinary tract infection, site not specified: Secondary | ICD-10-CM | POA: Diagnosis not present

## 2018-12-25 DIAGNOSIS — Z681 Body mass index (BMI) 19 or less, adult: Secondary | ICD-10-CM | POA: Diagnosis not present

## 2018-12-25 DIAGNOSIS — Z741 Need for assistance with personal care: Secondary | ICD-10-CM | POA: Diagnosis not present

## 2018-12-25 DIAGNOSIS — F329 Major depressive disorder, single episode, unspecified: Secondary | ICD-10-CM | POA: Diagnosis not present

## 2018-12-25 NOTE — Telephone Encounter (Signed)
Abigail Butts from [employer] called to let Dr Mickeal Skinner know that Marie Haynes has decided to be a DNR.

## 2018-12-26 DIAGNOSIS — E039 Hypothyroidism, unspecified: Secondary | ICD-10-CM | POA: Diagnosis not present

## 2018-12-26 DIAGNOSIS — C719 Malignant neoplasm of brain, unspecified: Secondary | ICD-10-CM | POA: Diagnosis not present

## 2018-12-26 DIAGNOSIS — N390 Urinary tract infection, site not specified: Secondary | ICD-10-CM | POA: Diagnosis not present

## 2018-12-26 DIAGNOSIS — F329 Major depressive disorder, single episode, unspecified: Secondary | ICD-10-CM | POA: Diagnosis not present

## 2018-12-26 DIAGNOSIS — Z741 Need for assistance with personal care: Secondary | ICD-10-CM | POA: Diagnosis not present

## 2018-12-26 DIAGNOSIS — Z681 Body mass index (BMI) 19 or less, adult: Secondary | ICD-10-CM | POA: Diagnosis not present

## 2018-12-30 ENCOUNTER — Other Ambulatory Visit: Payer: Self-pay | Admitting: Internal Medicine

## 2018-12-30 DIAGNOSIS — C719 Malignant neoplasm of brain, unspecified: Secondary | ICD-10-CM | POA: Diagnosis not present

## 2018-12-30 DIAGNOSIS — E039 Hypothyroidism, unspecified: Secondary | ICD-10-CM | POA: Diagnosis not present

## 2018-12-30 DIAGNOSIS — Z741 Need for assistance with personal care: Secondary | ICD-10-CM | POA: Diagnosis not present

## 2018-12-30 DIAGNOSIS — N390 Urinary tract infection, site not specified: Secondary | ICD-10-CM | POA: Diagnosis not present

## 2018-12-30 DIAGNOSIS — Z7189 Other specified counseling: Secondary | ICD-10-CM

## 2018-12-30 DIAGNOSIS — F329 Major depressive disorder, single episode, unspecified: Secondary | ICD-10-CM | POA: Diagnosis not present

## 2018-12-30 DIAGNOSIS — Z681 Body mass index (BMI) 19 or less, adult: Secondary | ICD-10-CM | POA: Diagnosis not present

## 2019-01-02 DIAGNOSIS — Z681 Body mass index (BMI) 19 or less, adult: Secondary | ICD-10-CM | POA: Diagnosis not present

## 2019-01-02 DIAGNOSIS — Z741 Need for assistance with personal care: Secondary | ICD-10-CM | POA: Diagnosis not present

## 2019-01-02 DIAGNOSIS — N390 Urinary tract infection, site not specified: Secondary | ICD-10-CM | POA: Diagnosis not present

## 2019-01-02 DIAGNOSIS — E039 Hypothyroidism, unspecified: Secondary | ICD-10-CM | POA: Diagnosis not present

## 2019-01-02 DIAGNOSIS — F329 Major depressive disorder, single episode, unspecified: Secondary | ICD-10-CM | POA: Diagnosis not present

## 2019-01-02 DIAGNOSIS — C719 Malignant neoplasm of brain, unspecified: Secondary | ICD-10-CM | POA: Diagnosis not present

## 2019-01-04 DIAGNOSIS — N390 Urinary tract infection, site not specified: Secondary | ICD-10-CM | POA: Diagnosis not present

## 2019-01-04 DIAGNOSIS — F329 Major depressive disorder, single episode, unspecified: Secondary | ICD-10-CM | POA: Diagnosis not present

## 2019-01-04 DIAGNOSIS — C719 Malignant neoplasm of brain, unspecified: Secondary | ICD-10-CM | POA: Diagnosis not present

## 2019-01-04 DIAGNOSIS — Z681 Body mass index (BMI) 19 or less, adult: Secondary | ICD-10-CM | POA: Diagnosis not present

## 2019-01-04 DIAGNOSIS — Z741 Need for assistance with personal care: Secondary | ICD-10-CM | POA: Diagnosis not present

## 2019-01-04 DIAGNOSIS — E039 Hypothyroidism, unspecified: Secondary | ICD-10-CM | POA: Diagnosis not present

## 2019-01-06 ENCOUNTER — Other Ambulatory Visit: Payer: Self-pay | Admitting: Internal Medicine

## 2019-01-06 DIAGNOSIS — N390 Urinary tract infection, site not specified: Secondary | ICD-10-CM | POA: Diagnosis not present

## 2019-01-06 DIAGNOSIS — C719 Malignant neoplasm of brain, unspecified: Secondary | ICD-10-CM | POA: Diagnosis not present

## 2019-01-06 DIAGNOSIS — E039 Hypothyroidism, unspecified: Secondary | ICD-10-CM | POA: Diagnosis not present

## 2019-01-06 DIAGNOSIS — F329 Major depressive disorder, single episode, unspecified: Secondary | ICD-10-CM | POA: Diagnosis not present

## 2019-01-06 DIAGNOSIS — Z741 Need for assistance with personal care: Secondary | ICD-10-CM | POA: Diagnosis not present

## 2019-01-06 DIAGNOSIS — Z681 Body mass index (BMI) 19 or less, adult: Secondary | ICD-10-CM | POA: Diagnosis not present

## 2019-01-06 NOTE — Telephone Encounter (Signed)
See request below

## 2019-01-07 DIAGNOSIS — N390 Urinary tract infection, site not specified: Secondary | ICD-10-CM | POA: Diagnosis not present

## 2019-01-07 DIAGNOSIS — F329 Major depressive disorder, single episode, unspecified: Secondary | ICD-10-CM | POA: Diagnosis not present

## 2019-01-07 DIAGNOSIS — Z741 Need for assistance with personal care: Secondary | ICD-10-CM | POA: Diagnosis not present

## 2019-01-07 DIAGNOSIS — Z681 Body mass index (BMI) 19 or less, adult: Secondary | ICD-10-CM | POA: Diagnosis not present

## 2019-01-07 DIAGNOSIS — E039 Hypothyroidism, unspecified: Secondary | ICD-10-CM | POA: Diagnosis not present

## 2019-01-07 DIAGNOSIS — C719 Malignant neoplasm of brain, unspecified: Secondary | ICD-10-CM | POA: Diagnosis not present

## 2019-01-08 DIAGNOSIS — Z681 Body mass index (BMI) 19 or less, adult: Secondary | ICD-10-CM | POA: Diagnosis not present

## 2019-01-08 DIAGNOSIS — Z741 Need for assistance with personal care: Secondary | ICD-10-CM | POA: Diagnosis not present

## 2019-01-08 DIAGNOSIS — F329 Major depressive disorder, single episode, unspecified: Secondary | ICD-10-CM | POA: Diagnosis not present

## 2019-01-08 DIAGNOSIS — N390 Urinary tract infection, site not specified: Secondary | ICD-10-CM | POA: Diagnosis not present

## 2019-01-08 DIAGNOSIS — E039 Hypothyroidism, unspecified: Secondary | ICD-10-CM | POA: Diagnosis not present

## 2019-01-08 DIAGNOSIS — C719 Malignant neoplasm of brain, unspecified: Secondary | ICD-10-CM | POA: Diagnosis not present

## 2019-01-09 ENCOUNTER — Inpatient Hospital Stay: Payer: Medicare Other

## 2019-01-09 ENCOUNTER — Inpatient Hospital Stay: Payer: Medicare Other | Admitting: Internal Medicine

## 2019-01-10 DIAGNOSIS — F329 Major depressive disorder, single episode, unspecified: Secondary | ICD-10-CM | POA: Diagnosis not present

## 2019-01-10 DIAGNOSIS — C719 Malignant neoplasm of brain, unspecified: Secondary | ICD-10-CM | POA: Diagnosis not present

## 2019-01-10 DIAGNOSIS — N390 Urinary tract infection, site not specified: Secondary | ICD-10-CM | POA: Diagnosis not present

## 2019-01-10 DIAGNOSIS — Z681 Body mass index (BMI) 19 or less, adult: Secondary | ICD-10-CM | POA: Diagnosis not present

## 2019-01-10 DIAGNOSIS — E039 Hypothyroidism, unspecified: Secondary | ICD-10-CM | POA: Diagnosis not present

## 2019-01-10 DIAGNOSIS — Z741 Need for assistance with personal care: Secondary | ICD-10-CM | POA: Diagnosis not present

## 2019-01-12 DIAGNOSIS — F329 Major depressive disorder, single episode, unspecified: Secondary | ICD-10-CM | POA: Diagnosis not present

## 2019-01-12 DIAGNOSIS — N390 Urinary tract infection, site not specified: Secondary | ICD-10-CM | POA: Diagnosis not present

## 2019-01-12 DIAGNOSIS — Z741 Need for assistance with personal care: Secondary | ICD-10-CM | POA: Diagnosis not present

## 2019-01-12 DIAGNOSIS — C719 Malignant neoplasm of brain, unspecified: Secondary | ICD-10-CM | POA: Diagnosis not present

## 2019-01-12 DIAGNOSIS — Z681 Body mass index (BMI) 19 or less, adult: Secondary | ICD-10-CM | POA: Diagnosis not present

## 2019-01-12 DIAGNOSIS — E039 Hypothyroidism, unspecified: Secondary | ICD-10-CM | POA: Diagnosis not present

## 2019-01-17 ENCOUNTER — Telehealth: Payer: Self-pay

## 2019-01-17 NOTE — Telephone Encounter (Signed)
Oral Oncology Pharmacist Encounter  Marie Haynes returned call to oral oncology clinic. Marie Haynes passed on February 06, 2019. The pharmacy has been updated.  Marie Haynes will reach out to the office if he needs anything in the future. He appreciates all the the office and Dr. Mickeal Skinner has done to support him and his family during these difficult times.  Marie Haynes, PharmD, BCPS, BCOP  01/17/2019 10:07 AM Oral Oncology Clinic [phone]

## 2019-01-17 NOTE — Telephone Encounter (Signed)
Oral Oncology Patient Advocate Encounter  [facility] has attempted to reach [facility] 3 times to refill Temodar with no success.  I called Rolan Bucco today and had to leave a voicemail.  Marie Haynes Phone [phone] Fax [phone] 01/17/2019   9:51 AM

## 2019-01-20 ENCOUNTER — Telehealth: Payer: Self-pay

## 2019-01-20 NOTE — Telephone Encounter (Signed)
Copied from [place] [phone]. Topic: General - Deceased Patient >> 02-16-19  9:39 AM Reyne Dumas L wrote: Reason for CRM:   Pt's spouse calling to report pt's death on 02/08/2019.  States that he wants to thank Dr. Larose Kells for finding her brain cancer.    Route to department's PEC Pool.

## 2019-01-20 NOTE — Telephone Encounter (Signed)
I am very sad about his loss.  He actually is establishing with the practice, condolences will provide at the time

## 2019-02-04 DEATH — deceased

## 2019-03-04 ENCOUNTER — Encounter: Payer: Medicare Other | Admitting: Internal Medicine

## 2020-04-07 IMAGING — MR MR HEAD WO/W CM
12 of 14 series · 31 of 48 positions shown · IV contrast (gadavist)
Comparison: Brain MRI 03/15/2018

CLINICAL DATA: Status post right temporal mass resection.

EXAM:
MRI HEAD WITHOUT AND WITH CONTRAST
TECHNIQUE: Multiplanar, multiecho pulse sequences of the brain and surrounding
structures were obtained without and with intravenous contrast.
CONTRAST:  5.5 mL Gadavist

[Series 3: DWI · axial · 3.0mm · 0.94mm/px · z∈[-47,+99]mm · 5 of 99 slices shown (1 of 2)]
[im 1/99]
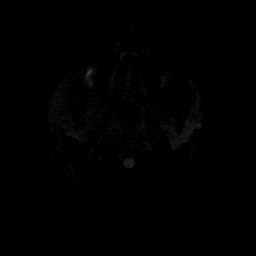
[im 25/99]
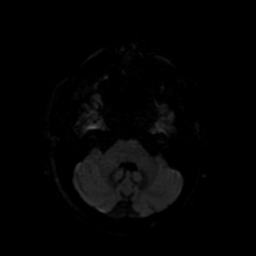
[im 50/99]
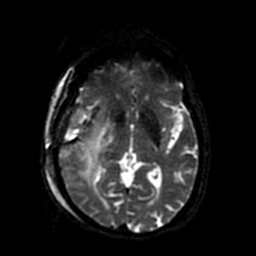
[im 74/99]
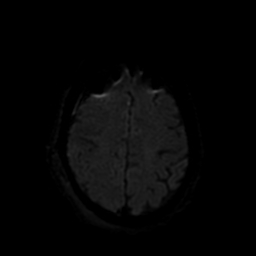
[im 99/99]
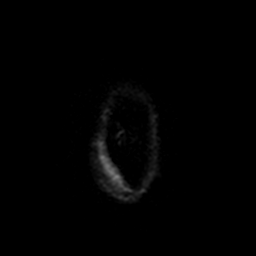

[Series 4: DWI · coronal · 4.0mm · 0.94mm/px · 4 of 70 slices shown (2 of 2)]
[im 1/70]
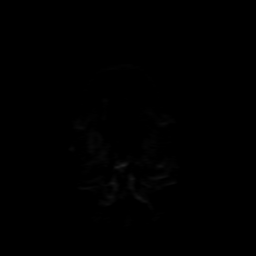
[im 24/70]
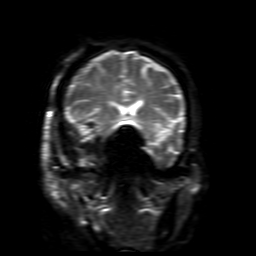
[im 47/70]
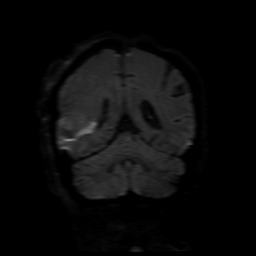
[im 70/70]
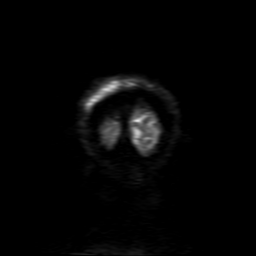

[Series 5: FLAIR · sagittal · 5.0mm · 0.47mm/px · 1 of 24 slices shown (1 of 2)]
[im 1/24]
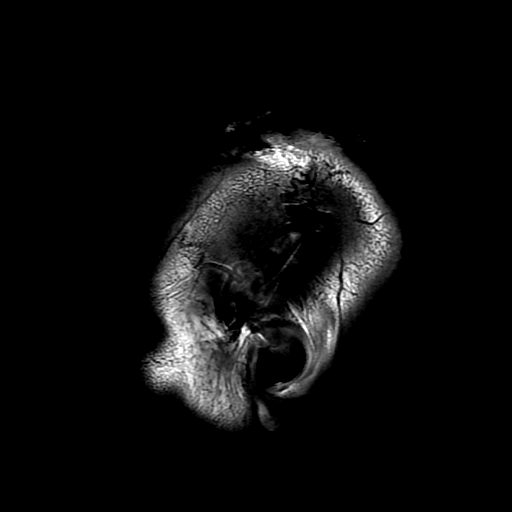

[Series 6: T2 · axial · 5.0mm · 0.47mm/px · 1 of 27 slices shown]
[im 1/27]
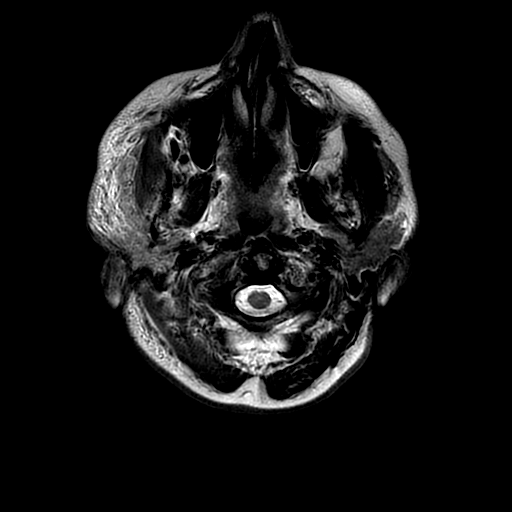

[Series 7: FLAIR · axial · 5.0mm · 0.47mm/px · 1 of 27 slices shown (2 of 2)]
[im 1/27]
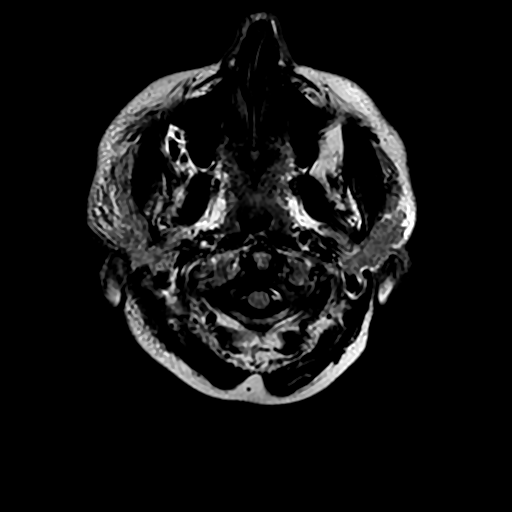

[Series 8: (person_name) · axial · 3.0mm · 0.47mm/px · z∈[-42,+106]mm · 5 of 100 slices shown]
[im 1/100]
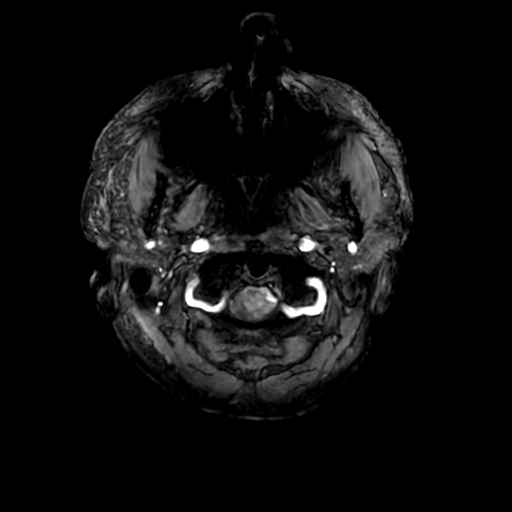
[im 25/100]
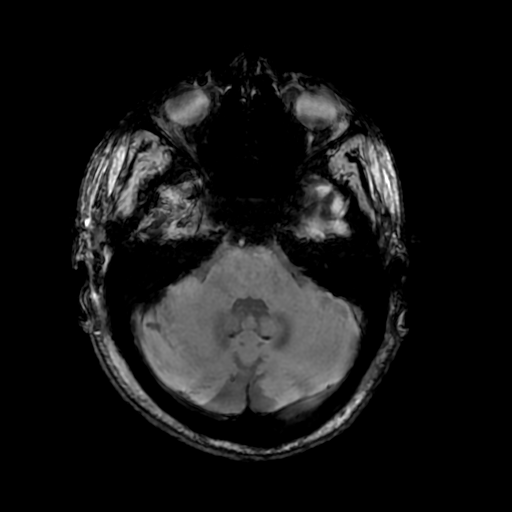
[im 50/100]
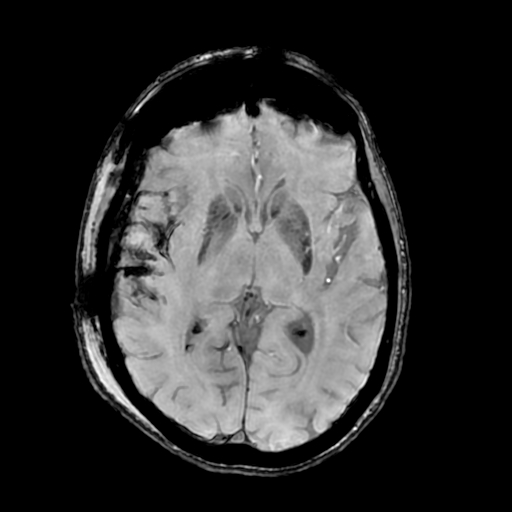
[im 75/100]
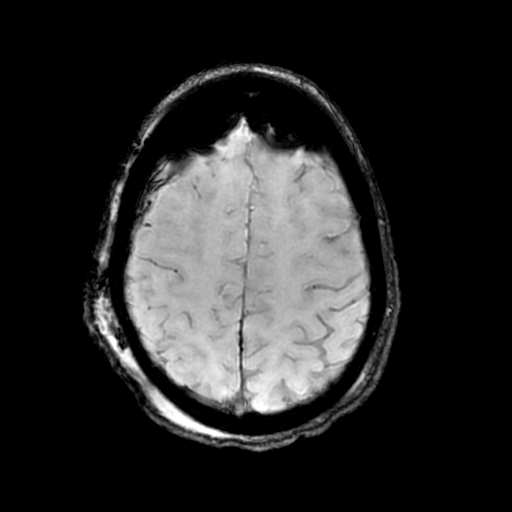
[im 100/100]
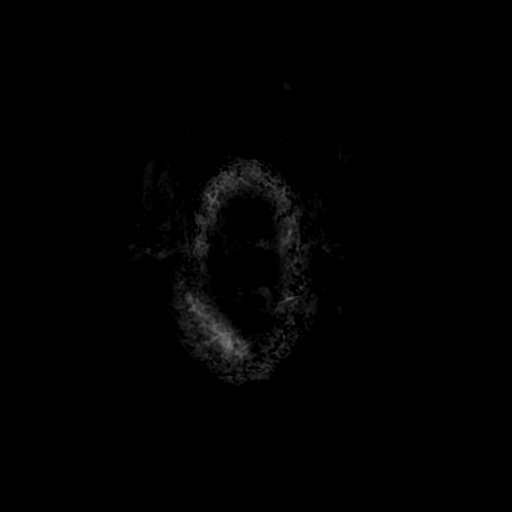

[Series 9: ax 3(person_name) · axial · 1.0mm · 0.94mm/px · z∈[-42,+27]mm · 4 of 162 slices shown]
[im 1/162]
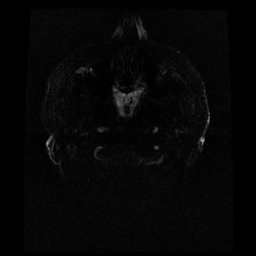
[im 24/162]
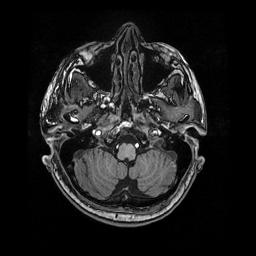
[im 47/162]
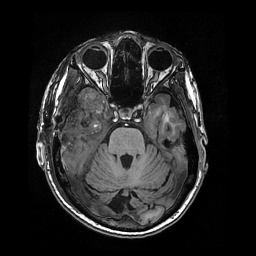
[im 70/162]
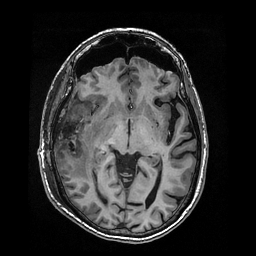

[Series 10: T2 post-contrast · coronal · 5.0mm · 0.39mm/px · 2 of 30 slices shown]
[im 1/30]
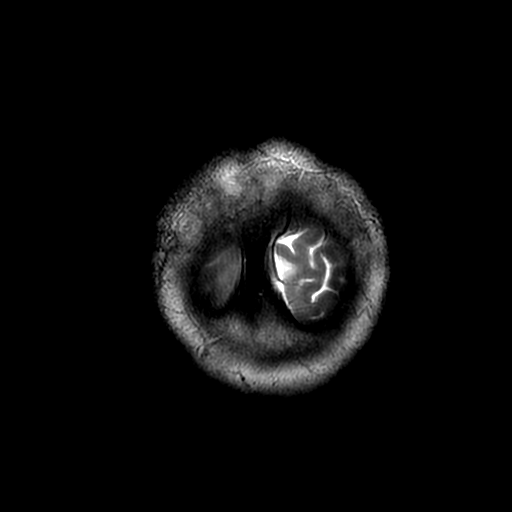
[im 30/30]
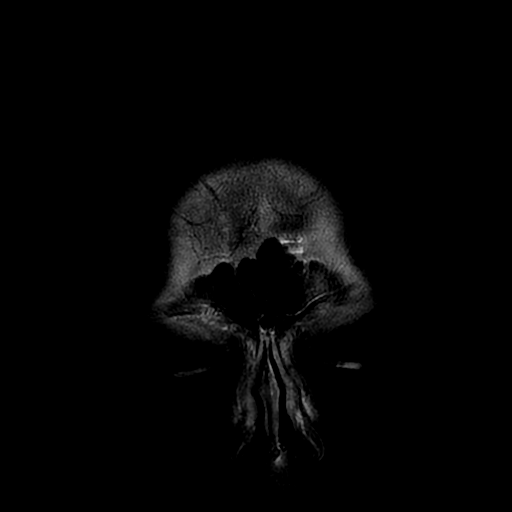

[Series 12: T1 · coronal · 5.0mm · 0.43mm/px · 2 of 30 slices shown (1 of 2)]
[im 1/30]
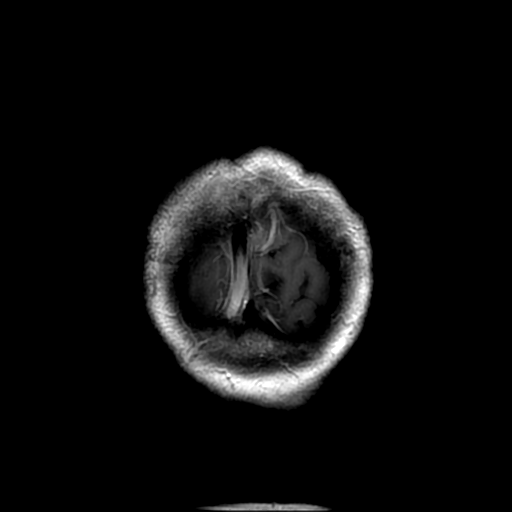
[im 30/30]
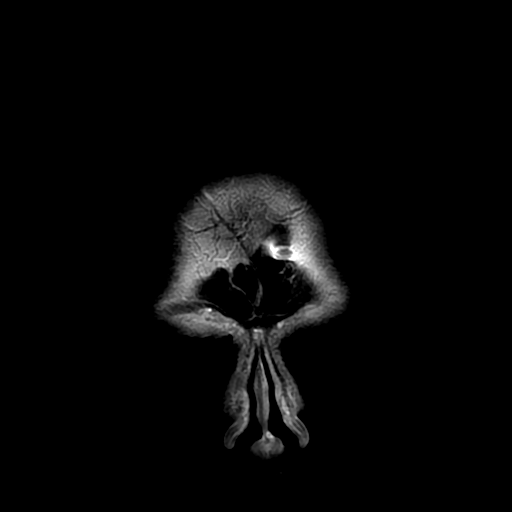

[Series 13: T1 · sagittal · 5.0mm · 0.43mm/px · 1 of 24 slices shown (2 of 2)]
[im 1/24]
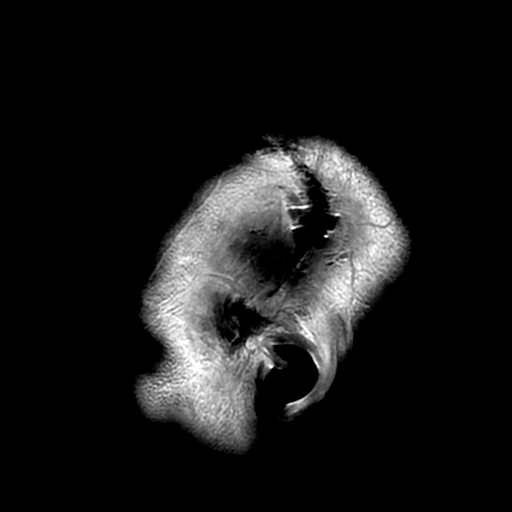

[Series 350: ADC · axial · 3.0mm · 0.94mm/px · z∈[-47,+99]mm · 3 of 50 slices shown (1 of 2)]
[im 1/50]
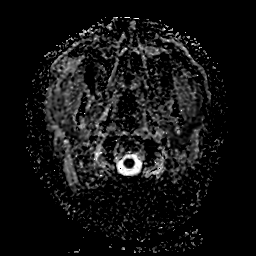
[im 25/50]
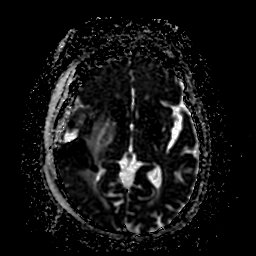
[im 50/50]
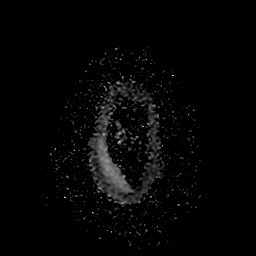

[Series 450: ADC · coronal · 4.0mm · 0.94mm/px · 2 of 35 slices shown (2 of 2)]
[im 1/35]
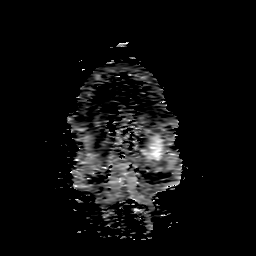
[im 35/35]
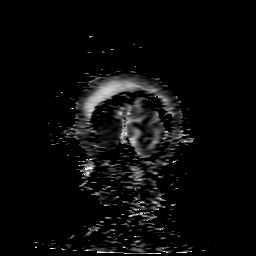

[31 of 48 positions shown; findings below may reference images not displayed]

FINDINGS: BRAIN: Status post resection of tumor of the right anterior temporal
lobe. There is magnetic susceptibility effects and abnormal
diffusion within the cavity, consistent with postoperative blood
products. There is a wedge-shaped area of diffusion restriction
extending posteriorly and medially toward the atrium of the right
lateral ventricle. There is a large amount of anterior hydrocephalus
with mild mass effect on the frontal poles. The extent of the
surrounding hyperintense T2-weighted signal is unchanged. Mass
effect on the ventricles has improved. There is a focus of chronic
microhemorrhage adjacent to the occipital horn of the left lateral
ventricle.

At the resection site, there is somewhat nodular contrast
enhancement contiguous with the dura at the most anterior margin
friend image 53 of the axial postcontrast T1-weighted sequence.).
There are other mildly nodular areas of enhancement along the medial
superior margin (series 11, images 65 and 66). Just anterior to the
distal M1 segment of the right middle cerebral artery is a small
focus of contrast enhancement (series 11, image 63). No remote
contrast-enhancement.

VASCULAR: Major intracranial arterial and venous sinus flow voids
are normal.

SKULL AND UPPER CERVICAL SPINE: Recent right pterional craniotomy.
Small overlying subgaleal hematoma.

SINUSES/ORBITS: No fluid levels or advanced mucosal thickening. No
mastoid or middle ear effusion. There are bilateral lens
replacements.
IMPRESSION: 1. Status post resection of right temporal lobe tumor. This will
serve as a baseline for future studies.
2. Areas of somewhat nodular contrast enhancement along the anterior
and medial resection margins are nonspecific at this time, but
warrant close attention for the presence of residual tumor on the
follow-up study.
3. Wedge-shaped area diffusion restriction extending dorsally and
medially from the resection cavity, consistent with acute ischemia.
Please note that on follow-up studies, this area may show contrast
enhancement.
4. Postoperative findings including small subgaleal hematoma and
moderate pneumocephalus.
